# Patient Record
Sex: Female | Born: 1935 | Race: White | Hispanic: No | Marital: Married | Smoking: Never smoker
Health system: Southern US, Community
[De-identification: ages and names within clinical notes are randomized; demographics above are authoritative.]

## PROBLEM LIST (undated history)

## (undated) DIAGNOSIS — I82409 Acute embolism and thrombosis of unspecified deep veins of unspecified lower extremity: Secondary | ICD-10-CM

## (undated) DIAGNOSIS — K449 Diaphragmatic hernia without obstruction or gangrene: Secondary | ICD-10-CM

## (undated) DIAGNOSIS — M1990 Unspecified osteoarthritis, unspecified site: Secondary | ICD-10-CM

## (undated) DIAGNOSIS — E119 Type 2 diabetes mellitus without complications: Secondary | ICD-10-CM

## (undated) DIAGNOSIS — M810 Age-related osteoporosis without current pathological fracture: Secondary | ICD-10-CM

## (undated) DIAGNOSIS — H409 Unspecified glaucoma: Secondary | ICD-10-CM

## (undated) DIAGNOSIS — C50919 Malignant neoplasm of unspecified site of unspecified female breast: Secondary | ICD-10-CM

## (undated) DIAGNOSIS — K259 Gastric ulcer, unspecified as acute or chronic, without hemorrhage or perforation: Secondary | ICD-10-CM

## (undated) DIAGNOSIS — I10 Essential (primary) hypertension: Secondary | ICD-10-CM

## (undated) DIAGNOSIS — C4491 Basal cell carcinoma of skin, unspecified: Secondary | ICD-10-CM

## (undated) HISTORY — PX: PARTIAL COLECTOMY: SHX5273

## (undated) HISTORY — PX: REPLACEMENT TOTAL KNEE: SUR1224

## (undated) HISTORY — DX: Diaphragmatic hernia without obstruction or gangrene: K44.9

## (undated) HISTORY — DX: Age-related osteoporosis without current pathological fracture: M81.0

## (undated) HISTORY — PX: TONSILLECTOMY: SUR1361

## (undated) HISTORY — PX: APPENDECTOMY: SHX54

## (undated) HISTORY — DX: Type 2 diabetes mellitus without complications: E11.9

## (undated) HISTORY — DX: Basal cell carcinoma of skin, unspecified: C44.91

## (undated) HISTORY — DX: Malignant neoplasm of unspecified site of unspecified female breast: C50.919

## (undated) HISTORY — DX: Gastric ulcer, unspecified as acute or chronic, without hemorrhage or perforation: K25.9

## (undated) HISTORY — DX: Unspecified glaucoma: H40.9

## (undated) HISTORY — DX: Unspecified osteoarthritis, unspecified site: M19.90

## (undated) HISTORY — DX: Essential (primary) hypertension: I10

---

## 2004-04-29 ENCOUNTER — Ambulatory Visit: Payer: Self-pay | Admitting: Internal Medicine

## 2004-05-01 ENCOUNTER — Ambulatory Visit: Payer: Self-pay | Admitting: Internal Medicine

## 2004-05-13 ENCOUNTER — Ambulatory Visit: Payer: Self-pay | Admitting: Internal Medicine

## 2004-06-17 ENCOUNTER — Ambulatory Visit: Payer: Self-pay | Admitting: Internal Medicine

## 2004-07-13 ENCOUNTER — Ambulatory Visit: Payer: Self-pay | Admitting: Internal Medicine

## 2005-05-11 ENCOUNTER — Ambulatory Visit: Payer: Self-pay | Admitting: Internal Medicine

## 2006-05-12 ENCOUNTER — Ambulatory Visit: Payer: Self-pay | Admitting: Internal Medicine

## 2006-05-25 ENCOUNTER — Ambulatory Visit: Payer: Self-pay

## 2006-08-05 ENCOUNTER — Ambulatory Visit: Payer: Self-pay | Admitting: Gastroenterology

## 2006-09-08 ENCOUNTER — Inpatient Hospital Stay: Payer: Self-pay | Admitting: Surgery

## 2006-09-10 ENCOUNTER — Other Ambulatory Visit: Payer: Self-pay

## 2006-10-15 ENCOUNTER — Inpatient Hospital Stay: Payer: Self-pay | Admitting: Internal Medicine

## 2006-10-15 ENCOUNTER — Other Ambulatory Visit: Payer: Self-pay

## 2006-10-15 ENCOUNTER — Ambulatory Visit: Payer: Self-pay | Admitting: Internal Medicine

## 2006-12-28 ENCOUNTER — Ambulatory Visit: Payer: Self-pay | Admitting: Emergency Medicine

## 2007-05-17 ENCOUNTER — Ambulatory Visit: Payer: Self-pay | Admitting: Internal Medicine

## 2008-01-09 ENCOUNTER — Ambulatory Visit: Payer: Self-pay | Admitting: Family Medicine

## 2008-04-04 ENCOUNTER — Observation Stay: Payer: Self-pay | Admitting: Internal Medicine

## 2008-09-06 ENCOUNTER — Ambulatory Visit: Payer: Self-pay | Admitting: Internal Medicine

## 2009-09-26 ENCOUNTER — Ambulatory Visit: Payer: Self-pay | Admitting: Internal Medicine

## 2010-11-07 ENCOUNTER — Ambulatory Visit: Payer: Self-pay | Admitting: Unknown Physician Specialty

## 2010-11-19 ENCOUNTER — Ambulatory Visit: Payer: Self-pay | Admitting: Internal Medicine

## 2011-07-14 HISTORY — PX: COLONOSCOPY: SHX174

## 2011-09-25 DIAGNOSIS — M1991 Primary osteoarthritis, unspecified site: Secondary | ICD-10-CM | POA: Insufficient documentation

## 2011-12-25 ENCOUNTER — Ambulatory Visit: Payer: Self-pay | Admitting: Internal Medicine

## 2012-02-10 ENCOUNTER — Ambulatory Visit: Payer: Self-pay | Admitting: Ophthalmology

## 2012-02-10 LAB — PROTIME-INR: Prothrombin Time: 17.3 secs — ABNORMAL HIGH (ref 11.5–14.7)

## 2012-03-07 ENCOUNTER — Ambulatory Visit: Payer: Self-pay | Admitting: Ophthalmology

## 2012-05-18 ENCOUNTER — Ambulatory Visit: Payer: Self-pay | Admitting: Gastroenterology

## 2012-07-13 DIAGNOSIS — C50919 Malignant neoplasm of unspecified site of unspecified female breast: Secondary | ICD-10-CM

## 2012-07-13 HISTORY — DX: Malignant neoplasm of unspecified site of unspecified female breast: C50.919

## 2012-07-13 HISTORY — PX: BREAST BIOPSY: SHX20

## 2012-09-20 ENCOUNTER — Ambulatory Visit: Payer: Self-pay | Admitting: Ophthalmology

## 2012-09-20 LAB — PROTIME-INR
INR: 1.7
Prothrombin Time: 20.1 secs — ABNORMAL HIGH (ref 11.5–14.7)

## 2012-10-03 ENCOUNTER — Ambulatory Visit: Payer: Self-pay | Admitting: Ophthalmology

## 2012-12-08 ENCOUNTER — Ambulatory Visit: Payer: Self-pay | Admitting: Internal Medicine

## 2012-12-12 ENCOUNTER — Ambulatory Visit: Payer: Self-pay | Admitting: Internal Medicine

## 2013-01-06 ENCOUNTER — Ambulatory Visit: Payer: Self-pay | Admitting: Surgery

## 2013-01-06 LAB — PROTIME-INR
INR: 1
Prothrombin Time: 13.7 secs (ref 11.5–14.7)

## 2013-01-10 LAB — PATHOLOGY REPORT

## 2013-01-26 ENCOUNTER — Ambulatory Visit: Payer: Self-pay | Admitting: Surgery

## 2013-01-26 DIAGNOSIS — Z01810 Encounter for preprocedural cardiovascular examination: Secondary | ICD-10-CM

## 2013-01-26 LAB — BASIC METABOLIC PANEL
Anion Gap: 9 (ref 7–16)
Calcium, Total: 10 mg/dL (ref 8.5–10.1)
Co2: 26 mmol/L (ref 21–32)
EGFR (African American): 46 — ABNORMAL LOW
EGFR (Non-African Amer.): 40 — ABNORMAL LOW

## 2013-01-26 LAB — CBC WITH DIFFERENTIAL/PLATELET
Basophil #: 0.1 10*3/uL (ref 0.0–0.1)
Basophil %: 1.1 %
HCT: 37.4 % (ref 35.0–47.0)
HGB: 12.1 g/dL (ref 12.0–16.0)
MCH: 29.1 pg (ref 26.0–34.0)
MCV: 90 fL (ref 80–100)
Monocyte %: 6.8 %
Neutrophil #: 6.2 10*3/uL (ref 1.4–6.5)
Platelet: 245 10*3/uL (ref 150–440)
RDW: 14.6 % — ABNORMAL HIGH (ref 11.5–14.5)
WBC: 8.9 10*3/uL (ref 3.6–11.0)

## 2013-01-26 LAB — PROTIME-INR
INR: 2
Prothrombin Time: 22.7 secs — ABNORMAL HIGH (ref 11.5–14.7)

## 2013-01-31 ENCOUNTER — Ambulatory Visit: Payer: Self-pay | Admitting: Oncology

## 2013-02-02 ENCOUNTER — Ambulatory Visit: Payer: Self-pay | Admitting: Surgery

## 2013-02-02 LAB — PROTIME-INR: INR: 1.1

## 2013-02-03 ENCOUNTER — Ambulatory Visit: Payer: Self-pay | Admitting: Oncology

## 2013-02-10 ENCOUNTER — Ambulatory Visit: Payer: Self-pay | Admitting: Oncology

## 2013-03-13 ENCOUNTER — Ambulatory Visit: Payer: Self-pay | Admitting: Oncology

## 2013-03-31 LAB — CBC CANCER CENTER
Basophil #: 0.1 x10 3/mm (ref 0.0–0.1)
Eosinophil #: 0.2 x10 3/mm (ref 0.0–0.7)
HGB: 13.1 g/dL (ref 12.0–16.0)
MCHC: 34.1 g/dL (ref 32.0–36.0)
MCV: 90 fL (ref 80–100)
Monocyte #: 0.6 x10 3/mm (ref 0.2–0.9)
Monocyte %: 8.2 %
Neutrophil %: 67.8 %
Platelet: 222 x10 3/mm (ref 150–440)
RBC: 4.27 10*6/uL (ref 3.80–5.20)
RDW: 14.6 % — ABNORMAL HIGH (ref 11.5–14.5)

## 2013-04-07 LAB — CBC CANCER CENTER
Basophil #: 0.1 x10 3/mm (ref 0.0–0.1)
Basophil %: 0.9 %
Eosinophil #: 0.5 x10 3/mm (ref 0.0–0.7)
Eosinophil %: 4.9 %
HGB: 13.4 g/dL (ref 12.0–16.0)
Lymphocyte #: 2.1 x10 3/mm (ref 1.0–3.6)
Lymphocyte %: 19.9 %
MCHC: 33.4 g/dL (ref 32.0–36.0)
MCV: 92 fL (ref 80–100)
Monocyte #: 0.9 x10 3/mm (ref 0.2–0.9)
Neutrophil #: 7.2 x10 3/mm — ABNORMAL HIGH (ref 1.4–6.5)
Platelet: 246 x10 3/mm (ref 150–440)
RBC: 4.37 10*6/uL (ref 3.80–5.20)
RDW: 14.5 % (ref 11.5–14.5)
WBC: 10.8 x10 3/mm (ref 3.6–11.0)

## 2013-04-12 ENCOUNTER — Ambulatory Visit: Payer: Self-pay | Admitting: Oncology

## 2013-04-14 LAB — CBC CANCER CENTER
Basophil #: 0.1 x10 3/mm (ref 0.0–0.1)
Basophil %: 1.1 %
Eosinophil %: 4.2 %
HGB: 13.3 g/dL (ref 12.0–16.0)
Lymphocyte #: 1.7 x10 3/mm (ref 1.0–3.6)
Lymphocyte %: 20.2 %
MCH: 30.8 pg (ref 26.0–34.0)
MCHC: 33.7 g/dL (ref 32.0–36.0)
MCV: 91 fL (ref 80–100)
Monocyte #: 0.7 x10 3/mm (ref 0.2–0.9)
Monocyte %: 8.2 %
Neutrophil #: 5.5 x10 3/mm (ref 1.4–6.5)
Platelet: 254 x10 3/mm (ref 150–440)
RBC: 4.31 10*6/uL (ref 3.80–5.20)
RDW: 15.2 % — ABNORMAL HIGH (ref 11.5–14.5)

## 2013-04-21 LAB — CBC CANCER CENTER
Basophil #: 0.1 x10 3/mm (ref 0.0–0.1)
MCH: 31.4 pg (ref 26.0–34.0)
Monocyte %: 8.8 %
Neutrophil #: 6.5 x10 3/mm (ref 1.4–6.5)
Neutrophil %: 70.1 %
Platelet: 233 x10 3/mm (ref 150–440)
RBC: 4.23 10*6/uL (ref 3.80–5.20)

## 2013-05-13 ENCOUNTER — Ambulatory Visit: Payer: Self-pay | Admitting: Oncology

## 2013-06-12 ENCOUNTER — Ambulatory Visit: Payer: Self-pay | Admitting: Oncology

## 2013-08-23 ENCOUNTER — Ambulatory Visit: Payer: Self-pay | Admitting: Oncology

## 2013-09-10 ENCOUNTER — Ambulatory Visit: Payer: Self-pay | Admitting: Oncology

## 2014-01-30 ENCOUNTER — Ambulatory Visit: Payer: Self-pay | Admitting: Radiation Oncology

## 2014-02-16 ENCOUNTER — Ambulatory Visit: Payer: Self-pay | Admitting: Radiation Oncology

## 2014-05-15 ENCOUNTER — Ambulatory Visit: Payer: Self-pay | Admitting: Oncology

## 2014-06-12 ENCOUNTER — Ambulatory Visit: Payer: Self-pay | Admitting: Oncology

## 2014-08-01 ENCOUNTER — Ambulatory Visit: Payer: Self-pay | Admitting: Oncology

## 2014-08-23 ENCOUNTER — Ambulatory Visit: Payer: Self-pay | Admitting: Oncology

## 2014-09-11 ENCOUNTER — Ambulatory Visit
Admit: 2014-09-11 | Disposition: A | Discharge status: Home / Self Care | Payer: Self-pay | Attending: Oncology | Admitting: Oncology

## 2014-10-30 NOTE — Op Note (Signed)
PATIENT NAME:  [name], [name] MR#:  [id number] DATE OF BIRTH:  Feb 22, 1929  DATE OF PROCEDURE:  03/07/2012  PREOPERATIVE DIAGNOSIS: Cataract, left eye.   POSTOPERATIVE DIAGNOSIS: Cataract, left eye.   PROCEDURE PERFORMED: Extracapsular cataract extraction using phacoemulsification with placement Alcon [id number] 18.0-diopter posterior chamber lens with 4.5 diopters of cylinder, serial number [id number].    SURGEON: Loura Back. Jeniffer Culliver, MD    ANESTHESIA: 4% lidocaine and 0.75% Marcaine, 50-50 mixture with 10 units/mL of Hylenex added given as peribulbar.   ANESTHESIOLOGIST: Dr. Benjamine Mola    COMPLICATIONS: None.   ESTIMATED BLOOD LOSS: Less than 1 mL.   DESCRIPTION OF PROCEDURE: The patient was brought to the operating room and had eyes both anesthetized with topical proparacaine. Sitting upright and fixing at a distant target, the 3 and 9 o'clock positions were marked using an ASICO toric marker. The patient was placed supine, given IV sedation and peribulbar block. She was then prepped and draped in the usual fashion. Vertical rectus muscles were imbricated using 5-0 silk sutures, bridle sutures. A limbal peritomy was carried out for one clock hour at 12 o'clock and hemostasis was obtained with cautery. A partial thickness scleral groove was made at the posterior surgical limbus and dissected anteriorly into clear cornea with an Alcon crescent knife. The anterior chamber was entered superotemporally through clear cornea with a paracentesis knife and through the lamellar dissection with a 2.6 mm keratome. Prior to entering the eye, the toric marker was used to mark the 177 degree position. DisCoVisc was used to replace the aqueous and a continuous tear circular capsulorrhexis was carried out. Hydrodelineation was used to loosen the nucleus and phacoemulsification was carried out in a divide-and-conquer technique. Ultrasound time was 1 minute and 20 seconds with an average power of 20.9%, CDE of 28.52.  Irrigation-aspiration was used to remove the residual cortex. The capsular bag was inflated with DisCoVisc and the intraocular lens was inserted in the capsular bag using a [employer]. The haptics were lined up at the 177 degree mark and irrigation-aspiration was used to remove the residual DisCoVisc. The position of the lens was confirmed and the wound was inflated with balanced salt. Miostat was injected in the paracentesis track. There was a small leak at the wound and a single 10-0 nylon suture was placed across the wound. The knot was tied, rotated, and buried superiorly. The wound was checked again with a Weck-Cel and no leaks were found. The conjunctiva was closed with cautery. Bridle sutures were removed. Two drops of Vigamox were placed in the eye and a shield was placed on the eye. The patient was discharged to the recovery room in good condition.   ____________________________ Loura Back Norma Mincey, MD sad:drc D: 03/07/2012 13:44:11 ET T: 03/07/2012 13:53:07 ET JOB#: [id number]  cc: Remo Lipps A. Alainah Phang, MD, <Dictator> [name] MD ELECTRONICALLY SIGNED 03/11/2012 12:40

## 2014-11-02 NOTE — Op Note (Signed)
PATIENT NAME:  [name], [name] MR#:  [id number] DATE OF BIRTH:  1929/03/04  DATE OF PROCEDURE:  02/02/2013  PREOPERATIVE DIAGNOSIS: Right breast carcinoma.   POSTOPERATIVE DIAGNOSIS: Right breast carcinoma.  OPERATION:   1.  Right axillary sentinel lymph node biopsy.  2.  Right partial mastectomy.   SURGEON: Rodena Goldmann, M.D.   ASSISTANTGardiner Sleeper, PA student.   ANESTHESIA: General.   OPERATIVE PROCEDURE: With the patient in the supine position, after the induction of appropriate general anesthesia, the patient's right breast was prepped with ChloraPrep and draped with sterile towels. The axillary area was interrogated, and I could not identify any lymph nodes in that area with the Neoprobe. Then, we made an incision into the axilla and carried it down through the subcutaneous using Bovie electrocautery.   After entering the axilla, we did identify a focus of radioactivity. Careful dissection exposed 1 large lymph node, which was sent for sentinel lymph node biopsy. It returned negative for macro metastasis. The area was packed.   Attention was then turned to the breast where the area identified by the wire was ellipsed right up to the nipple itself. This was carried down around the wire and the wire identified without difficulty. The incision was taken down to the chest wall, passed off without difficulty. The area was irrigated. The incision was closed with interrupted vertical mattress sutures of 4-0 nylon. The axillary incision was closed in running subcuticular suture of 3-0 Vicryl, benzoin, and Steri-Strips. Wounds were dressed sterilely. The patient was returned to the recovery room having tolerated the procedure well. Sponge, instrument, and needle counts were correct x2 in the operating room.    ____________________________ Micheline Maze, MD rle:np D: 02/02/2013 13:42:00 ET T: 02/02/2013 15:17:54 ET JOB#: [id number]  cc: Dr. Oneita Jolly Rodena Goldmann III, MD,  <Dictator>    Rodena Goldmann MD ELECTRONICALLY SIGNED 02/03/2013 20:13

## 2014-11-02 NOTE — Consult Note (Signed)
Reason for Visit: This 81 year old Female patient presents to the clinic for initial evaluation of  breast cancer .   Referred by Dr. Grayland Ormond.  Diagnosis:  Chief Complaint/Diagnosis   81 year old female status post wide local excision of the right breast for 2 separate foci in the same quadrant overall stage I disease (T1 C. N0 M0) with a micrometastatic focus of disease in one of 3 sentinel lymph nodes tumor is ER/PR positive HER-2/neu not overexpressed margins clear but close  Pathology Report pathology report reviewed   Imaging Report mammograms and ultrasound reviewed   Referral Report clinical notes reviewed   Planned Treatment Regimen whole breast radiation   HPI   patient is an 81 year old obese female who presented with a self discovered nodularity in her left right breast. She underwent biopsy which was positive for invasive mammary carcinoma strongly ER/PR positive HER-2/neu not overexpressed. Went on to have a wide local excision with both nodules removed in the same incision. The more medial lesion was 0.9 cm and had a mucinous component. The lateral lesion was 1.8 cm had solid and papillary features. 3 sentinel lymph nodes were sampled one had a micro-met at 1 mm. Margins were clear but close. She has been seen by medical oncology and based on her age and micrometastatic nature of her disease not thought to be a candidate for systemic chemotherapy. She is seen today for consideration of radiation. She is doing fairly well. She is morbidly obese and has difficulty ambulating as well as difficult with arms from bilateral shoulder surgery. She is multiple medical comorbidities. Location of her tumor in the subareolar region does not make her a candidate for MammoSite therapy.  Past Hx:    Chronic Insomnia:    Osteoporosis:    Osteoarthritis:    Metabolic Syndrome:    Colon Mass \{Benign Adenoma\}:    Glaucoma:    Hypertension:    Ulcers, Gastric:    Hiatal Hernia:     Diabetes Mellitus, Type II (NIDD):    Tympanostomy with Myringotomy:    Wisdom Tooth Extraction:    Tonsillectomy and Adenoidectomy:    Colectomy, Partial \{Benign Colon Mass\}:    Appendectomy: childhood  Past, Family and Social History:  Past Medical History positive   Cardiovascular hypertension   Gastrointestinal peptic ulcer disease; hiatal hernia   Endocrine diabetes mellitus   Past Surgical History appendectomy; partial colectomy for polyp benign, tonsillectomy, wisdom tooth extraction, tympanoplasty with myringotomy   Past Medical History Comments insomnia, glaucoma, metabolic syndrome, osteoarthritis, osteoporosis, left knee replacement   Family History positive   Family History Comments mother with adult onset diabetes, hypertension and blood clots. Multiple family members with lung cancer   Social History noncontributory   Additional Past Medical and Surgical History accompanied by her husband today   Allergies:   Amoxicillin: Rash  Fosamax: Unknown  Percocet 10/325: Other, GI Distress  Codeine: GI Distress  Adhesive: Blisters  Home Meds:  Home Medications: Medication Instructions Status  acetaminophen-hydrocodone 325 mg-5 mg oral tablet 1  orally every 6 hours Active  Actonel 150 mg oral tablet 1  orally once a month the 4th or 5th of the month. Active  PriLOSEC 20 mg oral delayed release tablet 1 tab(s) orally once a day, may repeat in evening if needed for GERD. Active  atenolol 25 mg oral tablet 1 tab(s) orally once a day (at bedtime) Active  Actos 30 mg oral tablet 1 tab(s) orally once a day Active  latanoprost ophthalmic  0.005% ophthalmic solution 1 drop(s) to each affected eye once a day (at bedtime)-each eye Active  Colcrys 0.6 mg oral tablet  orally , As Needed Active  Citracal Calcium + D Slow Release 1200 600 mg-500 intl units oral tablet, extended release  orally 2 times a day Active  Aspirin Low Strength 81 mg oral delayed release  tablet 1 tab(s) orally once a day (at bedtime) Active  Dulcolax Stool Softener sodium 100 mg oral capsule 1 cap(s) orally once a day, As Needed Active  folic acid 175 milligram(s) orally once a day (at bedtime) Active  hydrochlorothiazide 12.5 mg oral tablet 1 tab(s) orally 3 times a week, on Monday, Wednesday and Fridays. Active  Januvia 100 mg oral tablet 1 tab(s) orally once a day (at bedtime) Active  lisinopril 30 mg oral tablet 1  orally 2 times a day Active  Osteo Bi-Flex 1 tab(s) 133/330 mg orally 2 times a day Active  multivitamin 1 dose(s) orally once a day, Vitacraves gummy Active  Vitamin D3 1000 intl units oral tablet 1 tab(s) orally once a day Active  warfarin 6 mg oral tablet 1 tab(s) orally once a day (at bedtime) Active   Review of Systems:  General negative   Performance Status (ECOG) 1   Skin negative   Breast see HPI   Ophthalmologic negative   ENMT negative   Respiratory and Thorax negative   Cardiovascular negative   Gastrointestinal negative   Genitourinary negative   Musculoskeletal negative   Neurological negative   Psychiatric negative   Hematology/Lymphatics negative   Endocrine negative   Allergic/Immunologic negative   Nursing Notes:  Nursing Vital Signs and Chemo Nursing Nursing Notes: *CC Vital Signs Flowsheet:   25-Aug-14 13:56  Temp Temperature 98.5  Pulse Pulse 76  Respirations Respirations 19  SBP SBP 173  DBP DBP 89  Current Weight (kg) (kg) 132  Height (cm) centimeters 157  BSA (m2) 2.2   Physical Exam:  General/Skin/HEENT:  Skin normal   Eyes normal   ENMT normal   Head and Neck normal   Additional PE well-developed morbidly obese female in NAD. Lungs are clear to A&P cardiac examination shows regular rate and rhythm. Right breast is status post a wide local excision of the 12:00 position with incision healing well. She also a separate incision in the right axilla. No dominant mass or nodularity is noted in  either breast into position examined. No axillary or supraclavicular adenopathy is appreciated.   Breasts/Resp/CV/GI/GU:  Respiratory and Thorax normal   Cardiovascular normal   Gastrointestinal normal   Genitourinary normal   MS/Neuro/Psych/Lymph:  Musculoskeletal normal   Neurological normal   Lymphatics normal   Other Results:  Radiology Results: [place]:    02-Jun-14 13:42, US Breast Right  US Breast Right   REASON FOR EXAM:    [place] rt brst nodule behind nipple 12 oclock  COMMENTS:       PROCEDURE: [place]  - US BREAST RIGHT  - Dec 12 2012  1:42PM     RESULT:     Findings:  The regions of interest within the retroareolar portion of the   right breast were further evaluated with ultrasound. At the 2:00   position, approximately 2 cm from the nipple, a 2.57 x 2.08 x 1.54 cm,   irregularly bordered, heterogeneous, nodular, mass-like region is   appreciated. There is vascularity appreciated with this finding. This   area appears to reside adjacent to, possibly invade, or originate from a  duct extending from the nipple. This finding has suspicious sonographic   and radiographic characteristics and further evaluation with surgical     consultation is recommended.    A second nodule is identified at the 1:00 position, approximately 2 cm   from the nipple, measuring 1.5 x 0.54 x 0.88 cm. This nodule has a solid   appearance with irregularity of the borders. The sonographic   characteristics of this noduleare indeterminate.    IMPRESSION:      1.  Concerning nodule at the 2:00 position as described above. The   sonographic characteristics appear to correlate with the retroareolar   density described on the additional views. Surgical consultation is   recommended. BI-RADS: Category 4 - Suspicious Abnormality.  2.  Indeterminate nodule at the 1:00 position as described above.   Surgical consultation also recommended.  Thank you for this opportunity to contribute to the care of  your patient.         Verified By: Mikki Santee, M.D., MD  LabUnknown:    29-May-14 14:48, Digital Diagnostic Mammogram Bilateral  PACS Image     02-Jun-14 13:42, US Breast Right  PACS [place]:    29-May-14 14:48, Digital Diagnostic Mammogram Bilateral  Digital Diagnostic Mammogram Bilateral   REASON FOR EXAM:    RT BRST NODULE  BEHIND NIPPLE 12 O CLOCK AND YRLY  COMMENTS:       PROCEDURE: MAM - MAM DGTL DIAGNOSTIC MAMMO W/CAD  - Dec 08 2012  2:48PM     RESULT: Comparison study/studies dated: 12/25/2011, 11/19/2010, 09/26/2009,   09/06/2008  This study was presented for evaluation on 12/12/2012    Findings:    BREAST COMPOSITION: The breast composition is SCATTERED FIBROGLANDULAR   TISSUE (glandular tissue is 25-50%)    The area of asymmetric density within the right breast persists with     magnification compression imaging. Further evaluation with ultrasound is   recommended. This area projects within the inferior medial retroareolar   portion of the breast.    There is no further radiographic evidence to suggest malignancy.    IMPRESSION:  BI-RADS: Category 0 - Need Additional Imaging Evaluation      A NEGATIVE MAMMOGRAM REPORT DOES NOT PRECLUDE BIOPSY OR OTHER EVALUATION   OF A CLINICALLY PALPABLE OR OTHERWISE SUSPICIOUS MASS OR LESION. BREAST   CANCER AY NOT BE DETECTED IN UP TO 10% OF CASES.       Thank you for the oppurtunity to contribute to the care of your patient.    Verified By: Mikki Santee, M.D., MD   Relevent Results:   Relevant Scans and Labs mammograms and ultrasounds are reviewed.   Assessment and Plan: Impression:   stage I invasive mammary carcinoma right breast with 2 separate foci cleared in a single excision in the same quadrant in morbidly obese 81 year old female tumor is ER/PR positive HER-2/neu not overexpressed one sentinel lymph node with a micrometastatic focus of 1 mm Plan:   at this time I have recommended whole  breast radiation. Believe based on her age and overall performance status would be difficult to treat her right peripheral lymphatics as well as indication based on micrometastatic focus in one sentinel node and ER/PR positive patient I do not believe will overalli isntact her overall disease-free survival. Based on her comorbidities and morbid obesity is good be difficult to treatment plan the patient although we will attempt at simulation right after Labor Day weekend. I will plan on  delivering4256 cGy to the right breastin 16 fractions. Patient will also be a candidate for aromatase inhibitor therapy after completion of radiation. Depending on patient's tolerance to treatment may also opt to boost or scar although IM predicting significant problems with patient set up and tolerance of treatment her overall comorbidities and morbid obesity. All this was explained in detail to the patient. Side effects of treatment including skin reaction, fatigue, inclusion of some superficial lungs were all explained in detail to the patient and her husband. They both seem to comprehend my treatment plan well. I have set her up for CT simulation shortly after Labor Day. thanksI would like to take this opportunity to thank you for allowing me to continue to participate in this patient's care.  CC Referral:  cc: Dr. Pat Patrick, Dr. Ramonita Lab   Electronic Signatures: Baruch Gouty, Roda Shutters (MD)  (Signed 25-Aug-14 15:42)  Authored: HPI, Diagnosis, Past Hx, PFSH, Allergies, Home Meds, ROS, Nursing Notes, Physical Exam, Other Results, Relevent Results, Encounter Assessment and Plan, CC Referring Physician   Last Updated: 25-Aug-14 15:42 by Armstead Peaks (MD)

## 2014-11-02 NOTE — Op Note (Signed)
PATIENT NAME:  [name], Norma MR#:  [id number] DATE OF BIRTH:  1929-04-03  DATE OF SURGERY:  10/03/2012  PREOPERATIVE DIAGNOSIS: Cataract, right eye.    POSTOPERATIVE DIAGNOSIS: Cataract, right eye.   PROCEDURE PERFORMED: Extracapsular cataract extraction using phacoemulsification with placement of an Alcon [id number], 19.5-diopter posterior chamber lens, with 4.5 diopters of cylinder, serial #[id number].   SURGEON: Norma Back. Natahsa Marian, M.D.   ANESTHESIA: 4% lidocaine, 0.75% Marcaine, a 50:50 mixture with 10 units/mL of Hylenex added given as a peribulbar.   ANESTHESIOLOGIST: Dr. Benjamine Mola  COMPLICATIONS: None.   ESTIMATED BLOOD LOSS: Less than 1 mL.   DESCRIPTION OF PROCEDURE: The patient was brought to the operating room. She was set upright, and fixing on a distant target, the 3- and 9-o'clock position was marked at the limbus of the right eye. This was done after the eye had been anesthetized with topical proparacaine. The patient was placed supine, given IV sedation and a peribulbar block. She was then prepped and draped in the usual fashion. The 90-degree position was marked and a conjunctival peritomy was carried out for 1 clock hour there. Hemostasis obtained with cautery and a partial-thickness scleral groove was made at the posterior surgical limbus and dissected anteriorly into clear cornea with an Alcon crescent knife. The anterior chamber was entered superonasally through clear cornea with a paracentesis knife and through the lamellar dissection with a 2.6 mm keratome. DisCoVisc was used to replace the aqueous and a continuous-tear circular capsulorrhexis was carried out. Hydrodelineation was used to loosen the nucleus and phacoemulsification was carried out in a divide-and-conquer technique. Ultrasound time was  1 minute, 4 seconds, average power of 24.2%, a CDE of 28.64. Irrigation-aspiration was used to remove the residual cortex. The capsular bag was inflated using DisCoVisc, and the  intraocular lens was inserted using a [employer], academic. Care was taken to align the marks on the base of the haptics with the 1-degree position, which was just slightly nudged beyond the 3- and 9-o'clock marks. Irrigation/aspiration was used to remove the residual DisCoVisc and the position of the lens was checked. The wound was inflated with balanced salt and Miostat was injected into the anterior chamber. The wound was checked for leaks and none were found. The conjunctiva was closed with cautery. The bridle sutures were removed. Three drops of Vigamox were placed in the eye. A shield was placed in the eye. The patient was discharged to the recovery room in good condition.    ____________________________ Norma Back [name], [name] sad:dm D: 10/03/2012 14:12:00 ET T: 10/03/2012 14:35:23 ET JOB#: [id number]  cc: Remo Lipps A. Kristof Nadeem, [name], <Dictator> [name] [name] ELECTRONICALLY SIGNED 10/10/2012 13:39

## 2014-11-28 ENCOUNTER — Encounter: Payer: Self-pay | Admitting: Oncology

## 2014-11-28 ENCOUNTER — Inpatient Hospital Stay: Payer: Medicare Other | Attending: Oncology | Admitting: Oncology

## 2014-11-28 VITALS — BP 124/78 | HR 78 | Temp 97.5°F | Resp 20 | Wt 289.5 lb

## 2014-11-28 DIAGNOSIS — Z170 Estrogen receptor positive status [ER+]: Secondary | ICD-10-CM | POA: Diagnosis not present

## 2014-11-28 DIAGNOSIS — I10 Essential (primary) hypertension: Secondary | ICD-10-CM | POA: Diagnosis not present

## 2014-11-28 DIAGNOSIS — Z7982 Long term (current) use of aspirin: Secondary | ICD-10-CM | POA: Diagnosis not present

## 2014-11-28 DIAGNOSIS — E119 Type 2 diabetes mellitus without complications: Secondary | ICD-10-CM | POA: Insufficient documentation

## 2014-11-28 DIAGNOSIS — Z923 Personal history of irradiation: Secondary | ICD-10-CM | POA: Diagnosis not present

## 2014-11-28 DIAGNOSIS — F419 Anxiety disorder, unspecified: Secondary | ICD-10-CM | POA: Diagnosis not present

## 2014-11-28 DIAGNOSIS — Z79811 Long term (current) use of aromatase inhibitors: Secondary | ICD-10-CM | POA: Diagnosis not present

## 2014-11-28 DIAGNOSIS — M810 Age-related osteoporosis without current pathological fracture: Secondary | ICD-10-CM | POA: Insufficient documentation

## 2014-11-28 DIAGNOSIS — Z79899 Other long term (current) drug therapy: Secondary | ICD-10-CM | POA: Insufficient documentation

## 2014-11-28 DIAGNOSIS — C50911 Malignant neoplasm of unspecified site of right female breast: Secondary | ICD-10-CM | POA: Diagnosis not present

## 2014-11-28 DIAGNOSIS — M1990 Unspecified osteoarthritis, unspecified site: Secondary | ICD-10-CM | POA: Diagnosis not present

## 2014-12-14 DIAGNOSIS — C50919 Malignant neoplasm of unspecified site of unspecified female breast: Secondary | ICD-10-CM | POA: Insufficient documentation

## 2014-12-14 DIAGNOSIS — C50911 Malignant neoplasm of unspecified site of right female breast: Secondary | ICD-10-CM | POA: Insufficient documentation

## 2014-12-14 NOTE — Progress Notes (Signed)
[place]  [phone] [phone]  ID: [name] OB: [birth year]  MR#: [id number]  NHA#:[id number]  Patient Care Team: Adin Hector, MD as PCP - General (Internal Medicine)  CHIEF COMPLAINT:  Chief Complaint  Patient presents with  . Follow-up    6 month follow up breast cancer     INTERVAL HISTORY: Patient returns to clinic today for routine 6 month evaluation. She continues to be highly anxious. She otherwise feels well.  She has no neurologic complaints.  She has a good appetite and denies weight loss.  She has no chest pain or shortness of breath.  She denies any nausea, vomiting, constipation, or diarrhea.  She has no urinary complaints.  Patient offers no further specific complaints today.  REVIEW OF SYSTEMS:   Review of Systems  Constitutional: Negative.   Psychiatric/Behavioral: The patient is nervous/anxious.     As per HPI. Otherwise, a complete review of systems is negatve.  PAST MEDICAL HISTORY: Past Medical History  Diagnosis Date  . Arthritis   . Osteoporosis   . Glaucoma   . Hypertension   . Gastric ulcer   . Hiatal hernia   . Diabetes mellitus without complication   . Breast cancer     PAST SURGICAL HISTORY: Past Surgical History  Procedure Laterality Date  . Appendectomy    . Tonsillectomy    . Replacement total knee    . Mastectomy, partial    . Partial colectomy      FAMILY HISTORY Family History  Problem Relation Age of Onset  . Diabetes Mother   . Hypertension Mother   . Deep vein thrombosis Mother        ADVANCED DIRECTIVES:    HEALTH MAINTENANCE: History  Substance Use Topics  . Smoking status: Not on file  . Smokeless tobacco: Not on file  . Alcohol Use: Not on file     Colonoscopy:  PAP:  Bone density:  Lipid panel:  Not on File  Current Outpatient Prescriptions  Medication Sig Dispense Refill  . allopurinol (ZYLOPRIM) 300 MG tablet Take 300 mg by mouth daily.    Marland Kitchen  aspirin 81 MG tablet Take 81 mg by mouth daily.    Marland Kitchen atenolol (TENORMIN) 25 MG tablet Take by mouth daily.    . Calcium Citrate-Vitamin D (CITRACAL + D PO) Take 1 tablet by mouth 2 (two) times daily.    . cholecalciferol (VITAMIN D) 400 UNITS TABS tablet Take 400 Units by mouth daily.    . colchicine 0.6 MG tablet Take 0.6 mg by mouth daily.    Marland Kitchen docusate sodium (COLACE) 100 MG capsule Take 100 mg by mouth daily as needed for mild constipation.    . folic acid (FOLVITE) 1 MG tablet Take 1 mg by mouth daily.    . hydrochlorothiazide (MICROZIDE) 12.5 MG capsule Take 12.5 mg by mouth daily. 3 times per week on MWF    . HYDROcodone-acetaminophen (NORCO/VICODIN) 5-325 MG per tablet Take 1 tablet by mouth every 6 (six) hours as needed for moderate pain.    Marland Kitchen latanoprost (XALATAN) 0.005 % ophthalmic solution 1 drop at bedtime.    Marland Kitchen letrozole (FEMARA) 2.5 MG tablet Take 2.5 mg by mouth daily.    Marland Kitchen lisinopril (PRINIVIL,ZESTRIL) 30 MG tablet Take 30 mg by mouth 2 (two) times daily.    . Misc Natural Products (OSTEO BI-FLEX ADV DOUBLE ST PO) Take 1 tablet by mouth 2 (two) times daily.    . Multiple  Vitamin (MULTIVITAMIN) capsule Take 1 capsule by mouth daily.    Marland Kitchen omeprazole (PRILOSEC) 20 MG capsule Take 20 mg by mouth daily.    . pioglitazone (ACTOS) 30 MG tablet Take 30 mg by mouth daily.    . risedronate (ACTONEL) 150 MG tablet Take 150 mg by mouth every 30 (thirty) days. with water on empty stomach, nothing by mouth or lie down for next 30 minutes.    . sitaGLIPtin (JANUVIA) 100 MG tablet Take 100 mg by mouth daily.    Marland Kitchen warfarin (COUMADIN) 6 MG tablet Take 6 mg by mouth daily at 6 PM.     No current facility-administered medications for this visit.    OBJECTIVE: Filed Vitals:   11/28/14 1540  BP: 124/78  Pulse: 78  Temp: 97.5 F (36.4 C)  Resp: 20     There is no height on file to calculate BMI.    ECOG FS:0 - Asymptomatic  General: Well-developed, well-nourished, no acute distress. Eyes:  anicteric sclera.  Breasts: Patient refused exam today. Lungs: Clear to auscultation bilaterally. Heart: Regular rate and rhythm. No rubs, murmurs, or gallops. Abdomen: Soft, nontender, nondistended. No organomegaly noted, normoactive bowel sounds. Musculoskeletal: No edema, cyanosis, or clubbing. Neuro: Alert, answering all questions appropriately. Cranial nerves grossly intact. Skin: No rashes or petechiae noted. Psych: Normal affect.   LAB RESULTS:  Lab Results  Component Value Date   NA 137 01/26/2013   K 4.4 01/26/2013   CL 102 01/26/2013   CO2 26 01/26/2013   GLUCOSE 105* 01/26/2013   BUN 27* 01/26/2013   CREATININE 1.27 01/26/2013   CALCIUM 10.0 01/26/2013   GFRNONAA 40* 01/26/2013   GFRAA 46* 01/26/2013    Lab Results  Component Value Date   WBC 9.3 04/21/2013   NEUTROABS 6.5 04/21/2013   HGB 13.3 04/21/2013   HCT 39.0 04/21/2013   MCV 92 04/21/2013   PLT 233 04/21/2013     STUDIES: No results found.  ASSESSMENT: Pathologic stage Ib adenocarcinoma of the right breast.  ER/PR positive, HER-2 negative.  PLAN:    1.  Breast cancer: Given patient's stage of disease she did not require adjuvant chemotherapy.  Oncotype DX has been considered, but given her advanced age chemotherapy may be more detrimental.  Continue letrozole which she will complete in November 2019.  Chief will require repeat mammogram in approximately August 2016. Return to clinic in 6 months for further evaluation.   Patient expressed understanding and was in agreement with this plan.  Patient expressed understanding and was in agreement with this plan. She also understands that She can call clinic at any time with any questions, concerns, or complaints.   Breast cancer   Staging form: Breast, AJCC 7th Edition     Clinical stage from 12/14/2014: Stage IB, (T2, Nmic, M0) - Unsigned   Lloyd Huger, MD   12/14/2014 9:49 AM

## 2014-12-31 DIAGNOSIS — Z6841 Body Mass Index (BMI) 40.0 and over, adult: Secondary | ICD-10-CM | POA: Insufficient documentation

## 2015-01-25 ENCOUNTER — Telehealth: Payer: Self-pay

## 2015-01-25 NOTE — Telephone Encounter (Signed)
Patient is needing a mammogram ordered last one was 02/16/14

## 2015-02-18 ENCOUNTER — Other Ambulatory Visit: Payer: Medicare Other

## 2015-02-18 ENCOUNTER — Ambulatory Visit: Payer: Medicare Other

## 2015-06-03 ENCOUNTER — Inpatient Hospital Stay
Admission: EM | Admit: 2015-06-03 | Discharge: 2015-06-06 | DRG: 355 | Disposition: A | Discharge status: Home Health | Payer: Medicare Other | Attending: Surgery | Admitting: Surgery

## 2015-06-03 DIAGNOSIS — R1033 Periumbilical pain: Secondary | ICD-10-CM

## 2015-06-03 DIAGNOSIS — Z853 Personal history of malignant neoplasm of breast: Secondary | ICD-10-CM

## 2015-06-03 DIAGNOSIS — E119 Type 2 diabetes mellitus without complications: Secondary | ICD-10-CM | POA: Diagnosis present

## 2015-06-03 DIAGNOSIS — R410 Disorientation, unspecified: Secondary | ICD-10-CM | POA: Diagnosis not present

## 2015-06-03 DIAGNOSIS — Y92239 Unspecified place in hospital as the place of occurrence of the external cause: Secondary | ICD-10-CM | POA: Diagnosis not present

## 2015-06-03 DIAGNOSIS — Z23 Encounter for immunization: Secondary | ICD-10-CM

## 2015-06-03 DIAGNOSIS — K436 Other and unspecified ventral hernia with obstruction, without gangrene: Principal | ICD-10-CM | POA: Diagnosis present

## 2015-06-03 DIAGNOSIS — H409 Unspecified glaucoma: Secondary | ICD-10-CM | POA: Diagnosis present

## 2015-06-03 DIAGNOSIS — I10 Essential (primary) hypertension: Secondary | ICD-10-CM | POA: Diagnosis present

## 2015-06-03 DIAGNOSIS — K219 Gastro-esophageal reflux disease without esophagitis: Secondary | ICD-10-CM | POA: Diagnosis present

## 2015-06-03 DIAGNOSIS — M1990 Unspecified osteoarthritis, unspecified site: Secondary | ICD-10-CM | POA: Diagnosis present

## 2015-06-03 DIAGNOSIS — Z96659 Presence of unspecified artificial knee joint: Secondary | ICD-10-CM | POA: Diagnosis present

## 2015-06-03 DIAGNOSIS — K420 Umbilical hernia with obstruction, without gangrene: Secondary | ICD-10-CM

## 2015-06-03 DIAGNOSIS — Z86718 Personal history of other venous thrombosis and embolism: Secondary | ICD-10-CM

## 2015-06-03 DIAGNOSIS — M810 Age-related osteoporosis without current pathological fracture: Secondary | ICD-10-CM | POA: Diagnosis present

## 2015-06-03 DIAGNOSIS — Z7982 Long term (current) use of aspirin: Secondary | ICD-10-CM

## 2015-06-03 DIAGNOSIS — T40605A Adverse effect of unspecified narcotics, initial encounter: Secondary | ICD-10-CM | POA: Diagnosis not present

## 2015-06-03 DIAGNOSIS — Z7901 Long term (current) use of anticoagulants: Secondary | ICD-10-CM

## 2015-06-03 DIAGNOSIS — M1A9XX0 Chronic gout, unspecified, without tophus (tophi): Secondary | ICD-10-CM | POA: Diagnosis present

## 2015-06-03 DIAGNOSIS — Z79899 Other long term (current) drug therapy: Secondary | ICD-10-CM

## 2015-06-03 DIAGNOSIS — C50919 Malignant neoplasm of unspecified site of unspecified female breast: Secondary | ICD-10-CM | POA: Diagnosis present

## 2015-06-03 HISTORY — DX: Acute embolism and thrombosis of unspecified deep veins of unspecified lower extremity: I82.409

## 2015-06-03 LAB — CBC WITH DIFFERENTIAL/PLATELET
BASOS ABS: 0.1 10*3/uL (ref 0–0.1)
BASOS PCT: 1 %
EOS ABS: 0.1 10*3/uL (ref 0–0.7)
EOS PCT: 1 %
HCT: 39.4 % (ref 35.0–47.0)
Hemoglobin: 13 g/dL (ref 12.0–16.0)
Lymphocytes Relative: 10 %
Lymphs Abs: 1.6 10*3/uL (ref 1.0–3.6)
MCH: 30.3 pg (ref 26.0–34.0)
MCHC: 33 g/dL (ref 32.0–36.0)
MCV: 91.9 fL (ref 80.0–100.0)
MONO ABS: 0.6 10*3/uL (ref 0.2–0.9)
Monocytes Relative: 4 %
Neutro Abs: 13 10*3/uL — ABNORMAL HIGH (ref 1.4–6.5)
Neutrophils Relative %: 84 %
PLATELETS: 250 10*3/uL (ref 150–440)
RBC: 4.29 MIL/uL (ref 3.80–5.20)
RDW: 15.3 % — AB (ref 11.5–14.5)
WBC: 15.4 10*3/uL — AB (ref 3.6–11.0)

## 2015-06-03 MED ORDER — HYDROMORPHONE HCL 1 MG/ML IJ SOLN
INTRAMUSCULAR | Status: AC
  Administered 2015-06-03: 1 mg via INTRAVENOUS
  Filled 2015-06-03: qty 1

## 2015-06-03 MED ORDER — SODIUM CHLORIDE 0.9 % IV BOLUS (SEPSIS)
1000.0000 mL | Freq: Once | INTRAVENOUS | Status: AC
  Administered 2015-06-03: 1000 mL via INTRAVENOUS

## 2015-06-03 MED ORDER — SODIUM CHLORIDE 0.9 % IV SOLN
1.0000 g | Freq: Once | INTRAVENOUS | Status: AC
  Administered 2015-06-04: 1 g via INTRAVENOUS
  Filled 2015-06-03: qty 1

## 2015-06-03 MED ORDER — ONDANSETRON HCL 4 MG/2ML IJ SOLN
4.0000 mg | Freq: Once | INTRAMUSCULAR | Status: AC
  Administered 2015-06-03: 4 mg via INTRAVENOUS

## 2015-06-03 MED ORDER — ONDANSETRON HCL 4 MG/2ML IJ SOLN
INTRAMUSCULAR | Status: AC
  Administered 2015-06-03: 4 mg via INTRAVENOUS
  Filled 2015-06-03: qty 2

## 2015-06-03 NOTE — ED Notes (Signed)
Op permit signed by pt.  Report called to kristin rn in OR

## 2015-06-03 NOTE — ED Notes (Signed)
Resumed care from nellie rn.  Pt alert.  Family with pt.  nsr on monitor.  Skin warm and dry.  Iv fluids infusing.

## 2015-06-03 NOTE — ED Notes (Signed)
Pt comes to ED w/ c/o pain in stomach.  Pt sts pain started approx 2 hours ago.  Pt does have umbilical hernia that protrudes out.  Pt sts that pain feels like that it is burning from the legs up.  Pt c/o n/v that began tonight.  Pt had emesis once w/ EMS.

## 2015-06-03 NOTE — H&P (Signed)
CC: Abdominal pain, nausea, unreducible and tender umbilical bulge  HPI: [name] is a pleasant 81 yo F with a history of colectomy for polyps and with a known umbilical hernia who presents with approx 6 hours of abdominal pain centered around umbilicus, hard unreducible umbilical hernia and nausea.  Began acutely.  Similar episode 4 weeks ago but hernia reduced spontaneously with resolution of symptoms.  Was doing well until then.  No fevers/chills, night sweats, shortness of breath, cough, chest pain, diarrhea/constipation, dysuria/hematuria.  Active Ambulatory Problems    Diagnosis Date Noted  . Breast cancer ([place]) 12/14/2014   Resolved Ambulatory Problems    Diagnosis Date Noted  . No Resolved Ambulatory Problems   Past Medical History  Diagnosis Date  . Arthritis   . Osteoporosis   . Glaucoma   . Hypertension   . Gastric ulcer   . Hiatal hernia   . Diabetes mellitus without complication [facility])    Past Surgical History  Procedure Laterality Date  . Appendectomy    . Tonsillectomy    . Replacement total knee    . Mastectomy, partial    . Partial colectomy       Medication List    ASK your doctor about these medications        allopurinol 300 MG tablet  Commonly known as:  ZYLOPRIM  Take 300 mg by mouth daily.     aspirin 81 MG tablet  Take 81 mg by mouth daily.     atenolol 25 MG tablet  Commonly known as:  TENORMIN  Take by mouth daily.     cholecalciferol 400 UNITS Tabs tablet  Commonly known as:  VITAMIN D  Take 400 Units by mouth daily.     CITRACAL + D PO  Take 1 tablet by mouth 2 (two) times daily.     colchicine 0.6 MG tablet  Take 0.6 mg by mouth daily.     docusate sodium 100 MG capsule  Commonly known as:  COLACE  Take 100 mg by mouth daily as needed for mild constipation.     folic acid 1 MG tablet  Commonly known as:  FOLVITE  Take 1 mg by mouth daily.     hydrochlorothiazide 12.5 MG capsule  Commonly known as:  MICROZIDE  Take 12.5 mg  by mouth daily. 3 times per week on MWF     HYDROcodone-acetaminophen 5-325 MG tablet  Commonly known as:  NORCO/VICODIN  Take 1 tablet by mouth every 6 (six) hours as needed for moderate pain.     latanoprost 0.005 % ophthalmic solution  Commonly known as:  XALATAN  1 drop at bedtime.     letrozole 2.5 MG tablet  Commonly known as:  FEMARA  Take 2.5 mg by mouth daily.     lisinopril 30 MG tablet  Commonly known as:  PRINIVIL,ZESTRIL  Take 30 mg by mouth 2 (two) times daily.     multivitamin capsule  Take 1 capsule by mouth daily.     omeprazole 20 MG capsule  Commonly known as:  PRILOSEC  Take 20 mg by mouth daily.     OSTEO BI-FLEX ADV DOUBLE ST PO  Take 1 tablet by mouth 2 (two) times daily.     pioglitazone 30 MG tablet  Commonly known as:  ACTOS  Take 30 mg by mouth daily.     risedronate 150 MG tablet  Commonly known as:  ACTONEL  Take 150 mg by mouth every 30 (thirty) days. with water  on empty stomach, nothing by mouth or lie down for next 30 minutes.     sitaGLIPtin 100 MG tablet  Commonly known as:  JANUVIA  Take 100 mg by mouth daily.     warfarin 6 MG tablet  Commonly known as:  COUMADIN  Take 6 mg by mouth daily at 6 PM.      No Known Allergies   Family History  Problem Relation Age of Onset  . Diabetes Mother   . Hypertension Mother   . Deep vein thrombosis Mother    Social History   Social History  . Marital Status: Married    Spouse Name: N/A  . Number of Children: N/A  . Years of Education: N/A   Occupational History  . Not on file.   Social History Main Topics  . Smoking status: Never Smoker   . Smokeless tobacco: Not on file  . Alcohol Use: Not on file  . Drug Use: Not on file  . Sexual Activity: Not on file   Other Topics Concern  . Not on file   Social History Narrative   ROS: Full ROS obtained, pertinent positives and negatives as above  Blood pressure 187/68, pulse 63, temperature 97.4 F (36.3 C), temperature source  Oral, resp. rate 22, height 5\' 3"  (1.6 m), weight 270 lb (122.471 kg), SpO2 96 %. GEN: Appears uncomfortable, A&Ox3 ABD: soft, tender to palpation around umbilicus, large umbilical hernia, tender, skin changes  Labs: Personally reviewed, significant for  WBC 15.4, other labs pending  Imaging: None  A/P 81 yo with incarcerated umbilcial hernia, concern for strangulation.  As it is unreducible and causing pain, would recommend emergent repair irregardless of what imaging studies may show.  I have discussed procedure with patient and family and its risks and benefits and she agrees with this plan.

## 2015-06-03 NOTE — ED Notes (Signed)
MD at bedside. 

## 2015-06-03 NOTE — ED Notes (Signed)
Pt bib EMS from home w/ c/o abd pain that began today.  Pts stomach distend at umbilicus at site of previous hernia.  Pt had one instance of emesis w/ EMS.  Pt describes pain as burning.

## 2015-06-03 NOTE — ED Provider Notes (Addendum)
[facility] Emergency Department Provider Note  ____________________________________________  Time area: 2250  I have reviewed the triage vital signs and the nursing notes.  History by:  Daughter-in-law, Sheldon Sarpong, who I work with here, and the patient.  HISTORY  Chief Complaint Abdominal Pain and Emesis   HPI [name] is a 81 y.o. female with a known history of an umbilical herniation. She reports that she began to have pain about 3 or 4 hours ago. She was fine earlier in the day, tolerating foods, not having any pain or excessive distention. Now she has pain through her upper abdomen to the periumbilical area. She has nausea and vomiting. She is very uncomfortable with severe pain. On gross exam, I can see a very taut distention in the periumbilical area.  Past Medical History  Diagnosis Date  . Arthritis   . Osteoporosis   . Glaucoma   . Hypertension   . Gastric ulcer   . Hiatal hernia   . Diabetes mellitus without complication ([place])   . Breast cancer [facility])     Patient Active Problem List   Diagnosis Date Noted  . Breast cancer ([place]) 12/14/2014    Past Surgical History  Procedure Laterality Date  . Appendectomy    . Tonsillectomy    . Replacement total knee    . Mastectomy, partial    . Partial colectomy      Current Outpatient Rx  Name  Route  Sig  Dispense  Refill  . allopurinol (ZYLOPRIM) 300 MG tablet   Oral   Take 300 mg by mouth daily.         Marland Kitchen aspirin 81 MG tablet   Oral   Take 81 mg by mouth daily.         Marland Kitchen atenolol (TENORMIN) 25 MG tablet   Oral   Take by mouth daily.         . Calcium Citrate-Vitamin D (CITRACAL + D PO)   Oral   Take 1 tablet by mouth 2 (two) times daily.         . cholecalciferol (VITAMIN D) 400 UNITS TABS tablet   Oral   Take 400 Units by mouth daily.         . colchicine 0.6 MG tablet   Oral   Take 0.6 mg by mouth daily.         Marland Kitchen docusate sodium (COLACE) 100 MG capsule    Oral   Take 100 mg by mouth daily as needed for mild constipation.         . folic acid (FOLVITE) 1 MG tablet   Oral   Take 1 mg by mouth daily.         . hydrochlorothiazide (MICROZIDE) 12.5 MG capsule   Oral   Take 12.5 mg by mouth daily. 3 times per week on MWF         . HYDROcodone-acetaminophen (NORCO/VICODIN) 5-325 MG per tablet   Oral   Take 1 tablet by mouth every 6 (six) hours as needed for moderate pain.         Marland Kitchen latanoprost (XALATAN) 0.005 % ophthalmic solution      1 drop at bedtime.         Marland Kitchen letrozole (FEMARA) 2.5 MG tablet   Oral   Take 2.5 mg by mouth daily.         Marland Kitchen lisinopril (PRINIVIL,ZESTRIL) 30 MG tablet   Oral   Take 30 mg by mouth 2 (  two) times daily.         . Misc Natural Products (OSTEO BI-FLEX ADV DOUBLE ST PO)   Oral   Take 1 tablet by mouth 2 (two) times daily.         . Multiple Vitamin (MULTIVITAMIN) capsule   Oral   Take 1 capsule by mouth daily.         Marland Kitchen omeprazole (PRILOSEC) 20 MG capsule   Oral   Take 20 mg by mouth daily.         . pioglitazone (ACTOS) 30 MG tablet   Oral   Take 30 mg by mouth daily.         . risedronate (ACTONEL) 150 MG tablet   Oral   Take 150 mg by mouth every 30 (thirty) days. with water on empty stomach, nothing by mouth or lie down for next 30 minutes.         . sitaGLIPtin (JANUVIA) 100 MG tablet   Oral   Take 100 mg by mouth daily.         Marland Kitchen warfarin (COUMADIN) 6 MG tablet   Oral   Take 6 mg by mouth daily at 6 PM.           Allergies Review of patient's allergies indicates no known allergies.  Family History  Problem Relation Age of Onset  . Diabetes Mother   . Hypertension Mother   . Deep vein thrombosis Mother     Social History Social History  Substance Use Topics  . Smoking status: Never Smoker   . Smokeless tobacco: None  . Alcohol Use: None    Review of Systems  Constitutional: Negative for fever/chills. ENT: Negative for  congestion. Cardiovascular: Negative for chest pain. Respiratory: Negative for cough. Gastrointestinal: Notable abdominal pain with distention or the periumbilical area. Nausea vomiting. See history of present illness. Genitourinary: Negative for dysuria. Musculoskeletal: No myalgias or injuries. Skin: Negative for rash. Neurological: Negative for headache or focal weakness   10-point ROS otherwise negative.  ____________________________________________   PHYSICAL EXAM:  VITAL SIGNS: ED Triage Vitals  Enc Vitals Group     BP 06/03/15 2244 188/84 mmHg     Pulse Rate 06/03/15 2244 71     Resp --      Temp 06/03/15 2245 97.4 F (36.3 C)     Temp Source 06/03/15 2245 Oral     SpO2 06/03/15 2244 99 %     Weight 06/03/15 2245 270 lb (122.471 kg)     Height 06/03/15 2245 5\' 3"  (1.6 m)     Head Cir --      Peak Flow --      Pain Score 06/03/15 2244 10     Pain Loc --      Pain Edu? --      Excl. in [place]? --     Constitutional: Alert and communicative. Patient is moaning. She is holding an emesis bag. She appears very uncomfortable. ENT   Head: Normocephalic and atraumatic. Cardiovascular: Normal rate, regular rhythm, no murmur noted Respiratory:  Normal respiratory effort, no tachypnea.    Breath sounds are clear and equal bilaterally.  Gastrointestinal: There is a very firm distended area and the periumbilical sound. This feels nodular. I have attempted reduction, and my efforts appear futile. I can feel a little bit of gas gurgling through one firm loop, but in general there are no bowel sounds through this area. She has minimal discomfort in the upper abdomen..  Back: No  muscle spasm, no tenderness, no CVA tenderness. Musculoskeletal: No deformity noted. Nontender with normal range of motion in all extremities.  No noted edema. Neurologic:  Communicative. Normal appearing spontaneous movement in all 4 extremities. No gross focal neurologic deficits are appreciated.  Skin:   Skin is warm, dry. No rash noted. Psychiatric: Mood and affect are normal. Speech and behavior are normal.  ____________________________________________    LABS (pertinent positives/negatives) Labs Reviewed  CBC WITH DIFFERENTIAL/PLATELET - Abnormal; Notable for the following:    WBC 15.4 (*)    RDW 15.3 (*)    Neutro Abs 13.0 (*)    All other components within normal limits  COMPREHENSIVE METABOLIC PANEL  URINALYSIS COMPLETEWITH MICROSCOPIC ([facility] ONLY)  LIPASE, BLOOD     ____________________________________________  PROCEDURES  CRITICAL CARE Performed by: Ahmed Prima   Total critical care time: 30 minutes due to the acute issues of the abdomen, consultation with the family, discussion with the general surgeon, initiation of antibiotics, and medicines for pain control and nausea.  Critical care time was exclusive of separately billable procedures and treating other patients.  Critical care was necessary to treat or prevent imminent or life-threatening deterioration.  Critical care was time spent personally by me on the following activities: development of treatment plan with patient and/or surrogate as well as nursing, discussions with consultants, evaluation of patient's response to treatment, examination of patient, obtaining history from patient or surrogate, ordering and performing treatments and interventions, ordering and review of laboratory studies, ordering and review of radiographic studies, pulse oximetry and re-evaluation of patient's condition.   ____________________________________________   INITIAL IMPRESSION / ASSESSMENT AND PLAN / ED COURSE  Pertinent labs & imaging results that were available during my care of the patient were reviewed by me and considered in my medical decision making (see chart for details).  81 year old lady who appears very uncomfortable with abdominal pain, nausea and vomiting. She has a firm distended . Umbilical area and a  history of a umbilical hernia.  I am not able to make any leeway in reducing what I suspect is an incarcerated umbilical hernia. I have discussed this with the patient and her daughter-in-law, Zareen Adelsberger. I have called and spoken with Dr. Concha Pyo who will come and see the patient. He agrees to hold off on any imaging at this time.  ----------------------------------------- 11:29 PM on 06/03/2015 -----------------------------------------  Patient has been seen by Dr. Ruthe Mannan who plans on taking the patient the operating room for this acute abdominal issue, which is likely an incarcerated umbilical hernia.  He is asked me to initiate antibiotics with Invanz. 1 g has been ordered.  Patient has received small doses of Dilaudid for pain control. She is a little bit more comfortable currently.  ____________________________________________   FINAL CLINICAL IMPRESSION(S) / ED DIAGNOSES  Final diagnoses:  Periumbilical abdominal pain  Umbilical hernia, incarcerated      Ahmed Prima, MD 06/03/15 [id number]  Ahmed Prima, MD 06/03/15 2355

## 2015-06-04 ENCOUNTER — Emergency Department: Payer: Medicare Other | Admitting: Anesthesiology

## 2015-06-04 ENCOUNTER — Encounter
Admission: EM | Disposition: A | Discharge status: Home Health | Payer: Self-pay | Source: Home / Self Care | Attending: Surgery

## 2015-06-04 ENCOUNTER — Encounter: Payer: Self-pay | Admitting: Anesthesiology

## 2015-06-04 DIAGNOSIS — T40605A Adverse effect of unspecified narcotics, initial encounter: Secondary | ICD-10-CM | POA: Diagnosis not present

## 2015-06-04 DIAGNOSIS — Y92239 Unspecified place in hospital as the place of occurrence of the external cause: Secondary | ICD-10-CM | POA: Diagnosis not present

## 2015-06-04 DIAGNOSIS — E119 Type 2 diabetes mellitus without complications: Secondary | ICD-10-CM | POA: Diagnosis not present

## 2015-06-04 DIAGNOSIS — K436 Other and unspecified ventral hernia with obstruction, without gangrene: Secondary | ICD-10-CM | POA: Diagnosis present

## 2015-06-04 DIAGNOSIS — Z86718 Personal history of other venous thrombosis and embolism: Secondary | ICD-10-CM | POA: Diagnosis not present

## 2015-06-04 DIAGNOSIS — H409 Unspecified glaucoma: Secondary | ICD-10-CM | POA: Diagnosis not present

## 2015-06-04 DIAGNOSIS — Z7982 Long term (current) use of aspirin: Secondary | ICD-10-CM | POA: Diagnosis not present

## 2015-06-04 DIAGNOSIS — M1990 Unspecified osteoarthritis, unspecified site: Secondary | ICD-10-CM | POA: Diagnosis not present

## 2015-06-04 DIAGNOSIS — I10 Essential (primary) hypertension: Secondary | ICD-10-CM | POA: Diagnosis not present

## 2015-06-04 DIAGNOSIS — C50919 Malignant neoplasm of unspecified site of unspecified female breast: Secondary | ICD-10-CM | POA: Diagnosis not present

## 2015-06-04 DIAGNOSIS — K420 Umbilical hernia with obstruction, without gangrene: Secondary | ICD-10-CM

## 2015-06-04 DIAGNOSIS — K219 Gastro-esophageal reflux disease without esophagitis: Secondary | ICD-10-CM | POA: Diagnosis not present

## 2015-06-04 DIAGNOSIS — Z96659 Presence of unspecified artificial knee joint: Secondary | ICD-10-CM | POA: Diagnosis not present

## 2015-06-04 DIAGNOSIS — Z23 Encounter for immunization: Secondary | ICD-10-CM | POA: Diagnosis not present

## 2015-06-04 DIAGNOSIS — M810 Age-related osteoporosis without current pathological fracture: Secondary | ICD-10-CM | POA: Diagnosis not present

## 2015-06-04 DIAGNOSIS — Z79899 Other long term (current) drug therapy: Secondary | ICD-10-CM | POA: Diagnosis not present

## 2015-06-04 DIAGNOSIS — M1A9XX0 Chronic gout, unspecified, without tophus (tophi): Secondary | ICD-10-CM | POA: Diagnosis not present

## 2015-06-04 DIAGNOSIS — Z7901 Long term (current) use of anticoagulants: Secondary | ICD-10-CM | POA: Diagnosis not present

## 2015-06-04 DIAGNOSIS — Z853 Personal history of malignant neoplasm of breast: Secondary | ICD-10-CM | POA: Diagnosis not present

## 2015-06-04 DIAGNOSIS — R410 Disorientation, unspecified: Secondary | ICD-10-CM | POA: Diagnosis not present

## 2015-06-04 HISTORY — PX: VENTRAL HERNIA REPAIR: SHX424

## 2015-06-04 LAB — COMPREHENSIVE METABOLIC PANEL
ALBUMIN: 3.9 g/dL (ref 3.5–5.0)
ALBUMIN: 3.3 g/dL — AB (ref 3.5–5.0)
ALT: 26 U/L (ref 14–54)
ALT: 23 U/L (ref 14–54)
AST: 29 U/L (ref 15–41)
AST: 29 U/L (ref 15–41)
Alkaline Phosphatase: 67 U/L (ref 38–126)
Alkaline Phosphatase: 57 U/L (ref 38–126)
Anion gap: 10 (ref 5–15)
Anion gap: 8 (ref 5–15)
BUN: 26 mg/dL — AB (ref 6–20)
BUN: 22 mg/dL — AB (ref 6–20)
CHLORIDE: 96 mmol/L — AB (ref 101–111)
CHLORIDE: 98 mmol/L — AB (ref 101–111)
CO2: 25 mmol/L (ref 22–32)
CO2: 26 mmol/L (ref 22–32)
CREATININE: 1.27 mg/dL — AB (ref 0.44–1.00)
Calcium: 9.6 mg/dL (ref 8.9–10.3)
Calcium: 8.7 mg/dL — ABNORMAL LOW (ref 8.9–10.3)
Creatinine, Ser: 1.11 mg/dL — ABNORMAL HIGH (ref 0.44–1.00)
GFR calc Af Amer: 44 mL/min — ABNORMAL LOW (ref >60)
GFR calc Af Amer: 51 mL/min — ABNORMAL LOW (ref >60)
GFR, EST NON AFRICAN AMERICAN: 38 mL/min — AB (ref >60)
GFR, EST NON AFRICAN AMERICAN: 44 mL/min — AB (ref >60)
Glucose, Bld: 261 mg/dL — ABNORMAL HIGH (ref 65–99)
Glucose, Bld: 204 mg/dL — ABNORMAL HIGH (ref 65–99)
POTASSIUM: 4.4 mmol/L (ref 3.5–5.1)
POTASSIUM: 4.1 mmol/L (ref 3.5–5.1)
SODIUM: 131 mmol/L — AB (ref 135–145)
SODIUM: 132 mmol/L — AB (ref 135–145)
Total Bilirubin: 0.2 mg/dL — ABNORMAL LOW (ref 0.3–1.2)
Total Bilirubin: 0.4 mg/dL (ref 0.3–1.2)
Total Protein: 7.5 g/dL (ref 6.5–8.1)
Total Protein: 6.9 g/dL (ref 6.5–8.1)

## 2015-06-04 LAB — URINALYSIS COMPLETE WITH MICROSCOPIC (ARMC ONLY)
BACTERIA UA: NONE SEEN
Bilirubin Urine: NEGATIVE
Glucose, UA: NEGATIVE mg/dL
KETONES UR: NEGATIVE mg/dL
NITRITE: NEGATIVE
PH: 6 (ref 5.0–8.0)
PROTEIN: 30 mg/dL — AB
SPECIFIC GRAVITY, URINE: 1.013 (ref 1.005–1.030)

## 2015-06-04 LAB — PROTIME-INR
INR: 2.06
INR: 2.18
Prothrombin Time: 23.1 seconds — ABNORMAL HIGH (ref 11.4–15.0)
Prothrombin Time: 24.1 seconds — ABNORMAL HIGH (ref 11.4–15.0)

## 2015-06-04 LAB — CBC
HEMATOCRIT: 37.4 % (ref 35.0–47.0)
Hemoglobin: 12.1 g/dL (ref 12.0–16.0)
MCH: 29.9 pg (ref 26.0–34.0)
MCHC: 32.5 g/dL (ref 32.0–36.0)
MCV: 92.1 fL (ref 80.0–100.0)
Platelets: 249 10*3/uL (ref 150–440)
RBC: 4.06 MIL/uL (ref 3.80–5.20)
RDW: 14.8 % — AB (ref 11.5–14.5)
WBC: 14.3 10*3/uL — AB (ref 3.6–11.0)

## 2015-06-04 LAB — GLUCOSE, CAPILLARY
GLUCOSE-CAPILLARY: 173 mg/dL — AB (ref 65–99)
GLUCOSE-CAPILLARY: 143 mg/dL — AB (ref 65–99)
GLUCOSE-CAPILLARY: 144 mg/dL — AB (ref 65–99)
Glucose-Capillary: 198 mg/dL — ABNORMAL HIGH (ref 65–99)
Glucose-Capillary: 222 mg/dL — ABNORMAL HIGH (ref 65–99)
Glucose-Capillary: 173 mg/dL — ABNORMAL HIGH (ref 65–99)

## 2015-06-04 LAB — LIPASE, BLOOD: LIPASE: 36 U/L (ref 11–51)

## 2015-06-04 SURGERY — REPAIR, HERNIA, VENTRAL
Anesthesia: General | Wound class: Clean

## 2015-06-04 MED ORDER — PHENYLEPHRINE HCL 10 MG/ML IJ SOLN: INTRAMUSCULAR | Status: DC | PRN

## 2015-06-04 MED ORDER — PROPOFOL 10 MG/ML IV BOLUS
INTRAVENOUS | Status: DC | PRN
  Administered 2015-06-04: 150 mg via INTRAVENOUS

## 2015-06-04 MED ORDER — ACETAMINOPHEN 650 MG RE SUPP
650.0000 mg | Freq: Four times a day (QID) | RECTAL | Status: DC | PRN
Start: 2015-06-04 — End: 2015-06-05
  Filled 2015-06-04: qty 1

## 2015-06-04 MED ORDER — MIDAZOLAM HCL 2 MG/2ML IJ SOLN
INTRAMUSCULAR | Status: DC | PRN
  Administered 2015-06-04: 2 mg via INTRAVENOUS

## 2015-06-04 MED ORDER — LISINOPRIL 20 MG PO TABS
30.0000 mg | ORAL_TABLET | Freq: Two times a day (BID) | ORAL | Status: DC
  Administered 2015-06-04 – 2015-06-06 (×5): 30 mg via ORAL
  Filled 2015-06-04 (×6): qty 1

## 2015-06-04 MED ORDER — PHENYLEPHRINE HCL 10 MG/ML IJ SOLN
INTRAMUSCULAR | Status: DC | PRN
  Administered 2015-06-04: 50 ug via INTRAVENOUS

## 2015-06-04 MED ORDER — GLYCOPYRROLATE 0.2 MG/ML IJ SOLN
INTRAMUSCULAR | Status: DC | PRN
  Administered 2015-06-04: 0.4 mg via INTRAVENOUS

## 2015-06-04 MED ORDER — ALLOPURINOL 150 MG HALF TABLET
150.0000 mg | ORAL_TABLET | Freq: Every day | ORAL | Status: DC
  Administered 2015-06-04 – 2015-06-06 (×3): 150 mg via ORAL
  Filled 2015-06-04 (×3): qty 1

## 2015-06-04 MED ORDER — LACTATED RINGERS IV SOLN
INTRAVENOUS | Status: DC | PRN
  Administered 2015-06-04: 01:00:00 via INTRAVENOUS

## 2015-06-04 MED ORDER — FENTANYL CITRATE (PF) 100 MCG/2ML IJ SOLN
25.0000 ug | INTRAMUSCULAR | Status: AC | PRN
  Administered 2015-06-04 (×6): 25 ug via INTRAVENOUS

## 2015-06-04 MED ORDER — SODIUM CHLORIDE 0.9 % IV SOLN
INTRAVENOUS | Status: DC
  Administered 2015-06-04 (×2): via INTRAVENOUS

## 2015-06-04 MED ORDER — ACETAMINOPHEN 325 MG PO TABS
650.0000 mg | ORAL_TABLET | Freq: Four times a day (QID) | ORAL | Status: DC | PRN
Start: 2015-06-04 — End: 2015-06-05

## 2015-06-04 MED ORDER — FENTANYL CITRATE (PF) 100 MCG/2ML IJ SOLN
INTRAMUSCULAR | Status: AC
  Filled 2015-06-04: qty 2

## 2015-06-04 MED ORDER — FENTANYL CITRATE (PF) 100 MCG/2ML IJ SOLN
INTRAMUSCULAR | Status: DC | PRN
  Administered 2015-06-04 (×2): 50 ug via INTRAVENOUS

## 2015-06-04 MED ORDER — ACETAMINOPHEN 10 MG/ML IV SOLN
INTRAVENOUS | Status: DC | PRN
  Administered 2015-06-04: 1000 mg via INTRAVENOUS

## 2015-06-04 MED ORDER — LIDOCAINE HCL (CARDIAC) 20 MG/ML IV SOLN
INTRAVENOUS | Status: DC | PRN
  Administered 2015-06-04: 40 mg via INTRAVENOUS

## 2015-06-04 MED ORDER — ACETAMINOPHEN 10 MG/ML IV SOLN
INTRAVENOUS | Status: AC
  Filled 2015-06-04: qty 100

## 2015-06-04 MED ORDER — COLCHICINE 0.6 MG PO TABS
0.6000 mg | ORAL_TABLET | Freq: Every evening | ORAL | Status: DC
  Filled 2015-06-04 (×2): qty 1

## 2015-06-04 MED ORDER — ONDANSETRON HCL 4 MG/2ML IJ SOLN
4.0000 mg | Freq: Four times a day (QID) | INTRAMUSCULAR | Status: DC | PRN
  Administered 2015-06-04: 4 mg via INTRAVENOUS
  Filled 2015-06-04: qty 2

## 2015-06-04 MED ORDER — SUCCINYLCHOLINE CHLORIDE 20 MG/ML IJ SOLN
INTRAMUSCULAR | Status: DC | PRN
  Administered 2015-06-04: 120 mg via INTRAVENOUS

## 2015-06-04 MED ORDER — ATENOLOL 50 MG PO TABS
25.0000 mg | ORAL_TABLET | Freq: Every day | ORAL | Status: DC
  Administered 2015-06-04 – 2015-06-06 (×3): 25 mg via ORAL
  Filled 2015-06-04 (×3): qty 1

## 2015-06-04 MED ORDER — ONDANSETRON HCL 4 MG/2ML IJ SOLN: 4.0000 mg | Freq: Once | INTRAMUSCULAR | Status: DC | PRN

## 2015-06-04 MED ORDER — LATANOPROST 0.005 % OP SOLN
1.0000 [drp] | Freq: Every day | OPHTHALMIC | Status: DC
  Administered 2015-06-04 – 2015-06-05 (×2): 1 [drp] via OPHTHALMIC
  Filled 2015-06-04: qty 2.5

## 2015-06-04 MED ORDER — HYDRALAZINE HCL 20 MG/ML IJ SOLN: 10.0000 mg | INTRAMUSCULAR | Status: DC | PRN

## 2015-06-04 MED ORDER — PANTOPRAZOLE SODIUM 40 MG IV SOLR
40.0000 mg | Freq: Every day | INTRAVENOUS | Status: DC
  Administered 2015-06-04: 40 mg via INTRAVENOUS
  Filled 2015-06-04: qty 40

## 2015-06-04 MED ORDER — INSULIN ASPART 100 UNIT/ML ~~LOC~~ SOLN
0.0000 [IU] | SUBCUTANEOUS | Status: DC
  Administered 2015-06-04: 3 [IU] via SUBCUTANEOUS
  Administered 2015-06-04 (×2): 1 [IU] via SUBCUTANEOUS
  Administered 2015-06-04 – 2015-06-05 (×7): 2 [IU] via SUBCUTANEOUS
  Administered 2015-06-06: 1 [IU] via SUBCUTANEOUS
  Administered 2015-06-06 (×3): 2 [IU] via SUBCUTANEOUS
  Filled 2015-06-04: qty 2
  Filled 2015-06-04 (×2): qty 1
  Filled 2015-06-04: qty 2
  Filled 2015-06-04: qty 1
  Filled 2015-06-04 (×8): qty 2
  Filled 2015-06-04: qty 3
  Filled 2015-06-04 (×2): qty 2

## 2015-06-04 MED ORDER — ONDANSETRON 4 MG PO TBDP: 4.0000 mg | ORAL_TABLET | Freq: Four times a day (QID) | ORAL | Status: DC | PRN

## 2015-06-04 MED ORDER — NEOSTIGMINE METHYLSULFATE 10 MG/10ML IV SOLN
INTRAVENOUS | Status: DC | PRN
  Administered 2015-06-04: 2 mg via INTRAVENOUS

## 2015-06-04 MED ORDER — ROCURONIUM BROMIDE 100 MG/10ML IV SOLN
INTRAVENOUS | Status: DC | PRN
  Administered 2015-06-04: 20 mg via INTRAVENOUS
  Administered 2015-06-04: 5 mg via INTRAVENOUS

## 2015-06-04 MED ORDER — HYDROMORPHONE HCL 1 MG/ML IJ SOLN
0.5000 mg | INTRAMUSCULAR | Status: DC | PRN
  Administered 2015-06-03 – 2015-06-04 (×2): 1 mg via INTRAVENOUS
  Administered 2015-06-04: 0.5 mg via INTRAVENOUS
  Administered 2015-06-04 – 2015-06-05 (×3): 1 mg via INTRAVENOUS
  Filled 2015-06-04 (×6): qty 1

## 2015-06-04 MED ORDER — LETROZOLE 2.5 MG PO TABS
2.5000 mg | ORAL_TABLET | Freq: Every day | ORAL | Status: DC
  Administered 2015-06-04 – 2015-06-06 (×3): 2.5 mg via ORAL
  Filled 2015-06-04 (×3): qty 1

## 2015-06-04 MED ORDER — ONDANSETRON HCL 4 MG/2ML IJ SOLN
INTRAMUSCULAR | Status: DC | PRN
  Administered 2015-06-04: 4 mg via INTRAVENOUS

## 2015-06-04 MED ORDER — HYDROCHLOROTHIAZIDE 12.5 MG PO CAPS: 12.5000 mg | ORAL_CAPSULE | ORAL | Status: DC

## 2015-06-04 SURGICAL SUPPLY — 27 items
CANISTER SUCT 1200ML W/VALVE (MISCELLANEOUS) IMPLANT
CANISTER SUCT 3000ML PPV (MISCELLANEOUS) ×3 IMPLANT
CHLORAPREP W/TINT 26ML (MISCELLANEOUS) ×3 IMPLANT
DRAPE LAPAROTOMY 100X77 ABD (DRAPES) ×3 IMPLANT
DRSG OPSITE POSTOP 4X10 (GAUZE/BANDAGES/DRESSINGS) ×3 IMPLANT
GAUZE SPONGE 4X4 12PLY STRL (GAUZE/BANDAGES/DRESSINGS) IMPLANT
GLOVE BIO SURGEON STRL SZ7.5 (GLOVE) ×12 IMPLANT
GOWN STRL REUS W/ TWL LRG LVL3 (GOWN DISPOSABLE) ×2 IMPLANT
GOWN STRL REUS W/TWL LRG LVL3 (GOWN DISPOSABLE) ×4
HOLDER FOLEY CATH W/STRAP (MISCELLANEOUS) ×3 IMPLANT
JACKSON PRATT 10 (INSTRUMENTS) IMPLANT
LABEL OR SOLS (LABEL) IMPLANT
NS IRRIG 500ML POUR BTL (IV SOLUTION) ×3 IMPLANT
PACK BASIN MAJOR ARMC (MISCELLANEOUS) ×3 IMPLANT
PAD GROUND ADULT SPLIT (MISCELLANEOUS) ×3 IMPLANT
RETAINER VISCERA MED (MISCELLANEOUS) IMPLANT
SLEEVE PROTECTION STRL DISP (MISCELLANEOUS) ×3 IMPLANT
STAPLER SKIN PROX 35W (STAPLE) ×3 IMPLANT
SUT ETHIBOND 0 MO6 C/R (SUTURE) IMPLANT
SUT ETHILON 3-0 FS-10 30 BLK (SUTURE)
SUT PDS AB 1 TP1 96 (SUTURE) ×6 IMPLANT
SUT PROLENE 0 CT 1 30 (SUTURE) IMPLANT
SUT VIC AB 3-0 SH 27 (SUTURE)
SUT VIC AB 3-0 SH 27X BRD (SUTURE) IMPLANT
SUT VICRYL+ 3-0 144IN (SUTURE) IMPLANT
SUTURE EHLN 3-0 FS-10 30 BLK (SUTURE) IMPLANT
TRAY FOLEY CATH SILVER 16FR LF (SET/KITS/TRAYS/PACK) ×3 IMPLANT

## 2015-06-04 NOTE — Transfer of Care (Signed)
Immediate Anesthesia Transfer of Care Note  Patient: [name]  Procedure(s) Performed: Procedure(s):  REPAIR of INCARCERATED VENTRAL HERNIA (N/A)  Patient Location: PACU  Anesthesia Type:General  Level of Consciousness: awake, alert , oriented and sedated  Airway & Oxygen Therapy: Patient Spontanous Breathing and Patient connected to face mask oxygen  Post-op Assessment: Report given to RN and Post -op Vital signs reviewed and stable  Post vital signs: Reviewed and stable  Last Vitals:  Filed Vitals:   06/03/15 2245 06/03/15 2300  BP: 188/84 187/68  Pulse: 69 63  Temp: 36.3 C   Resp:  22    Complications: No apparent anesthesia complications

## 2015-06-04 NOTE — Op Note (Signed)
Preop diagnosis: incarcerated ventral hernia Postop diagnosis: Same Procedure performed: Repair of incarcerated ventral hernia Anesthesia: GETA EBL: 25 ml Specimen: None Drains: None  Indication for Surgery: Ms. Razza is a pleasant 81 yo F who presents with acute onset nausea/vomiting and physical exam consistent with incarcerated ventral hernia.  She was brought to the OR for surgical repair of incarcerated ventral hernia.  Details of Procedure.  Informed consent was obtained.  Ms Buetow was brought to the OR suite and laid supine on the OR table.  She was induced, ETT was placed and general anesthesia was administered.  Her abdomen was prepped and draped.  A midline incision was made cephalad to the large irreducible mass.  It was deepened down to the fascia.  The hernia was encountered and attempted to be encircled, however, it was very large and was opened with the release of significant clear fluid.  The midline incision was then lengthened cephalad until I was able to evaluate all herniated bowel.  It was distended and fluid filled but still appeared viable and with minimal bruising and color change.  The hernia was chronic and therefore I had to free bowel and omentum adherent to the hernia sac to reduce it.  In addition, the fascia had to be opened cephalad to reduce it.  There was a large, thick chronic sac which I did not attempt to remove because I thought that doing so would result in bleeding in a patient on blood thinners.  The fascial defect was approx 6 cm long but was able to come together easily and without tension.  I reexamined the bowel and the hernia sac and omentum to ensure that there was hemostasis.  The fascia was then closed with a single running looped #1 PDS tied at one end.  The skin was then closed with staples and a sterile dressing was placed over the wound.  The patient was then awoken, extubated and transferred to the PACU.  There were no immediate complications.  Needle,  sponge and instrument count was correct at the end of the procedure.

## 2015-06-04 NOTE — Progress Notes (Signed)
Surgery Progress Note  S:  Still with pain but improved O:Blood pressure 136/40, pulse 92, temperature 98.7 F (37.1 C), temperature source Oral, resp. rate 18, height 5\' 2"  (1.575 m), weight 279 lb 12.8 oz (126.916 kg), SpO2 97 %. GEN: NAD/A&Ox3 ABD: soft, approp tender, nondistended, incision c/d/i   WBC 14.3 Hgb 12.1 INR 2.06  A/P 81 yo s/p repair of incarcerated ventral hernia, doing well - liquids - appreciate IM input - PT - IV pain meds

## 2015-06-04 NOTE — Anesthesia Preprocedure Evaluation (Addendum)
Anesthesia Evaluation  Patient identified by MRN, date of birth, ID band Patient awake    Reviewed: Allergy & Precautions, NPO status , Patient's Chart, lab work & pertinent test results, reviewed documented beta blocker date and time   Airway Mallampati: III  TM Distance: >3 FB     Dental  (+) Chipped   Pulmonary           Cardiovascular hypertension, Pt. on medications and Pt. on home beta blockers      Neuro/Psych  Neuromuscular disease    GI/Hepatic hiatal hernia, PUD,   Endo/Other  diabetes, Type 2Morbid obesity  Renal/GU      Musculoskeletal  (+) Arthritis ,   Abdominal   Peds  Hematology   Anesthesia Other Findings Gout.  Reproductive/Obstetrics                            Anesthesia Physical Anesthesia Plan  ASA: III  Anesthesia Plan: General   Post-op Pain Management:    Induction: Intravenous, Rapid sequence and Cricoid pressure planned  Airway Management Planned: Oral ETT  Additional Equipment:   Intra-op Plan:   Post-operative Plan:   Informed Consent: I have reviewed the patients History and Physical, chart, labs and discussed the procedure including the risks, benefits and alternatives for the proposed anesthesia with the patient or authorized representative who has indicated his/her understanding and acceptance.     Plan Discussed with: CRNA  Anesthesia Plan Comments:        Anesthesia Quick Evaluation

## 2015-06-04 NOTE — Consult Note (Signed)
[facility] HOSPITALIST  Medical Consultation  KATI SHAFER [id number] DOB: [birth year] DOA: 06/03/2015 PCP: Adin Hector, MD   Requesting physician:  Marlyce Huge M.D. Date of consultation: 06/04/2015 Reason for consultation: Medical opinion regarding patient's diabetes, hypertension, history of DVT  CHIEF COMPLAINT:   Chief Complaint  Patient presents with  . Abdominal Pain  . Emesis    HISTORY OF PRESENT ILLNESS: [name]  is a 81 y.o. female with a known history of recurrent DVT , hypertension, and diabetes type 2. Who was admitted with abdominal pain and emesis by surgery. She was noted to have incarcerated ventral hernia she underwent repair of this. She does complain of abdominal discomfort but no nausea or vomiting or diarrhea. She denies any fevers or chills no chest pain or shortness of breath.   PAST MEDICAL HISTORY:   Past Medical History  Diagnosis Date  . Arthritis   . Osteoporosis   . Glaucoma   . Hypertension   . Gastric ulcer   . Hiatal hernia   . Diabetes mellitus without complication ([place])   . Breast cancer ([place])     PAST SURGICAL HISTORY:  Past Surgical History  Procedure Laterality Date  . Appendectomy    . Tonsillectomy    . Replacement total knee    . Mastectomy, partial    . Partial colectomy      SOCIAL HISTORY:  Social History  Substance Use Topics  . Smoking status: Never Smoker   . Smokeless tobacco: Not on file  . Alcohol Use: Not on file    FAMILY HISTORY:  Family History  Problem Relation Age of Onset  . Diabetes Mother   . Hypertension Mother   . Deep vein thrombosis Mother     DRUG ALLERGIES: No Known Allergies  REVIEW OF SYSTEMS:   CONSTITUTIONAL: No fever,  positive  fatigue and weakness.  EYES: No blurred or double vision.  EARS, NOSE, AND THROAT: No tinnitus or ear pain.  RESPIRATORY: No cough, shortness of breath, wheezing or hemoptysis.  CARDIOVASCULAR: No chest pain, orthopnea, edema.   GASTROINTESTINAL: No nausea, vomiting, diarrhea ,  positive abdominal pain  GENITOURINARY: No dysuria, hematuria.  ENDOCRINE: No polyuria, nocturia,  HEMATOLOGY: No anemia, easy bruising or bleeding SKIN: No rash or lesion. MUSCULOSKELETAL: No joint pain or arthritis.   NEUROLOGIC: No tingling, numbness, weakness.  PSYCHIATRY: No anxiety or depression.   MEDICATIONS AT HOME:  Prior to Admission medications   Medication Sig Start Date End Date Taking? Authorizing Provider  allopurinol (ZYLOPRIM) 300 MG tablet Take 150 mg by mouth daily.    Yes Historical Provider, MD  ascorbic acid (VITAMIN C) 1000 MG tablet Take 1,000 mg by mouth daily.   Yes Historical Provider, MD  aspirin 81 MG tablet Take 81 mg by mouth at bedtime.    Yes Historical Provider, MD  atenolol (TENORMIN) 25 MG tablet Take 25 mg by mouth daily.    Yes Historical Provider, MD  Calcium Citrate-Vitamin D (CITRACAL + D PO) Take 1 tablet by mouth 2 (two) times daily.   Yes Historical Provider, MD  cholecalciferol (VITAMIN D) 1000 UNITS tablet Take 1,000 Units by mouth daily.   Yes Historical Provider, MD  clindamycin (CLEOCIN) 300 MG capsule Take 600 mg by mouth once as needed (dental appointments). Take 2 hours before dental appointments   Yes Historical Provider, MD  colchicine 0.6 MG tablet Take 0.6 mg by mouth See admin instructions. Take every evening. During gout flares, may take one  tablet every hour for three hours a day.   Yes Historical Provider, MD  folic acid (FOLVITE) 1 MG tablet Take 1 mg by mouth at bedtime.    Yes Historical Provider, MD  hydrochlorothiazide (MICROZIDE) 12.5 MG capsule Take 12.5 mg by mouth 3 (three) times a week. Take on Monday, Wednesday and Friday   Yes Historical Provider, MD  HYDROcodone-acetaminophen (NORCO/VICODIN) 5-325 MG per tablet Take 1-2 tablets by mouth every 6 (six) hours as needed for severe pain.    Yes Historical Provider, MD  latanoprost (XALATAN) 0.005 % ophthalmic solution Place  1 drop into both eyes at bedtime.    Yes Historical Provider, MD  letrozole (FEMARA) 2.5 MG tablet Take 2.5 mg by mouth daily.   Yes Historical Provider, MD  lisinopril (PRINIVIL,ZESTRIL) 30 MG tablet Take 30 mg by mouth 2 (two) times daily.   Yes Historical Provider, MD  Misc Natural Products (OSTEO BI-FLEX ADV DOUBLE ST PO) Take 1 tablet by mouth 2 (two) times daily.   Yes Historical Provider, MD  Multiple Vitamin (MULTIVITAMIN) capsule Take 1 capsule by mouth daily.   Yes Historical Provider, MD  omeprazole (PRILOSEC) 20 MG capsule Take 20 mg by mouth daily. May repeat in the evening if needed   Yes Historical Provider, MD  pioglitazone (ACTOS) 30 MG tablet Take 30 mg by mouth daily.   Yes Historical Provider, MD  risedronate (ACTONEL) 150 MG tablet Take 150 mg by mouth every 30 (thirty) days. with water on empty stomach, nothing by mouth or lie down for next 30 minutes.   Yes Historical Provider, MD  sitaGLIPtin (JANUVIA) 100 MG tablet Take 100 mg by mouth at bedtime.    Yes Historical Provider, MD  warfarin (COUMADIN) 1 MG tablet Take 1 mg by mouth daily at 6 PM. Patient takes in addition to 5mg  tab to make 6mg  dose   Yes Historical Provider, MD  warfarin (COUMADIN) 6 MG tablet Take 6 mg by mouth daily at 6 PM. Patient takes in addition to 1mg  tab to make 6mg  dose   Yes Historical Provider, MD  docusate sodium (COLACE) 100 MG capsule Take 100 mg by mouth at bedtime as needed for mild constipation.     Historical Provider, MD      PHYSICAL EXAMINATION:   VITAL SIGNS: Blood pressure 162/55, pulse 103, temperature 97.4 F (36.3 C), temperature source Oral, resp. rate 20, height 5\' 2"  (1.575 m), weight 126.916 kg (279 lb 12.8 oz), SpO2 100 %.  GENERAL:  81 y.o.-year-old patient lying in the bed with no acute distress.  EYES: Pupils equal, round, reactive to light and accommodation. No scleral icterus. Extraocular muscles intact.  HEENT: Head atraumatic, normocephalic. Oropharynx and  nasopharynx clear.  NECK:  Supple, no jugular venous distention. No thyroid enlargement, no tenderness.  LUNGS: Normal breath sounds bilaterally, no wheezing, rales,rhonchi or crepitation. No use of accessory muscles of respiration.  CARDIOVASCULAR: S1, S2 normal. No murmurs, rubs, or gallops.  ABDOMEN: Soft, nontender, nondistended. Bowel sounds present. No organomegaly or mass.  EXTREMITIES: No pedal edema, cyanosis, or clubbing.  NEUROLOGIC: Cranial nerves II through XII are intact. Muscle strength 5/5 in all extremities. Sensation intact. Gait not checked.  PSYCHIATRIC: The patient is alert and oriented x 3.  SKIN: No obvious rash, lesion, or ulcer.   LABORATORY PANEL:   CBC  Recent Labs Lab 06/03/15 2252 06/04/15 0710  WBC 15.4* 14.3*  HGB 13.0 12.1  HCT 39.4 37.4  PLT 250 249  MCV 91.9 92.1  MCH 30.3 29.9  MCHC 33.0 32.5  RDW 15.3* 14.8*  LYMPHSABS 1.6  --   MONOABS 0.6  --   EOSABS 0.1  --   BASOSABS 0.1  --    ------------------------------------------------------------------------------------------------------------------  Chemistries   Recent Labs Lab 06/03/15 2252 06/04/15 0710  NA 131* 132*  K 4.4 4.1  CL 96* 98*  CO2 25 26  GLUCOSE 261* 204*  BUN 26* 22*  CREATININE 1.27* 1.11*  CALCIUM 9.6 8.7*  AST 29 29  ALT 26 23  ALKPHOS 67 57  BILITOT 0.2* 0.4   ------------------------------------------------------------------------------------------------------------------ estimated creatinine clearance is 48.1 mL/min (by C-G formula based on Cr of 1.11). ------------------------------------------------------------------------------------------------------------------ No results for input(s): TSH, T4TOTAL, T3FREE, THYROIDAB in the last 72 hours.  Invalid input(s): FREET3   Coagulation profile  Recent Labs Lab 06/04/15 0003 06/04/15 0710  INR 2.18 2.06    ------------------------------------------------------------------------------------------------------------------- No results for input(s): DDIMER in the last 72 hours. -------------------------------------------------------------------------------------------------------------------  Cardiac Enzymes No results for input(s): CKMB, TROPONINI, MYOGLOBIN in the last 168 hours.  Invalid input(s): CK ------------------------------------------------------------------------------------------------------------------ Invalid input(s): POCBNP  ---------------------------------------------------------------------------------------------------------------  Urinalysis No results found for: COLORURINE, APPEARANCEUR, LABSPEC, PHURINE, GLUCOSEU, HGBUR, BILIRUBINUR, KETONESUR, PROTEINUR, UROBILINOGEN, NITRITE, LEUKOCYTESUR   RADIOLOGY: No results found.  EKG: Orders placed or performed in visit on 01/26/13  . EKG 12-Lead    IMPRESSION AND PLAN:  patient is a 81 year old white female status post repair of incarcerated hernia   1. Diabetes type 2: Agree with sliding scale insulin for now. Patient was taking adequate by mouth can be resumed on her Januvia, Actos and metformin.  2. Hypertension continue lisinopril and atenolol as taking at home. We can hold hydrochlorothiazide for now. Continue when necessary hydralazine for elevated blood pressure  3. History of recurrent DVT: Resume Coumadin if okay with surgery and also alternatively can use Lovenox full dose.  4. History of breast cancer continue femora  5. Gout: Continue allopurinol and culture seen  6. Glaucoma continue eyedrops  7. History of GERD continue IV Protonix for now changed to omeprazole when able once able to take orally  CODE STATUS:    Code Status Orders        Start     Ordered   06/04/15 0214  Full code   Continuous     06/04/15 0215       TOTAL TIME TAKING CARE OF THIS PAT50 MINUTE   Dustin Flock  M.D on 06/04/2015 at 11:38 AM  Between 7am to 6pm - Pager - [phone]  After 6pm go to [email or web address] - password [email or web address] [facility]  [place] [facility]  Office  [phone]  CC: Primary care physician; Adin Hector, MD

## 2015-06-04 NOTE — Brief Op Note (Signed)
06/03/2015 - 06/04/2015  2:10 AM  PATIENT:  [name]  81 y.o. female  PRE-OPERATIVE DIAGNOSIS:  Incarcerated ventral hernia   POST-OPERATIVE DIAGNOSIS:  incarcerated ventral hernia   PROCEDURE:  Procedure(s):  REPAIR of INCARCERATED VENTRAL HERNIA (N/A)  SURGEON:  Surgeon(s) and Role:    * Marlyce Huge, MD - Primary  PHYSICIAN ASSISTANT:   ASSISTANTS: none   ANESTHESIA:   general  EBL:  Total I/O In: 1500 [I.V.:1500] Out: 55 [Urine:450; Blood:10]  BLOOD ADMINISTERED:none  DRAINS: none   LOCAL MEDICATIONS USED:  NONE  SPECIMEN:  No Specimen  DISPOSITION OF SPECIMEN:  N/A  COUNTS:  YES  TOURNIQUET:  * No tourniquets in log *  DICTATION: .Note written in EPIC  PLAN OF CARE: Admit to inpatient   PATIENT DISPOSITION:  PACU - hemodynamically stable.   Delay start of Pharmacological VTE agent (>24hrs) due to surgical blood loss or risk of bleeding: yes

## 2015-06-04 NOTE — ED Notes (Signed)
Pt to OR via stretcher.

## 2015-06-04 NOTE — Anesthesia Procedure Notes (Signed)
Procedure Name: Intubation Performed by: Vaughan Sine Pre-anesthesia Checklist: Emergency Drugs available, Patient identified, Suction available, Patient being monitored and Timeout performed Patient Re-evaluated:Patient Re-evaluated prior to inductionOxygen Delivery Method: Circle system utilized Preoxygenation: Pre-oxygenation with 100% oxygen Intubation Type: IV induction, Cricoid Pressure applied and Rapid sequence Ventilation: Mask ventilation with difficulty and Oral airway inserted - appropriate to patient size Laryngoscope Size: Miller and 3 Grade View: Grade IV Tube type: Oral Tube size: 7.5 mm Number of attempts: 1 Airway Equipment and Method: Rigid stylet and Oral airway Placement Confirmation: positive ETCO2,  CO2 detector,  ETT inserted through vocal cords under direct vision and breath sounds checked- equal and bilateral Secured at: 21 cm Tube secured with: Tape Difficulty Due To: Difficulty was anticipated, Difficult Airway- due to reduced neck mobility, Difficult Airway- due to large tongue, Difficult Airway- due to anterior larynx and Difficult Airway- due to limited oral opening

## 2015-06-04 NOTE — Progress Notes (Signed)
Patient is alert but confused.  VSS.  Patient complaint of some abdominal pain which was relieved with  Dilaudid once.  Patient just not feeling hungry not taking in many liquids.  Foley draining adequately.  Patient did get up and sit in the chair for about an hour today.  No significant changes.

## 2015-06-04 NOTE — Anesthesia Postprocedure Evaluation (Signed)
Anesthesia Post Note  Patient: [name]  Procedure(s) Performed: Procedure(s) (LRB):  REPAIR of INCARCERATED VENTRAL HERNIA (N/A)  Patient location during evaluation: PACU Anesthesia Type: General Level of consciousness: awake and alert Pain management: pain level controlled Vital Signs Assessment: post-procedure vital signs reviewed and stable Respiratory status: spontaneous breathing Cardiovascular status: stable Anesthetic complications: no    Last Vitals:  Filed Vitals:   06/04/15 0611 06/04/15 1212  BP: 162/55 136/40  Pulse: 103 92  Temp: 36.3 C 37.1 C  Resp: 20 18    Last Pain:  Filed Vitals:   06/04/15 1546  PainSc: Asleep                 Sabriyah Wilcher S

## 2015-06-05 LAB — GLUCOSE, CAPILLARY
GLUCOSE-CAPILLARY: 153 mg/dL — AB (ref 65–99)
GLUCOSE-CAPILLARY: 167 mg/dL — AB (ref 65–99)
GLUCOSE-CAPILLARY: 173 mg/dL — AB (ref 65–99)
GLUCOSE-CAPILLARY: 179 mg/dL — AB (ref 65–99)
GLUCOSE-CAPILLARY: 184 mg/dL — AB (ref 65–99)
Glucose-Capillary: 180 mg/dL — ABNORMAL HIGH (ref 65–99)

## 2015-06-05 LAB — CBC
HCT: 36.6 % (ref 35.0–47.0)
Hemoglobin: 12 g/dL (ref 12.0–16.0)
MCH: 30.6 pg (ref 26.0–34.0)
MCHC: 32.9 g/dL (ref 32.0–36.0)
MCV: 93.1 fL (ref 80.0–100.0)
PLATELETS: 244 10*3/uL (ref 150–440)
RBC: 3.93 MIL/uL (ref 3.80–5.20)
RDW: 15.3 % — ABNORMAL HIGH (ref 11.5–14.5)
WBC: 12.7 10*3/uL — AB (ref 3.6–11.0)

## 2015-06-05 LAB — BASIC METABOLIC PANEL
ANION GAP: 8 (ref 5–15)
BUN: 16 mg/dL (ref 6–20)
CO2: 26 mmol/L (ref 22–32)
Calcium: 8.6 mg/dL — ABNORMAL LOW (ref 8.9–10.3)
Chloride: 99 mmol/L — ABNORMAL LOW (ref 101–111)
Creatinine, Ser: 1.07 mg/dL — ABNORMAL HIGH (ref 0.44–1.00)
GFR, EST AFRICAN AMERICAN: 54 mL/min — AB (ref >60)
GFR, EST NON AFRICAN AMERICAN: 46 mL/min — AB (ref >60)
Glucose, Bld: 181 mg/dL — ABNORMAL HIGH (ref 65–99)
POTASSIUM: 4.4 mmol/L (ref 3.5–5.1)
SODIUM: 133 mmol/L — AB (ref 135–145)

## 2015-06-05 MED ORDER — ONDANSETRON HCL 4 MG/2ML IJ SOLN: 4.0000 mg | INTRAMUSCULAR | Status: DC | PRN

## 2015-06-05 MED ORDER — HYDROMORPHONE HCL 1 MG/ML IJ SOLN: 0.5000 mg | INTRAMUSCULAR | Status: DC | PRN

## 2015-06-05 MED ORDER — ACETAMINOPHEN 325 MG PO TABS
650.0000 mg | ORAL_TABLET | Freq: Four times a day (QID) | ORAL | Status: DC
  Administered 2015-06-05 – 2015-06-06 (×5): 650 mg via ORAL
  Filled 2015-06-05 (×5): qty 2

## 2015-06-05 MED ORDER — ALUM & MAG HYDROXIDE-SIMETH 200-200-20 MG/5ML PO SUSP
30.0000 mL | ORAL | Status: DC | PRN
Start: 2015-06-05 — End: 2015-06-06
  Administered 2015-06-05: 30 mL via ORAL
  Filled 2015-06-05: qty 30

## 2015-06-05 MED ORDER — KETOROLAC TROMETHAMINE 15 MG/ML IJ SOLN
15.0000 mg | Freq: Four times a day (QID) | INTRAMUSCULAR | Status: DC
  Filled 2015-06-05: qty 1

## 2015-06-05 MED ORDER — INFLUENZA VAC SPLIT QUAD 0.5 ML IM SUSY
0.5000 mL | PREFILLED_SYRINGE | INTRAMUSCULAR | Status: AC
  Administered 2015-06-06: 0.5 mL via INTRAMUSCULAR
  Filled 2015-06-05: qty 0.5

## 2015-06-05 MED ORDER — PANTOPRAZOLE SODIUM 40 MG PO TBEC
40.0000 mg | DELAYED_RELEASE_TABLET | Freq: Every day | ORAL | Status: DC
  Administered 2015-06-05 – 2015-06-06 (×2): 40 mg via ORAL
  Filled 2015-06-05 (×2): qty 1

## 2015-06-05 MED ORDER — ONDANSETRON 4 MG PO TBDP: 4.0000 mg | ORAL_TABLET | ORAL | Status: DC | PRN

## 2015-06-05 MED ORDER — ALUM & MAG HYDROXIDE-SIMETH 200-200-20 MG/5ML PO SUSP: 15.0000 mL | ORAL | Status: DC | PRN

## 2015-06-05 MED ORDER — OXYCODONE HCL 5 MG PO TABS: 10.0000 mg | ORAL_TABLET | ORAL | Status: DC | PRN

## 2015-06-05 MED ORDER — IBUPROFEN 400 MG PO TABS: 600.0000 mg | ORAL_TABLET | ORAL | Status: DC | PRN

## 2015-06-05 MED ORDER — CEFTRIAXONE SODIUM 1 G IJ SOLR
1.0000 g | INTRAMUSCULAR | Status: DC
  Filled 2015-06-05: qty 10

## 2015-06-05 MED ORDER — CIPROFLOXACIN HCL 500 MG PO TABS
500.0000 mg | ORAL_TABLET | Freq: Two times a day (BID) | ORAL | Status: DC
  Administered 2015-06-05 – 2015-06-06 (×2): 500 mg via ORAL
  Filled 2015-06-05 (×2): qty 1

## 2015-06-05 MED ORDER — OXYCODONE-ACETAMINOPHEN 5-325 MG PO TABS: 1.0000 | ORAL_TABLET | Freq: Four times a day (QID) | ORAL | Status: DC | PRN

## 2015-06-05 NOTE — Progress Notes (Signed)
[facility] [place] NAME: [name]    MR#:  [id number]  DATE OF BIRTH:  Nov 30, 1928  SUBJECTIVE: Medical consult follow-up for management of hypertension, diabetes. Noted that she was very confused this afternoon. Discontinue all narcotics. Now she is sitting in the chair very alert and oriented, family at bedside. Admitted to surgery for incarcerated hernia, status post repair.   CHIEF COMPLAINT:   Chief Complaint  Patient presents with  . Abdominal Pain  . Emesis    REVIEW OF SYSTEMS:   ROS CONSTITUTIONAL: No fever, fatigue or weakness.  EYES: No blurred or double vision.  EARS, NOSE, AND THROAT: No tinnitus or ear pain.  RESPIRATORY: No cough, shortness of breath, wheezing or hemoptysis.  CARDIOVASCULAR: No chest pain, orthopnea, edema.  GASTROINTESTINAL: Nausea present minimal abdominal pain. GENITOURINARY: No dysuria, hematuria.  ENDOCRINE: No polyuria, nocturia,  HEMATOLOGY: No anemia, easy bruising or bleeding SKIN: No rash or lesion. MUSCULOSKELETAL: No joint pain or arthritis.   NEUROLOGIC: No tingling, numbness, weakness.  PSYCHIATRY: No anxiety or depression.   DRUG ALLERGIES:  No Known Allergies  VITALS:  Blood pressure 149/75, pulse 86, temperature 99.1 F (37.3 C), temperature source Oral, resp. rate 18, height 5\' 2"  (1.575 m), weight 126.916 kg (279 lb 12.8 oz), SpO2 97 %.  PHYSICAL EXAMINATION:  GENERAL:  81 y.o.-year-old patient lying in the bed with no acute distress.  EYES: Pupils equal, round, reactive to light and accommodation. No scleral icterus. Extraocular muscles intact.  HEENT: Head atraumatic, normocephalic. Oropharynx and nasopharynx clear.  NECK:  Supple, no jugular venous distention. No thyroid enlargement, no tenderness.  LUNGS: Normal breath sounds bilaterally, no wheezing, rales,rhonchi or crepitation. No use of accessory muscles of respiration.  CARDIOVASCULAR: S1, S2 normal. No  murmurs, rubs, or gallops.  ABDOMEN: Soft, nontender, nondistended. Bowel sounds present abdominal incision site looks clean . Marland Kitchen No organomegaly or mass.  EXTREMITIES: No pedal edema, cyanosis, or clubbing.  NEUROLOGIC: Cranial nerves II through XII are intact. Muscle strength 5/5 in all extremities. Sensation intact. Gait not checked.  PSYCHIATRIC: The patient is alert and oriented x 3.  SKIN: No obvious rash, lesion, or ulcer.    LABORATORY PANEL:   CBC  Recent Labs Lab 06/05/15 0627  WBC 12.7*  HGB 12.0  HCT 36.6  PLT 244   ------------------------------------------------------------------------------------------------------------------  Chemistries   Recent Labs Lab 06/04/15 0710 06/05/15 0627  NA 132* 133*  K 4.1 4.4  CL 98* 99*  CO2 26 26  GLUCOSE 204* 181*  BUN 22* 16  CREATININE 1.11* 1.07*  CALCIUM 8.7* 8.6*  AST 29  --   ALT 23  --   ALKPHOS 57  --   BILITOT 0.4  --    ------------------------------------------------------------------------------------------------------------------  Cardiac Enzymes No results for input(s): TROPONINI in the last 168 hours. ------------------------------------------------------------------------------------------------------------------  RADIOLOGY:  No results found.  EKG:   Orders placed or performed in visit on 01/26/13  . EKG 12-Lead    ASSESSMENT AND PLAN:  1. Hypertension; controlled: Continue lisinopril, and atenolol. 2 diabetes mellitus type 2''intake is poor so continue sliding scale with coverage only. Hold Actos, Januvia. Restart them at discharge. 3.confusion secondary to narcotics:   confusion resolved after stopping narcotics.. Monitor closely. #4 history of DVT patient the Coumadin can be restarted if okay with surgery. #History of breast cancer patient is on Femara continue that. Chronic gout continue colchicine, allopurinol.  All the records are reviewed and case discussed with  Care  Management/Social Workerr. Management plans discussed with the patient, family and they are in agreement.  CODE STATUS: full  TOTAL TIME TAKING CARE OF THIS PATIENT: 35 minutes.   POSSIBLE D/C IN 1-2 DAYS, DEPENDING ON CLINICAL CONDITION.   Epifanio Lesches M.D on 06/05/2015 at 2:17 PM  Between 7am to 6pm - Pager - [phone]  After 6pm go to [email or web address] - password [email or web address] [place] [facility]  Office  [phone]  CC: Primary care physician; Adin Hector, MD   Note: This dictation was prepared with Dragon dictation along with smaller phrase technology. Any transcriptional errors that result from this process are unintentional.

## 2015-06-05 NOTE — Care Management (Signed)
Spoke with patient and adult son Al Klang at the bedside. Patient gave permission to speak freely with the son concerning care. Son stated that she lives with her husband who is independent and helps with her care. The husband does drive but is 81 years old and if patient were to fall the son stated that he would not be able to lift the patient. Patient uses a walker and cane at home and never ambulates farther than 20 feet. Has shower chair and elevated commode seat. Patietn would like to have a company called [facility] in [place] [phone] fro PT at discharge. However I explained that if the physician wanted a [facility] nurse then it may be better to use a company that can provide more services.

## 2015-06-05 NOTE — Progress Notes (Signed)
PT Cancellation Note  Patient Details Name: [name] MRN: [id number] DOB: 06/14/1929   Cancelled Treatment:    Reason Eval/Treat Not Completed: Patient declined, no reason specified.  Nursing reporting pt walking with them on RW with supervision.   Ramond Dial 06/05/2015, 12:11 PM   Mee Hives, PT MS Acute Rehab Dept. Number: [facility] [id number] and [place] [phone]

## 2015-06-05 NOTE — Progress Notes (Signed)
Patient Alert and confused. Refusing to walk with PT, eat, or allow [place] to attempt to start an IV. Patient insisting that we allow her to go home. Husband and son at bedside. Patient finally walked with PT and is sitting in chair with son beside her. Will continue to monitor.  Almedia Balls, RN

## 2015-06-05 NOTE — Evaluation (Signed)
Physical Therapy Evaluation Patient Details Name: [name] MRN: [id number] DOB: 1928/10/03 Today's Date: 06/05/2015   History of Present Illness  81 yo female with onset of incarcerated umbilical hernia, surgical correction on 06/04/15.    Clinical Impression  Pt was seen for evaluation ofher mobility and will need HHPT but is reasonable to go home from hospital.  Has confused presentation but is certainly mobile, will need 24/7 supervision which is available with her husband.    Follow Up Recommendations Home health PT;Supervision/Assistance - 24 hour    Equipment Recommendations  Rolling walker with 5" wheels    Recommendations for Other Services       Precautions / Restrictions Precautions Precautions: Fall Restrictions Weight Bearing Restrictions: No      Mobility  Bed Mobility               General bed mobility comments: up when PT entered  Transfers Overall transfer level: Needs assistance Equipment used: Rolling walker (2 wheeled) Transfers: Sit to/from [facility] Sit to Stand: Supervision Stand pivot transfers: Supervision       General transfer comment: pt needed no physical help to stand  Ambulation/Gait Ambulation/Gait assistance: Supervision (close guard with chair) Ambulation Distance (Feet): 100 Feet Assistive device: Rolling walker (2 wheeled);1 person hand held assist (to maintain safety) Gait Pattern/deviations: Step-through pattern;Wide base of support;Trunk flexed Gait velocity: normal Gait velocity interpretation: at or above normal speed for age/gender General Gait Details: has a limited ability to walk due to having jumped up before PT could get belt on her and a second gown on her  Stairs Stairs:  (did not agree to them)          Wheelchair Mobility    Modified Rankin (Stroke Patients Only)       Balance Overall balance assessment: Modified Independent                                            Pertinent Vitals/Pain Pain Assessment: Faces Pain Score: 3  Faces Pain Scale: Hurts a little bit Pain Location: L knee Pain Descriptors / Indicators: Aching Pain Intervention(s): Monitored during session;Repositioned    Home Living Family/patient expects to be discharged to:: Private residence Living Arrangements: Spouse/significant other Available Help at Discharge: Family;Available 24 hours/day Type of Home: House Home Access: Stairs to enter Entrance Stairs-Rails: Psychiatric nurse of Steps: 3 Home Layout: One level Home Equipment: Cane - single point      Prior Function Level of Independence: Independent with assistive device(s)         Comments: has been having chronic L knee pain even after her TKA     Hand Dominance   Dominant Hand: Right    Extremity/Trunk Assessment   Upper Extremity Assessment: Overall WFL for tasks assessed           Lower Extremity Assessment: Overall WFL for tasks assessed      Cervical / Trunk Assessment: Normal  Communication   Communication: No difficulties (confused)  Cognition Arousal/Alertness: Awake/alert Behavior During Therapy: Agitated Overall Cognitive Status: Difficult to assess       Memory: Decreased recall of precautions;Decreased short-term memory              General Comments General comments (skin integrity, edema, etc.): Pt was able to be seen for therapy with much disagreement by pt initially but finally family  and nursing convinced her.  She is demonstrating good control of walking on RW and will have HHPT follow up    Exercises        Assessment/Plan    PT Assessment Patient needs continued PT services  PT Diagnosis Altered mental status   PT Problem List Decreased strength;Decreased range of motion;Decreased activity tolerance;Decreased balance;Decreased mobility;Decreased coordination;Decreased cognition;Decreased safety awareness;Decreased knowledge of use  of DME;Decreased knowledge of precautions;Cardiopulmonary status limiting activity;Obesity;Pain;Decreased skin integrity  PT Treatment Interventions DME instruction;Gait training;Stair training;Functional mobility training;Therapeutic activities;Therapeutic exercise;Balance training;Neuromuscular re-education;Patient/family education;Cognitive remediation   PT Goals (Current goals can be found in the Care Plan section) Acute Rehab PT Goals Patient Stated Goal: to go home today PT Goal Formulation: With patient/family Time For Goal Achievement: 06/19/15 Potential to Achieve Goals: Good    Frequency Min 2X/week   Barriers to discharge Inaccessible home environment (stairs to enter house) will need supervision to enter house by family who will do this    Co-evaluation               End of Session Equipment Utilized During Treatment: Gait belt Activity Tolerance: Patient tolerated treatment well Patient left: in chair;with call bell/phone within reach;with family/visitor present;with nursing/sitter in room Nurse Communication: Mobility status         Time: [id number] PT Time Calculation (min) (ACUTE ONLY): 26 min   Charges:   PT Evaluation $Initial PT Evaluation Tier I: 1 Procedure $PT Re-evaluation: 1 Procedure PT Treatments $Gait Training: 8-22 mins   PT G Codes:        Ramond Dial 06/07/15, 1:10 PM   Mee Hives, PT MS Acute Rehab Dept. Number: [facility] [id number] and [place] [phone]

## 2015-06-05 NOTE — Progress Notes (Signed)
Called Dr. Alvino Chapel regarding patient removing iv access.  Requested po pain medication.  Leave without access for now and give percocet q6 prn per doctor order.  [name]  06/05/2015  4:31 AM

## 2015-06-05 NOTE — Progress Notes (Signed)
81 yr old POD#2 from incarcerated ventral hernia repair.  Patient confused at times, wanting to go home but complaining of pain and pulled IV out last night.  She is also complaining of heart burn but is passing flatus.   Filed Vitals:   06/05/15 0013 06/05/15 0410  BP: 150/66 166/53  Pulse: 88 100  Temp: 98.3 F (36.8 C) 98.2 F (36.8 C)  Resp: 20 18   PE:  Gen: NAD Abd: soft, appropriately tender, incision site c/d/i Ext: 2+ pulses no edema  A/P: Will get PT to evaluate, advance diet to carb modified, d/c all narcotics and IV medications, tylenol and ibuprofen for pain

## 2015-06-05 NOTE — Progress Notes (Signed)
Surgery Progress Note  S: Confusion overnight, no IV access.  C/o some nausea/pain O:Blood pressure 166/53, pulse 100, temperature 98.2 F (36.8 C), temperature source Oral, resp. rate 18, height 5\' 2"  (1.575 m), weight 279 lb 12.8 oz (126.916 kg), SpO2 97 %. GEN: NAD/A&Ox3 ABD: soft, approp tender, nondistended, incision c/d/i  A/P 81 yo s/p repair of incarcerated VH - PT - foley out - liquids for now - PICC

## 2015-06-06 DIAGNOSIS — K436 Other and unspecified ventral hernia with obstruction, without gangrene: Secondary | ICD-10-CM | POA: Diagnosis not present

## 2015-06-06 LAB — GLUCOSE, CAPILLARY
GLUCOSE-CAPILLARY: 164 mg/dL — AB (ref 65–99)
Glucose-Capillary: 141 mg/dL — ABNORMAL HIGH (ref 65–99)
Glucose-Capillary: 160 mg/dL — ABNORMAL HIGH (ref 65–99)
Glucose-Capillary: 162 mg/dL — ABNORMAL HIGH (ref 65–99)

## 2015-06-06 MED ORDER — CIPROFLOXACIN HCL 500 MG PO TABS
500.0000 mg | ORAL_TABLET | Freq: Two times a day (BID) | ORAL | Status: DC
Start: 2015-06-06 — End: 2015-12-13

## 2015-06-06 MED ORDER — WARFARIN - PHYSICIAN DOSING INPATIENT: Freq: Every day | Status: DC

## 2015-06-06 MED ORDER — WARFARIN SODIUM 1 MG PO TABS: 2.0000 mg | ORAL_TABLET | Freq: Once | ORAL | Status: DC

## 2015-06-06 NOTE — Care Management Note (Addendum)
Case Management Note  Patient Details  Name: [name] MRN: [id number] Date of Birth: 06-15-1929  Subjective/Objective:        Call to Dr Azalee Course to please order home health PT per recommendation of [facility]. Will also need a face-to-face order by Dr Azalee Course per Ms Lusch is a Medicare patient. Discussed home health recommendation by [facility]-Physical Therapy for Ms Tinney to have home health PT with her son who requested [facility] to provide Ms Hunts home health PT. A fax will be sent to [facility] tomorrow after Dr Azalee Course gives orders for home health PT and completes a face-to-face form in EPIC.             Action/Plan:   Expected Discharge Date:                  Expected Discharge Plan:     In-House Referral:     Discharge planning Services     Post Acute Care Choice:    Choice offered to:     DME Arranged:    DME Agency:     HH Arranged:    [place] Agency:     Status of Service:     Medicare Important Message Given:    Date Medicare IM Given:    Medicare IM give by:    Date Additional Medicare IM Given:    Additional Medicare Important Message give by:     If discussed at [place] of Stay Meetings, dates discussed:    Additional Comments:  Zaylan Kissoon A, RN 06/06/2015, 2:09 PM

## 2015-06-06 NOTE — Discharge Instructions (Signed)
Notify MD for any worsening redness, swelling, bleeding or drainage from the incision site, fever of 100.4 or higher, or pain that is not relieved with medications.  Take all medications as prescribed.  Be sure to take the full regimen of antibiotics.

## 2015-06-06 NOTE — Progress Notes (Signed)
06/06/2015 1:28 PM  BP 138/65 mmHg  Pulse 81  Temp(Src) 98.2 F (36.8 C) (Oral)  Resp 18  Ht 5\' 2"  (1.575 m)  Wt 126.916 kg (279 lb 12.8 oz)  BMI 51.16 kg/m2  SpO2 100%. Patient discharged per MD orders. Midline incision honeycomb dressing intact with old drainage present. Discharge instructions reviewed with husband and son. Both verbalized understanding.  Prescriptions discussed and sent to [facility]. Discharged via wheelchair escorted by nursing staff.  Almedia Balls, RN

## 2015-06-06 NOTE — Progress Notes (Signed)
[facility] [place] NAME: [name]    MR#:  [id number]  DATE OF BIRTH:  Jan 14, 1929  Alert, oriented. Tolerated breakfast well. Minimal abdominal pain due to surgery. No nausea, no vomiting.       CHIEF COMPLAINT:   Chief Complaint  Patient presents with  . Abdominal Pain  . Emesis    REVIEW OF SYSTEMS:   Review of Systems  Gastrointestinal: Negative for nausea.   CONSTITUTIONAL: No fever, fatigue or weakness.  EYES: No blurred or double vision.  EARS, NOSE, AND THROAT: No tinnitus or ear pain.  RESPIRATORY: No cough, shortness of breath, wheezing or hemoptysis.  CARDIOVASCULAR: No chest pain, orthopnea, edema.  GASTROINTESTINALt minimal abdominal pain.  GENITOURINARY: No dysuria, hematuria.  ENDOCRINE: No polyuria, nocturia,  HEMATOLOGY: No anemia, easy bruising or bleeding SKIN: No rash or lesion. MUSCULOSKELETAL: No joint pain or arthritis.   NEUROLOGIC: No tingling, numbness, weakness.  PSYCHIATRY: No anxiety or depression.   DRUG ALLERGIES:  No Known Allergies  VITALS:  Blood pressure 138/65, pulse 81, temperature 98.2 F (36.8 C), temperature source Oral, resp. rate 18, height 5\' 2"  (1.575 m), weight 126.916 kg (279 lb 12.8 oz), SpO2 100 %.  PHYSICAL EXAMINATION:  GENERAL:  81 y.o.-year-old patient lying in the bed with no acute distress.  EYES: Pupils equal, round, reactive to light and accommodation. No scleral icterus. Extraocular muscles intact.  HEENT: Head atraumatic, normocephalic. Oropharynx and nasopharynx clear.  NECK:  Supple, no jugular venous distention. No thyroid enlargement, no tenderness.  LUNGS: Normal breath sounds bilaterally, no wheezing, rales,rhonchi or crepitation. No use of accessory muscles of respiration.  CARDIOVASCULAR: S1, S2 normal. No murmurs, rubs, or gallops.  ABDOMEN: Soft, nontender, nondistended. Bowel sounds present abdominal incision site looks clean . Marland Kitchen No  organomegaly or mass.  EXTREMITIES: No pedal edema, cyanosis, or clubbing.  NEUROLOGIC: Cranial nerves II through XII are intact. Muscle strength 5/5 in all extremities. Sensation intact. Gait not checked.  PSYCHIATRIC: The patient is alert and oriented x 3.  SKIN: No obvious rash, lesion, or ulcer.    LABORATORY PANEL:   CBC  Recent Labs Lab 06/05/15 0627  WBC 12.7*  HGB 12.0  HCT 36.6  PLT 244   ------------------------------------------------------------------------------------------------------------------  Chemistries   Recent Labs Lab 06/04/15 0710 06/05/15 0627  NA 132* 133*  K 4.1 4.4  CL 98* 99*  CO2 26 26  GLUCOSE 204* 181*  BUN 22* 16  CREATININE 1.11* 1.07*  CALCIUM 8.7* 8.6*  AST 29  --   ALT 23  --   ALKPHOS 57  --   BILITOT 0.4  --    ------------------------------------------------------------------------------------------------------------------  Cardiac Enzymes No results for input(s): TROPONINI in the last 168 hours. ------------------------------------------------------------------------------------------------------------------  RADIOLOGY:  No results found.  EKG:   Orders placed or performed in visit on 01/26/13  . EKG 12-Lead    ASSESSMENT AND PLAN:  1. Hypertension; controlled: Continue lisinopril, and atenolol. 2 diabetes mellitus type 2'';continue sliding scale with coverage only. Hold Actos, Januvia. Restart them at discharge. 3.confusion secondary to narcotics: Resolved  #4 history of DVT patient the Coumadin can be restarted if okay with surgery. #History of breast cancer patient is on Femara continue that. Chronic gout continue colchicine, allopurinol. #6 UTI ; no IV access so started on Cipro continue Cipro by mouth for 5 days at discharge.  All the records are reviewed and case discussed with Care Management/Social Workerr. Management plans discussed with the  patient, family and they are in agreement.  CODE STATUS:  full  TOTAL TIME TAKING CARE OF THIS PATIENT: 35 minutes.   POSSIBLE D/C IN 1-2 DAYS, DEPENDING ON CLINICAL CONDITION.   Epifanio Lesches M.D on 06/06/2015 at 11:14 AM  Between 7am to 6pm - Pager - [phone]  After 6pm go to [email or web address] - password [email or web address] [email or web address]  Office  [phone]  CC: Primary care physician; Adin Hector, MD   Note: This dictation was prepared with Dragon dictation along with smaller phrase technology. Any transcriptional errors that result from this process are unintentional.

## 2015-06-07 LAB — URINE CULTURE: CULTURE: NO GROWTH

## 2015-06-07 NOTE — Discharge Summary (Signed)
Physician Discharge Summary  Patient ID: [name] MRN: [id number] DOB/AGE: [birth year] 81 y.o.  Admit date: 06/03/2015 Discharge date: 06/07/2015  Admission Diagnoses: Incarcerated ventral hernia  Discharge Diagnoses:  Active Problems:   Umbilical hernia, incarcerated   Incarcerated ventral hernia   Discharged Condition: good  Hospital Course: 81 yr old with incarcerated ventral hernia, she went to the OR for open repair.  She did well postoperative and slowly was able to tolerate a regular diet.  She did have some confusion with narcotics but now pain well controlled with tylenol and ibuprofen.  Patient evaluated by PT and moving with walker.  PT recommendation for home health PT.   Consults: Medicine  Significant Diagnostic Studies: labs: WBC  Treatments: surgery: Open ventral hernia repair  Discharge Exam: Blood pressure 138/65, pulse 81, temperature 98.2 F (36.8 C), temperature source Oral, resp. rate 18, height 5\' 2"  (1.575 m), weight 279 lb 12.8 oz (126.916 kg), SpO2 100 %. General appearance: alert, cooperative and no distress GI: soft, appropriately tender, non-distended, incision site c/d/i Extremities: extremities normal, atraumatic, no cyanosis or edema  Disposition: 01-Home or Self Care  Discharge Instructions    Call MD for:  persistant nausea and vomiting    Complete by:  As directed      Call MD for:  redness, tenderness, or signs of infection (pain, swelling, redness, odor or green/yellow discharge around incision site)    Complete by:  As directed      Call MD for:  severe uncontrolled pain    Complete by:  As directed      Call MD for:  temperature >100.4    Complete by:  As directed      Diet - low sodium heart healthy    Complete by:  As directed      Discharge instructions    Complete by:  As directed   Use tylenol and ibuprofen for pain     Driving Restrictions    Complete by:  As directed   No driving for 4 weeks     Increase activity  slowly    Complete by:  As directed      May shower / Bathe    Complete by:  As directed      May walk up steps    Complete by:  As directed      Remove dressing in 48 hours    Complete by:  As directed             Medication List    TAKE these medications        allopurinol 300 MG tablet  Commonly known as:  ZYLOPRIM  Take 150 mg by mouth daily.     ascorbic acid 1000 MG tablet  Commonly known as:  VITAMIN C  Take 1,000 mg by mouth daily.     aspirin 81 MG tablet  Take 81 mg by mouth at bedtime.     atenolol 25 MG tablet  Commonly known as:  TENORMIN  Take 25 mg by mouth daily.     cholecalciferol 1000 UNITS tablet  Commonly known as:  VITAMIN D  Take 1,000 Units by mouth daily.     ciprofloxacin 500 MG tablet  Commonly known as:  CIPRO  Take 1 tablet (500 mg total) by mouth 2 (two) times daily.     CITRACAL + D PO  Take 1 tablet by mouth 2 (two) times daily.     clindamycin 300 MG capsule  Commonly  known as:  CLEOCIN  Take 600 mg by mouth once as needed (dental appointments). Take 2 hours before dental appointments     colchicine 0.6 MG tablet  Take 0.6 mg by mouth See admin instructions. Take every evening. During gout flares, may take one tablet every hour for three hours a day.     docusate sodium 100 MG capsule  Commonly known as:  COLACE  Take 100 mg by mouth at bedtime as needed for mild constipation.     folic acid 1 MG tablet  Commonly known as:  FOLVITE  Take 1 mg by mouth at bedtime.     hydrochlorothiazide 12.5 MG capsule  Commonly known as:  MICROZIDE  Take 12.5 mg by mouth 3 (three) times a week. Take on Monday, Wednesday and Friday     HYDROcodone-acetaminophen 5-325 MG tablet  Commonly known as:  NORCO/VICODIN  Take 1-2 tablets by mouth every 6 (six) hours as needed for severe pain.     latanoprost 0.005 % ophthalmic solution  Commonly known as:  XALATAN  Place 1 drop into both eyes at bedtime.     letrozole 2.5 MG tablet   Commonly known as:  FEMARA  Take 2.5 mg by mouth daily.     lisinopril 30 MG tablet  Commonly known as:  PRINIVIL,ZESTRIL  Take 30 mg by mouth 2 (two) times daily.     multivitamin capsule  Take 1 capsule by mouth daily.     omeprazole 20 MG capsule  Commonly known as:  PRILOSEC  Take 20 mg by mouth daily. May repeat in the evening if needed     OSTEO BI-FLEX ADV DOUBLE ST PO  Take 1 tablet by mouth 2 (two) times daily.     pioglitazone 30 MG tablet  Commonly known as:  ACTOS  Take 30 mg by mouth daily.     risedronate 150 MG tablet  Commonly known as:  ACTONEL  Take 150 mg by mouth every 30 (thirty) days. with water on empty stomach, nothing by mouth or lie down for next 30 minutes.     sitaGLIPtin 100 MG tablet  Commonly known as:  JANUVIA  Take 100 mg by mouth at bedtime.     warfarin 6 MG tablet  Commonly known as:  COUMADIN  Take 6 mg by mouth daily at 6 PM. Patient takes in addition to 1mg  tab to make 6mg  dose     warfarin 1 MG tablet  Commonly known as:  COUMADIN  Take 1 mg by mouth daily at 6 PM. Patient takes in addition to 5mg  tab to make 6mg  dose           Follow-up Information    Follow up with [facility]. Schedule an appointment as soon as possible for a visit in 2 weeks.   Contact information:   [address] [place] [id number]      Signed: Hubbard Robinson 06/07/2015, 3:43 PM

## 2015-06-10 ENCOUNTER — Inpatient Hospital Stay: Payer: Medicare Other | Admitting: Oncology

## 2015-06-10 ENCOUNTER — Telehealth: Payer: Self-pay

## 2015-06-10 NOTE — Telephone Encounter (Signed)
Post discharge call to patient made at this time. Spoke with patient's husband. He states that pain is controlled with minimal narcotic pain medication. Denies any drainage from incision site. Denies fever. No questions or concerns.   Post-op appointment scheduled for patient. Encouraged patient to call with any questions that arise prior to appointment.

## 2015-06-18 ENCOUNTER — Encounter: Payer: Self-pay | Admitting: Internal Medicine

## 2015-06-18 DIAGNOSIS — M1990 Unspecified osteoarthritis, unspecified site: Secondary | ICD-10-CM | POA: Insufficient documentation

## 2015-06-18 DIAGNOSIS — K219 Gastro-esophageal reflux disease without esophagitis: Secondary | ICD-10-CM | POA: Insufficient documentation

## 2015-06-18 DIAGNOSIS — M810 Age-related osteoporosis without current pathological fracture: Secondary | ICD-10-CM | POA: Insufficient documentation

## 2015-06-18 DIAGNOSIS — E119 Type 2 diabetes mellitus without complications: Secondary | ICD-10-CM | POA: Insufficient documentation

## 2015-06-18 DIAGNOSIS — I82409 Acute embolism and thrombosis of unspecified deep veins of unspecified lower extremity: Secondary | ICD-10-CM | POA: Insufficient documentation

## 2015-06-18 DIAGNOSIS — M109 Gout, unspecified: Secondary | ICD-10-CM | POA: Insufficient documentation

## 2015-06-19 ENCOUNTER — Telehealth: Payer: Self-pay | Admitting: General Surgery

## 2015-06-19 ENCOUNTER — Telehealth: Payer: Self-pay | Admitting: Surgery

## 2015-06-19 NOTE — Telephone Encounter (Signed)
Returned phone call to patient at this time. Informed her, after speaking with Dr. Adonis Huguenin, that we can see her in office as scheduled tomorrow and address urine problem at that time or if the pain is too severe that she may go to Urgent Care or Emergency Room over night.  Explained that patient make pick up AZO over the counter to help with symptoms until tomorrow but that an antibiotic cannot be called in unless we know the source of the problem first. Patient is not happy with this and states, "So, otherwise you are not going to help me at all. I am just miserable."

## 2015-06-19 NOTE — Telephone Encounter (Signed)
Please call patients husband, [name]. Patient had hernia surgery on 11/22 with [facility]. She has been doing okay, today she is in distress, a lot of pain - she is going to the bathroom every half hour - sensation of a full bladder. Please call patients husband and advise.

## 2015-06-19 NOTE — Telephone Encounter (Signed)
After surgery patient went home with medication for bladder trouble. Patients husband called this morning and she is still having trouble. They have an appointment tomorrow with Dr. Adonis Huguenin. Patient would like someone to call today.

## 2015-06-19 NOTE — Telephone Encounter (Signed)
Patient's husband explains that patient finished Cipro for Urinary Tract Infection post-surgery on 11/24-11/28. Patient began having dysuria, urgency, and frequency once again yesterday. Patient is to see Dr. Adonis Huguenin tomorrow.  Will speak with Dr. Adonis Huguenin in regards to ordering a UA prior to tomorrow's appointment.

## 2015-06-19 NOTE — Telephone Encounter (Signed)
See other encounter for additional information.

## 2015-06-20 ENCOUNTER — Ambulatory Visit (INDEPENDENT_AMBULATORY_CARE_PROVIDER_SITE_OTHER): Payer: Medicare Other | Admitting: General Surgery

## 2015-06-20 ENCOUNTER — Encounter: Payer: Self-pay | Admitting: General Surgery

## 2015-06-20 ENCOUNTER — Encounter: Payer: Self-pay | Admitting: Internal Medicine

## 2015-06-20 VITALS — BP 157/85 | HR 87 | Temp 97.5°F | Wt 278.0 lb

## 2015-06-20 DIAGNOSIS — Z4889 Encounter for other specified surgical aftercare: Secondary | ICD-10-CM

## 2015-06-20 DIAGNOSIS — R300 Dysuria: Secondary | ICD-10-CM

## 2015-06-20 NOTE — Patient Instructions (Signed)
You will not need to follow-up in the office after today unless you have a problem or concern.  If you have any questions or concerns, please call our office and speak with a nurse.  We are sending you to the medical mall at [facility] at this time to have a Urinalysis done, we will call you for the results and send in antibiotics at that time if they are needed.

## 2015-06-20 NOTE — Progress Notes (Signed)
Outpatient Surgical Follow Up  06/20/2015  [name] is an 81 y.o. female.   Chief Complaint  Patient presents with  . Routine Post Op    Ventral Hernia Repair Dr. Rexene Edison 06/04/2015    HPI: A 81 year old female returns to clinic 2 weeks status post open incisional hernia repair. Patient reports no pain. She is eating well and having normal bowel function. She does report early satiety and occasional nausea. Patient also states that she is having an incomplete sensation of urinary emptying and she thinks that she may need another course of antibiotics for a UTI. She denies any fevers, chills, nausea, vomiting, diarrhea or constipation and is otherwise doing well and happy with her surgical care.  Past Medical History  Diagnosis Date  . Arthritis   . Osteoporosis   . Glaucoma   . Hypertension   . Gastric ulcer   . Hiatal hernia   . Diabetes mellitus without complication ([place])   . Breast cancer ([place])   . Recurrent deep vein thrombosis (DVT) [facility])     Past Surgical History  Procedure Laterality Date  . Appendectomy    . Tonsillectomy    . Replacement total knee    . Mastectomy, partial    . Partial colectomy    . Ventral hernia repair N/A 06/04/2015    Procedure:  REPAIR of INCARCERATED VENTRAL HERNIA;  Surgeon: Marlyce Huge, MD;  Location: [facility] ORS;  Service: General;  Laterality: N/A;    Family History  Problem Relation Age of Onset  . Diabetes Mother   . Hypertension Mother   . Deep vein thrombosis Mother     Social History:  reports that she has never smoked. She does not have any smokeless tobacco history on file. She reports that she does not drink alcohol or use illicit drugs.  Allergies:  Allergies  Allergen Reactions  . Metformin Diarrhea  . Oxycodone-Acetaminophen Nausea Only  . Amoxicillin Rash    Medications reviewed.    ROS  A multipoint ROS was completed and was negative except for what is documented in the HPI.  BP 157/85 mmHg   Pulse 87  Temp(Src) 97.5 F (36.4 C) (Oral)  Wt 126.1 kg (278 lb)  Physical Exam  Gen: NAD Resp: CTA CV: RRR Abd: Soft, NT, ND. Well approximated midline incision from. Staples in place without any signs of infection or drainage. Induration remains at site of prior hernia    No results found for this or any previous visit (from the past 42 hour(s)). No results found.  Assessment/Plan:  1. Dysuria Will obtain UA and treat if indicated. If no evidence of UTI will then refer to urology - Urinalysis with microscopic - Urine culture  2. Aftercare following surgery Staples removed and replaced with steristrips. Discussed healing timeframe and expected recovery. She is doing well and will follow up as needed.     Clayburn Pert, MD FACS General Surgeon  06/20/2015,4:11 PM

## 2015-06-21 ENCOUNTER — Telehealth: Payer: Self-pay

## 2015-06-21 ENCOUNTER — Other Ambulatory Visit
Admission: RE | Admit: 2015-06-21 | Discharge: 2015-06-21 | Disposition: A | Discharge status: Home / Self Care | Payer: Medicare Other | Source: Ambulatory Visit | Attending: General Surgery | Admitting: General Surgery

## 2015-06-21 DIAGNOSIS — R300 Dysuria: Secondary | ICD-10-CM | POA: Diagnosis present

## 2015-06-21 LAB — URINALYSIS COMPLETE WITH MICROSCOPIC (ARMC ONLY)
BILIRUBIN URINE: NEGATIVE
Bacteria, UA: NONE SEEN
Glucose, UA: NEGATIVE mg/dL
Hgb urine dipstick: NEGATIVE
KETONES UR: NEGATIVE mg/dL
LEUKOCYTES UA: NEGATIVE
NITRITE: NEGATIVE
PH: 5 (ref 5.0–8.0)
PROTEIN: NEGATIVE mg/dL
RBC / HPF: NONE SEEN RBC/hpf (ref 0–5)
SPECIFIC GRAVITY, URINE: 1.012 (ref 1.005–1.030)

## 2015-06-21 NOTE — Telephone Encounter (Signed)
Results of negative urinalysis given to patient's husband ALbert at this time. Explained that it would be best to contact patient's PCP on Monday for advice as to why she is having dysuria, urgency, and frequency.  He verbalized understanding of this conversation and will call Monday morning.

## 2015-06-23 LAB — URINE CULTURE

## 2015-06-24 ENCOUNTER — Inpatient Hospital Stay: Payer: Medicare Other | Admitting: Oncology

## 2015-07-10 ENCOUNTER — Inpatient Hospital Stay: Payer: Medicare Other | Attending: Oncology | Admitting: Oncology

## 2015-07-10 VITALS — BP 164/93 | HR 79 | Temp 98.7°F | Resp 18 | Wt 269.4 lb

## 2015-07-10 DIAGNOSIS — I10 Essential (primary) hypertension: Secondary | ICD-10-CM | POA: Diagnosis not present

## 2015-07-10 DIAGNOSIS — E119 Type 2 diabetes mellitus without complications: Secondary | ICD-10-CM | POA: Diagnosis not present

## 2015-07-10 DIAGNOSIS — Z7901 Long term (current) use of anticoagulants: Secondary | ICD-10-CM | POA: Diagnosis not present

## 2015-07-10 DIAGNOSIS — Z7982 Long term (current) use of aspirin: Secondary | ICD-10-CM | POA: Diagnosis not present

## 2015-07-10 DIAGNOSIS — Z923 Personal history of irradiation: Secondary | ICD-10-CM | POA: Insufficient documentation

## 2015-07-10 DIAGNOSIS — Z79811 Long term (current) use of aromatase inhibitors: Secondary | ICD-10-CM | POA: Diagnosis not present

## 2015-07-10 DIAGNOSIS — M1990 Unspecified osteoarthritis, unspecified site: Secondary | ICD-10-CM

## 2015-07-10 DIAGNOSIS — M2550 Pain in unspecified joint: Secondary | ICD-10-CM | POA: Diagnosis not present

## 2015-07-10 DIAGNOSIS — Z79899 Other long term (current) drug therapy: Secondary | ICD-10-CM | POA: Diagnosis not present

## 2015-07-10 DIAGNOSIS — Z86718 Personal history of other venous thrombosis and embolism: Secondary | ICD-10-CM | POA: Insufficient documentation

## 2015-07-10 DIAGNOSIS — Z780 Asymptomatic menopausal state: Secondary | ICD-10-CM

## 2015-07-10 DIAGNOSIS — M810 Age-related osteoporosis without current pathological fracture: Secondary | ICD-10-CM | POA: Diagnosis not present

## 2015-07-10 DIAGNOSIS — Z170 Estrogen receptor positive status [ER+]: Secondary | ICD-10-CM

## 2015-07-10 DIAGNOSIS — C50911 Malignant neoplasm of unspecified site of right female breast: Secondary | ICD-10-CM | POA: Diagnosis not present

## 2015-07-10 NOTE — Progress Notes (Signed)
Patient had emergency surgery for hernia repair in 05/2015.   She also is having a lot of nasal drainage, body pains especially in right shoulder that they think is related to the Letrozole.

## 2015-07-10 NOTE — Progress Notes (Signed)
[place]  [phone]  [phone]     [name] DOB: February 07, 1929  MR#: [id number]  AOZ#:[id number]  Patient Care Team: Adin Hector, MD as PCP - General (Internal Medicine)  CHIEF COMPLAINT:  Chief Complaint  Patient presents with  . Breast Cancer    INTERVAL HISTORY: Patient returns to clinic today for routine 6 month evaluation.  She feels well other than some joint pain which she states is tolerable. She has no neurologic complaints. She has a good appetite and denies weight loss. She has no chest pain or shortness of breath. She denies any nausea, vomiting, constipation, or diarrhea. She has no urinary complaints. Patient offers no further specific complaints today.  REVIEW OF SYSTEMS:   Review of Systems  Constitutional: Negative.   HENT: Negative.   Eyes: Negative.   Respiratory: Negative.   Cardiovascular: Negative.   Gastrointestinal: Negative.   Genitourinary: Negative.   Musculoskeletal: Positive for joint pain.  Skin: Negative.   Neurological: Negative.   Endo/Heme/Allergies: Negative.   Psychiatric/Behavioral: Negative.     As per HPI. Otherwise, a complete review of systems is negatve.  ONCOLOGY HISTORY: Oncology History   Adenocarcinoma of right breast [facility])   Staging form: Breast, AJCC 7th Edition     Pathologic stage from 02/09/2013: Stage IB (T1c, N64m, cM0) - Signed by TLemar Livingson 07/10/2015     Clinical stage from 12/14/2014: Stage IIA (T2, N0, M0) - Signed by TLloyd Huger MD on 12/14/2014       Adenocarcinoma of right breast ([facility]   02/09/2013 Pathology Results 1B   06/09/2013 -  Anti-estrogen oral therapy Letrozole    12/14/2014 Initial Diagnosis Adenocarcinoma of right breast ([facility]    PAST MEDICAL HISTORY: Past Medical History  Diagnosis Date  . Arthritis   . Osteoporosis   . Glaucoma   . Hypertension   . Gastric ulcer   . Hiatal hernia   . [place] complication  [place]   . [place]   . Recurrent deep vein thrombosis (DVT) ([facility]     PAST SURGICAL HISTORY: Past Surgical History  Procedure Laterality Date  . Appendectomy    . Tonsillectomy    . Replacement total knee    . Mastectomy, partial    . Partial colectomy    . Ventral hernia repair N/A 06/04/2015    Procedure:  REPAIR of INCARCERATED VENTRAL HERNIA;  Surgeon: CMarlyce Huge MD;  Location: [facility] ORS;  Service: General;  Laterality: N/A;    FAMILY HISTORY Family History  Problem Relation Age of Onset  . Diabetes Mother   . Hypertension Mother   . Deep vein thrombosis Mother     GYNECOLOGIC HISTORY:  No LMP recorded. Patient is postmenopausal.     ADVANCED DIRECTIVES:    HEALTH MAINTENANCE: Social History  Substance Use Topics  . Smoking status: Never Smoker   . Smokeless tobacco: Not on file  . Alcohol Use: No    Allergies  Allergen Reactions  . Metformin Diarrhea  . Oxycodone-Acetaminophen Nausea Only  . Amoxicillin Rash    Current Outpatient Prescriptions  Medication Sig Dispense Refill  . allopurinol (ZYLOPRIM) 300 MG tablet Take 150 mg by mouth daily.     .Marland Kitchenascorbic acid (VITAMIN C) 1000 MG tablet Take 1,000 mg by mouth daily.    .Marland Kitchenaspirin 81 MG tablet Take 81 mg by mouth at bedtime.     .Marland Kitchenatenolol (TENORMIN) 25 MG tablet Take  25 mg by mouth daily.     . Calcium Citrate-Vitamin D (CITRACAL + D PO) Take 1 tablet by mouth 2 (two) times daily.    . cholecalciferol (VITAMIN D) 1000 UNITS tablet Take 1,000 Units by mouth daily.    . clindamycin (CLEOCIN) 300 MG capsule Take 600 mg by mouth once as needed (dental appointments). Take 2 hours before dental appointments    . colchicine 0.6 MG tablet Take 0.6 mg by mouth See admin instructions. Take every evening. During gout flares, may take one tablet every hour for three hours a day.    . docusate sodium (COLACE) 100 MG capsule Take 100 mg by mouth at bedtime as needed for mild constipation.     .  folic acid (FOLVITE) 1 MG tablet Take 1 mg by mouth at bedtime.     Marland Kitchen glipiZIDE (GLUCOTROL XL) 2.5 MG 24 hr tablet Take 1 tablet by mouth 1 day or 1 dose.    . hydrochlorothiazide (MICROZIDE) 12.5 MG capsule Take 12.5 mg by mouth 3 (three) times a week. Take on Monday, Wednesday and Friday    . latanoprost (XALATAN) 0.005 % ophthalmic solution Place 1 drop into both eyes at bedtime.     Marland Kitchen letrozole (FEMARA) 2.5 MG tablet Take 2.5 mg by mouth daily.    Marland Kitchen lisinopril (PRINIVIL,ZESTRIL) 30 MG tablet Take 30 mg by mouth 2 (two) times daily.    . Misc Natural Products (OSTEO BI-FLEX ADV DOUBLE ST PO) Take 1 tablet by mouth 2 (two) times daily.    . Multiple Vitamin (MULTIVITAMIN) capsule Take 1 capsule by mouth daily.    Marland Kitchen omeprazole (PRILOSEC) 20 MG capsule Take 20 mg by mouth daily. May repeat in the evening if needed    . pioglitazone (ACTOS) 30 MG tablet Take 30 mg by mouth daily.    . risedronate (ACTONEL) 150 MG tablet Take 150 mg by mouth every 30 (thirty) days. with water on empty stomach, nothing by mouth or lie down for next 30 minutes.    . sitaGLIPtin (JANUVIA) 100 MG tablet Take 100 mg by mouth at bedtime.     Marland Kitchen warfarin (COUMADIN) 6 MG tablet Take 6 mg by mouth daily at 6 PM. Patient takes in addition to 74m tab to make 637mdose    . ciprofloxacin (CIPRO) 500 MG tablet Take 1 tablet (500 mg total) by mouth 2 (two) times daily. (Patient not taking: Reported on 07/10/2015) 10 tablet 0   No current facility-administered medications for this visit.    OBJECTIVE: BP 164/93 mmHg  Pulse 79  Temp(Src) 98.7 F (37.1 C) (Tympanic)  Resp 18  Wt 269 lb 6.4 oz (122.2 kg)   Body mass index is 49.26 kg/(m^2).    ECOG FS:0 - Asymptomatic  General: Well-developed, well-nourished, no acute distress. Eyes: Pink conjunctiva, anicteric sclera. Breasts: Patient refused breast exam today. Lungs: Clear to auscultation bilaterally. Heart: Regular rate and rhythm. No rubs, murmurs, or gallops. Abdomen:  Soft, nontender, nondistended. No organomegaly noted, normoactive bowel sounds. Musculoskeletal: No edema, cyanosis, or clubbing. Neuro: Alert, answering all questions appropriately. Cranial nerves grossly intact. Skin: No rashes or petechiae noted. Psych: Normal affect.   LAB RESULTS:  No visits with results within 3 Day(s) from this visit. Latest known visit with results is:  Hospital Outpatient Visit on 06/21/2015  Component Date Value Ref Range Status  . Color, Urine 06/21/2015 YELLOW* YELLOW Final  . APPearance 06/21/2015 CLEAR* CLEAR Final  . Glucose, UA 06/21/2015 NEGATIVE  NEGATIVE mg/dL Final  . Bilirubin Urine 06/21/2015 NEGATIVE  NEGATIVE Final  . Ketones, ur 06/21/2015 NEGATIVE  NEGATIVE mg/dL Final  . Specific Gravity, Urine 06/21/2015 1.012  1.005 - 1.030 Final  . Hgb urine dipstick 06/21/2015 NEGATIVE  NEGATIVE Final  . pH 06/21/2015 5.0  5.0 - 8.0 Final  . Protein, ur 06/21/2015 NEGATIVE  NEGATIVE mg/dL Final  . Nitrite 06/21/2015 NEGATIVE  NEGATIVE Final  . Leukocytes, UA 06/21/2015 NEGATIVE  NEGATIVE Final  . RBC / HPF 06/21/2015 NONE SEEN  0 - 5 RBC/hpf Final  . WBC, UA 06/21/2015 0-5  0 - 5 WBC/hpf Final  . Bacteria, UA 06/21/2015 NONE SEEN  NONE SEEN Final  . Squamous Epithelial / LPF 06/21/2015 0-5* NONE SEEN Final  . Specimen Description 06/21/2015 URINE, CLEAN CATCH   Final  . Special Requests 06/21/2015 NONE   Final  . Culture 06/21/2015 MULTIPLE SPECIES PRESENT, SUGGEST RECOLLECTION   Final  . Report Status 06/21/2015 06/23/2015 FINAL   Final    STUDIES: No results found.  ASSESSMENT: Pathologic stage Ib adenocarcinoma of the right breast. ER/PR positive, HER-2 negative.  PLAN:   1. Breast cancer: Given patient's stage of disease she did not require adjuvant chemotherapy. Oncotype DX has been considered, but given her advanced age chemotherapy may be more detrimental. Continue letrozole which she will complete in November 2019. Mammogram and  Bone Density ordered today. Return to clinic in 6 months for further evaluation.   Patient expressed understanding and was in agreement with this plan. She also understands that She can call clinic at any time with any questions, concerns, or complaints.   Dr. Grayland Ormond was available for consultation and review of plan of care for this patient.   Graylon Gunning Audris Speaker   07/10/2015 3:51 PM   Patient seen and evaluated independently and I agree with the assessment and plan as dictated above.

## 2015-07-29 ENCOUNTER — Other Ambulatory Visit: Payer: Medicare Other

## 2015-07-29 ENCOUNTER — Ambulatory Visit: Payer: Medicare Other

## 2015-08-12 ENCOUNTER — Other Ambulatory Visit: Payer: Self-pay | Admitting: Oncology

## 2015-08-12 ENCOUNTER — Ambulatory Visit
Admission: RE | Admit: 2015-08-12 | Discharge: 2015-08-12 | Disposition: A | Discharge status: Home / Self Care | Payer: Medicare Other | Source: Ambulatory Visit | Attending: Oncology | Admitting: Oncology

## 2015-08-12 DIAGNOSIS — M8580 Other specified disorders of bone density and structure, unspecified site: Secondary | ICD-10-CM | POA: Diagnosis not present

## 2015-08-12 DIAGNOSIS — C50911 Malignant neoplasm of unspecified site of right female breast: Secondary | ICD-10-CM

## 2015-08-12 DIAGNOSIS — Z780 Asymptomatic menopausal state: Secondary | ICD-10-CM

## 2015-08-12 DIAGNOSIS — Z853 Personal history of malignant neoplasm of breast: Secondary | ICD-10-CM | POA: Diagnosis not present

## 2015-08-12 DIAGNOSIS — Z13820 Encounter for screening for osteoporosis: Secondary | ICD-10-CM | POA: Diagnosis not present

## 2015-09-17 ENCOUNTER — Telehealth: Payer: Self-pay

## 2015-09-17 ENCOUNTER — Other Ambulatory Visit: Payer: Self-pay | Admitting: *Deleted

## 2015-09-17 DIAGNOSIS — C50911 Malignant neoplasm of unspecified site of right female breast: Secondary | ICD-10-CM

## 2015-09-17 MED ORDER — ANASTROZOLE 1 MG PO TABS: 1.0000 mg | ORAL_TABLET | Freq: Every day | ORAL | Status: DC

## 2015-09-17 NOTE — Telephone Encounter (Signed)
Ok. She can have arimadex 1mg  daily.  Thank you.

## 2015-09-17 NOTE — Telephone Encounter (Signed)
Requesting to change the Femara due to severe knee joint pain.  Also concerned with short term memory worsening.

## 2015-09-17 NOTE — Telephone Encounter (Signed)
RX sent to pharmacy  

## 2015-09-18 ENCOUNTER — Inpatient Hospital Stay
Admission: RE | Admit: 2015-09-18 | Payer: TRICARE For Life (TFL) | Source: Ambulatory Visit | Admitting: Radiation Oncology

## 2015-09-18 ENCOUNTER — Ambulatory Visit: Payer: TRICARE For Life (TFL) | Admitting: Radiation Oncology

## 2015-09-18 ENCOUNTER — Other Ambulatory Visit: Payer: Self-pay

## 2015-09-18 DIAGNOSIS — C50911 Malignant neoplasm of unspecified site of right female breast: Secondary | ICD-10-CM

## 2015-09-18 MED ORDER — ANASTROZOLE 1 MG PO TABS: 1.0000 mg | ORAL_TABLET | Freq: Every day | ORAL | Status: DC

## 2015-09-18 NOTE — Telephone Encounter (Signed)
Husband notified and they request the rx be sent to [facility] in [place], rx sent.

## 2015-09-19 ENCOUNTER — Other Ambulatory Visit: Payer: Self-pay

## 2015-09-19 DIAGNOSIS — C50911 Malignant neoplasm of unspecified site of right female breast: Secondary | ICD-10-CM

## 2015-09-19 MED ORDER — ANASTROZOLE 1 MG PO TABS: 1.0000 mg | ORAL_TABLET | Freq: Every day | ORAL | Status: DC

## 2015-11-07 ENCOUNTER — Other Ambulatory Visit: Payer: Self-pay | Admitting: *Deleted

## 2015-11-07 DIAGNOSIS — C50911 Malignant neoplasm of unspecified site of right female breast: Secondary | ICD-10-CM

## 2015-11-07 MED ORDER — ANASTROZOLE 1 MG PO TABS: 1.0000 mg | ORAL_TABLET | Freq: Every day | ORAL | Status: DC

## 2015-11-27 DIAGNOSIS — Z6841 Body Mass Index (BMI) 40.0 and over, adult: Secondary | ICD-10-CM | POA: Insufficient documentation

## 2015-11-27 DIAGNOSIS — R1084 Generalized abdominal pain: Secondary | ICD-10-CM | POA: Insufficient documentation

## 2015-12-04 DIAGNOSIS — E1165 Type 2 diabetes mellitus with hyperglycemia: Secondary | ICD-10-CM | POA: Insufficient documentation

## 2015-12-04 DIAGNOSIS — I10 Essential (primary) hypertension: Secondary | ICD-10-CM | POA: Insufficient documentation

## 2015-12-05 ENCOUNTER — Other Ambulatory Visit: Payer: Self-pay | Admitting: Internal Medicine

## 2015-12-05 DIAGNOSIS — R1084 Generalized abdominal pain: Secondary | ICD-10-CM

## 2015-12-13 ENCOUNTER — Ambulatory Visit
Admission: RE | Admit: 2015-12-13 | Discharge: 2015-12-13 | Disposition: A | Discharge status: Home / Self Care | Payer: Medicare Other | Source: Ambulatory Visit | Attending: Internal Medicine | Admitting: Internal Medicine

## 2015-12-13 ENCOUNTER — Encounter: Payer: Self-pay | Admitting: Emergency Medicine

## 2015-12-13 ENCOUNTER — Inpatient Hospital Stay
Admission: EM | Admit: 2015-12-13 | Discharge: 2015-12-20 | DRG: 354 | Disposition: A | Discharge status: Home Health | Payer: Medicare Other | Attending: Surgery | Admitting: Surgery

## 2015-12-13 DIAGNOSIS — Z7984 Long term (current) use of oral hypoglycemic drugs: Secondary | ICD-10-CM

## 2015-12-13 DIAGNOSIS — Z6841 Body Mass Index (BMI) 40.0 and over, adult: Secondary | ICD-10-CM | POA: Diagnosis not present

## 2015-12-13 DIAGNOSIS — K450 Other specified abdominal hernia with obstruction, without gangrene: Secondary | ICD-10-CM

## 2015-12-13 DIAGNOSIS — E119 Type 2 diabetes mellitus without complications: Secondary | ICD-10-CM | POA: Diagnosis present

## 2015-12-13 DIAGNOSIS — E669 Obesity, unspecified: Secondary | ICD-10-CM | POA: Diagnosis not present

## 2015-12-13 DIAGNOSIS — Z86718 Personal history of other venous thrombosis and embolism: Secondary | ICD-10-CM

## 2015-12-13 DIAGNOSIS — Z8249 Family history of ischemic heart disease and other diseases of the circulatory system: Secondary | ICD-10-CM | POA: Diagnosis not present

## 2015-12-13 DIAGNOSIS — Z7901 Long term (current) use of anticoagulants: Secondary | ICD-10-CM | POA: Diagnosis not present

## 2015-12-13 DIAGNOSIS — Z79899 Other long term (current) drug therapy: Secondary | ICD-10-CM

## 2015-12-13 DIAGNOSIS — Z7982 Long term (current) use of aspirin: Secondary | ICD-10-CM

## 2015-12-13 DIAGNOSIS — R791 Abnormal coagulation profile: Secondary | ICD-10-CM | POA: Diagnosis present

## 2015-12-13 DIAGNOSIS — M1990 Unspecified osteoarthritis, unspecified site: Secondary | ICD-10-CM | POA: Diagnosis present

## 2015-12-13 DIAGNOSIS — I10 Essential (primary) hypertension: Secondary | ICD-10-CM | POA: Diagnosis present

## 2015-12-13 DIAGNOSIS — M810 Age-related osteoporosis without current pathological fracture: Secondary | ICD-10-CM | POA: Diagnosis present

## 2015-12-13 DIAGNOSIS — Z8711 Personal history of peptic ulcer disease: Secondary | ICD-10-CM | POA: Diagnosis not present

## 2015-12-13 DIAGNOSIS — K5660 Unspecified intestinal obstruction: Secondary | ICD-10-CM

## 2015-12-13 DIAGNOSIS — Z853 Personal history of malignant neoplasm of breast: Secondary | ICD-10-CM

## 2015-12-13 DIAGNOSIS — K469 Unspecified abdominal hernia without obstruction or gangrene: Secondary | ICD-10-CM | POA: Insufficient documentation

## 2015-12-13 DIAGNOSIS — K436 Other and unspecified ventral hernia with obstruction, without gangrene: Secondary | ICD-10-CM | POA: Diagnosis present

## 2015-12-13 DIAGNOSIS — Z833 Family history of diabetes mellitus: Secondary | ICD-10-CM

## 2015-12-13 DIAGNOSIS — K429 Umbilical hernia without obstruction or gangrene: Secondary | ICD-10-CM | POA: Insufficient documentation

## 2015-12-13 DIAGNOSIS — R1033 Periumbilical pain: Secondary | ICD-10-CM | POA: Diagnosis present

## 2015-12-13 DIAGNOSIS — I739 Peripheral vascular disease, unspecified: Secondary | ICD-10-CM | POA: Diagnosis present

## 2015-12-13 DIAGNOSIS — Z880 Allergy status to penicillin: Secondary | ICD-10-CM | POA: Diagnosis not present

## 2015-12-13 DIAGNOSIS — K430 Incisional hernia with obstruction, without gangrene: Secondary | ICD-10-CM | POA: Diagnosis present

## 2015-12-13 DIAGNOSIS — H409 Unspecified glaucoma: Secondary | ICD-10-CM | POA: Diagnosis present

## 2015-12-13 DIAGNOSIS — R1084 Generalized abdominal pain: Secondary | ICD-10-CM

## 2015-12-13 LAB — COMPREHENSIVE METABOLIC PANEL
ALK PHOS: 68 U/L (ref 38–126)
ALT: 26 U/L (ref 14–54)
AST: 33 U/L (ref 15–41)
Albumin: 3.9 g/dL (ref 3.5–5.0)
Anion gap: 12 (ref 5–15)
BUN: 22 mg/dL — AB (ref 6–20)
CALCIUM: 9.4 mg/dL (ref 8.9–10.3)
CHLORIDE: 94 mmol/L — AB (ref 101–111)
CO2: 23 mmol/L (ref 22–32)
CREATININE: 1.26 mg/dL — AB (ref 0.44–1.00)
GFR calc Af Amer: 44 mL/min — ABNORMAL LOW (ref >60)
GFR, EST NON AFRICAN AMERICAN: 38 mL/min — AB (ref >60)
Glucose, Bld: 110 mg/dL — ABNORMAL HIGH (ref 65–99)
Potassium: 4.2 mmol/L (ref 3.5–5.1)
Sodium: 129 mmol/L — ABNORMAL LOW (ref 135–145)
Total Bilirubin: 0.3 mg/dL (ref 0.3–1.2)
Total Protein: 7.5 g/dL (ref 6.5–8.1)

## 2015-12-13 LAB — ABO/RH: ABO/RH(D): O POS

## 2015-12-13 LAB — CBC WITH DIFFERENTIAL/PLATELET
Basophils Absolute: 0.2 10*3/uL — ABNORMAL HIGH (ref 0–0.1)
Basophils Relative: 1 %
Eosinophils Absolute: 0.1 10*3/uL (ref 0–0.7)
HCT: 36.1 % (ref 35.0–47.0)
Hemoglobin: 11.9 g/dL — ABNORMAL LOW (ref 12.0–16.0)
LYMPHS ABS: 2.6 10*3/uL (ref 1.0–3.6)
MCH: 30.5 pg (ref 26.0–34.0)
MCHC: 32.8 g/dL (ref 32.0–36.0)
MCV: 92.9 fL (ref 80.0–100.0)
MONO ABS: 0.7 10*3/uL (ref 0.2–0.9)
Neutro Abs: 8.9 10*3/uL — ABNORMAL HIGH (ref 1.4–6.5)
Neutrophils Relative %: 71 %
PLATELETS: 262 10*3/uL (ref 150–440)
RBC: 3.89 MIL/uL (ref 3.80–5.20)
RDW: 14.5 % (ref 11.5–14.5)
WBC: 12.6 10*3/uL — AB (ref 3.6–11.0)

## 2015-12-13 LAB — LIPASE, BLOOD: Lipase: 30 U/L (ref 11–51)

## 2015-12-13 LAB — PROTIME-INR
INR: 3.79
Prothrombin Time: 36.5 seconds — ABNORMAL HIGH (ref 11.4–15.0)

## 2015-12-13 LAB — TYPE AND SCREEN
ABO/RH(D): O POS
Antibody Screen: NEGATIVE

## 2015-12-13 MED ORDER — ATENOLOL 25 MG PO TABS
25.0000 mg | ORAL_TABLET | Freq: Every evening | ORAL | Status: DC
  Administered 2015-12-13 – 2015-12-16 (×4): 25 mg via ORAL
  Filled 2015-12-13 (×4): qty 1

## 2015-12-13 MED ORDER — LINAGLIPTIN 5 MG PO TABS
5.0000 mg | ORAL_TABLET | Freq: Every day | ORAL | Status: DC
  Administered 2015-12-13: 5 mg via ORAL
  Filled 2015-12-13: qty 1

## 2015-12-13 MED ORDER — LACTATED RINGERS IV SOLN
INTRAVENOUS | Status: DC
  Administered 2015-12-13 – 2015-12-14 (×2): via INTRAVENOUS

## 2015-12-13 MED ORDER — ALLOPURINOL 300 MG PO TABS
150.0000 mg | ORAL_TABLET | Freq: Every day | ORAL | Status: DC
  Administered 2015-12-13 – 2015-12-20 (×8): 150 mg via ORAL
  Filled 2015-12-13 (×8): qty 1

## 2015-12-13 MED ORDER — ANASTROZOLE 1 MG PO TABS
1.0000 mg | ORAL_TABLET | Freq: Every day | ORAL | Status: DC
  Administered 2015-12-13 – 2015-12-20 (×8): 1 mg via ORAL
  Filled 2015-12-13 (×9): qty 1

## 2015-12-13 MED ORDER — PANTOPRAZOLE SODIUM 40 MG PO TBEC
40.0000 mg | DELAYED_RELEASE_TABLET | Freq: Every day | ORAL | Status: DC
  Administered 2015-12-13 – 2015-12-20 (×8): 40 mg via ORAL
  Filled 2015-12-13 (×8): qty 1

## 2015-12-13 MED ORDER — INSULIN ASPART 100 UNIT/ML ~~LOC~~ SOLN
0.0000 [IU] | Freq: Three times a day (TID) | SUBCUTANEOUS | Status: DC
  Administered 2015-12-15 – 2015-12-16 (×3): 1 [IU] via SUBCUTANEOUS
  Administered 2015-12-16 (×2): 2 [IU] via SUBCUTANEOUS
  Administered 2015-12-17: 1 [IU] via SUBCUTANEOUS
  Administered 2015-12-17 (×2): 2 [IU] via SUBCUTANEOUS
  Administered 2015-12-18 (×2): 1 [IU] via SUBCUTANEOUS
  Administered 2015-12-19 (×2): 2 [IU] via SUBCUTANEOUS
  Administered 2015-12-19 – 2015-12-20 (×2): 1 [IU] via SUBCUTANEOUS
  Filled 2015-12-13: qty 1
  Filled 2015-12-13: qty 2
  Filled 2015-12-13: qty 1
  Filled 2015-12-13: qty 2
  Filled 2015-12-13 (×2): qty 1
  Filled 2015-12-13: qty 2
  Filled 2015-12-13 (×2): qty 1
  Filled 2015-12-13: qty 2
  Filled 2015-12-13: qty 1
  Filled 2015-12-13 (×2): qty 2
  Filled 2015-12-13: qty 1

## 2015-12-13 MED ORDER — LATANOPROST 0.005 % OP SOLN
1.0000 [drp] | Freq: Every day | OPHTHALMIC | Status: DC
  Administered 2015-12-14 – 2015-12-19 (×6): 1 [drp] via OPHTHALMIC
  Filled 2015-12-13 (×4): qty 2.5

## 2015-12-13 MED ORDER — ONDANSETRON HCL 4 MG PO TABS: 4.0000 mg | ORAL_TABLET | Freq: Four times a day (QID) | ORAL | Status: DC | PRN

## 2015-12-13 MED ORDER — HEPARIN SODIUM (PORCINE) 5000 UNIT/ML IJ SOLN: 5000.0000 [IU] | Freq: Three times a day (TID) | INTRAMUSCULAR | Status: DC

## 2015-12-13 MED ORDER — HYDROCHLOROTHIAZIDE 12.5 MG PO CAPS: 12.5000 mg | ORAL_CAPSULE | ORAL | Status: DC

## 2015-12-13 MED ORDER — RISEDRONATE SODIUM 150 MG PO TABS: 150.0000 mg | ORAL_TABLET | ORAL | Status: DC

## 2015-12-13 MED ORDER — LISINOPRIL 20 MG PO TABS
30.0000 mg | ORAL_TABLET | Freq: Two times a day (BID) | ORAL | Status: DC
  Administered 2015-12-13 – 2015-12-19 (×9): 30 mg via ORAL
  Filled 2015-12-13 (×11): qty 1

## 2015-12-13 MED ORDER — MORPHINE SULFATE (PF) 4 MG/ML IV SOLN
4.0000 mg | Freq: Once | INTRAVENOUS | Status: AC
  Administered 2015-12-13: 4 mg via INTRAVENOUS
  Filled 2015-12-13: qty 1

## 2015-12-13 MED ORDER — MORPHINE SULFATE (PF) 2 MG/ML IV SOLN
2.0000 mg | INTRAVENOUS | Status: DC | PRN
  Administered 2015-12-13 – 2015-12-14 (×3): 2 mg via INTRAVENOUS
  Filled 2015-12-13 (×3): qty 1

## 2015-12-13 MED ORDER — ONDANSETRON HCL 4 MG/2ML IJ SOLN
4.0000 mg | Freq: Once | INTRAMUSCULAR | Status: AC
  Administered 2015-12-13: 4 mg via INTRAVENOUS
  Filled 2015-12-13: qty 2

## 2015-12-13 MED ORDER — PIOGLITAZONE HCL 30 MG PO TABS
30.0000 mg | ORAL_TABLET | Freq: Every day | ORAL | Status: DC
  Filled 2015-12-13 (×3): qty 1

## 2015-12-13 MED ORDER — IOPAMIDOL (ISOVUE-300) INJECTION 61%
100.0000 mL | Freq: Once | INTRAVENOUS | Status: AC | PRN
  Administered 2015-12-13: 100 mL via INTRAVENOUS

## 2015-12-13 MED ORDER — SODIUM CHLORIDE 0.9 % IV BOLUS (SEPSIS)
500.0000 mL | Freq: Once | INTRAVENOUS | Status: AC
  Administered 2015-12-13: 500 mL via INTRAVENOUS

## 2015-12-13 MED ORDER — ONDANSETRON HCL 4 MG/2ML IJ SOLN
4.0000 mg | Freq: Four times a day (QID) | INTRAMUSCULAR | Status: DC | PRN
  Administered 2015-12-13: 4 mg via INTRAVENOUS
  Filled 2015-12-13: qty 2

## 2015-12-13 NOTE — H&P (Signed)
[name] is an 82 y.o. female.    Chief Complaint: Incarcerated recurrent ventral hernia  HPI: This a patient who is a very poor historian but her husband is present to help with some of the details. She presents with several weeks of abdominal pain she had a CT scan today is now patient that suggested an incarcerated hernia and I was asked see the patient for those findings. Nausea but no emesis she's had a poor appetite over several months as not lost any weight she's had constipation but no melena or hematochezia. She has had a right hemicolectomy for a precancerous lesion many years ago by Dr. Rochel Brome she also had a ventral hernia with incarceration repaired without mesh by Dr. Rexene Edison last year. She presents now with pain nausea but no emesis no fevers or chills. A CT scan done as an outpatient and requiring her admission to the emergency room suggested incarcerated ventral hernia  She does have multiple medical problems including precancerous colon lesions breast cancer with surgery and radiation and prior hernia repairs Patient is anticoagulated on Coumadin Past Medical History  Diagnosis Date  . Arthritis   . Osteoporosis   . Glaucoma   . Hypertension   . Gastric ulcer   . Hiatal hernia   . Diabetes mellitus without complication ([place])   . Recurrent deep vein thrombosis (DVT) ([place])   . Breast cancer [facility]) 2014    right- radiation    Past Surgical History  Procedure Laterality Date  . Appendectomy    . Tonsillectomy    . Replacement total knee    . Partial colectomy    . Ventral hernia repair N/A 06/04/2015    Procedure:  REPAIR of INCARCERATED VENTRAL HERNIA;  Surgeon: Marlyce Huge, MD;  Location: [facility] ORS;  Service: General;  Laterality: N/A;  . Breast biopsy Right 2014    Family History  Problem Relation Age of Onset  . Diabetes Mother   . Hypertension Mother   . Deep vein thrombosis Mother   . Breast cancer Neg Hx    Social History:  reports  that she has never smoked. She does not have any smokeless tobacco history on file. She reports that she does not drink alcohol or use illicit drugs.  Allergies:  Allergies  Allergen Reactions  . Metformin Diarrhea  . Oxycodone-Acetaminophen Nausea And Vomiting  . Amoxicillin Rash and Other (See Comments)    Has patient had a PCN reaction causing immediate rash, facial/tongue/throat swelling, SOB or lightheadedness with hypotension: No Has patient had a PCN reaction causing severe rash involving mucus membranes or skin necrosis: No Has patient had a PCN reaction that required hospitalization No Has patient had a PCN reaction occurring within the last 10 years: No If all of the above answers are "NO", then may proceed with Cephalosporin use.     (Not in a hospital admission)   Review of Systems  Constitutional: Negative for fever and chills.  HENT: Negative.   Eyes: Negative.   Respiratory: Negative for cough, hemoptysis, sputum production and shortness of breath.   Cardiovascular: Negative for chest pain, palpitations and orthopnea.  Gastrointestinal: Positive for nausea, abdominal pain and constipation. Negative for heartburn, vomiting, diarrhea, blood in stool and melena.  Genitourinary: Negative.   Musculoskeletal: Negative.   Skin: Negative.   Neurological: Negative.   Endo/Heme/Allergies: Negative.   Psychiatric/Behavioral: Negative.      Physical Exam:  BP 164/70 mmHg  Pulse 75  Resp 24  SpO2 98%  Physical Exam  Constitutional: She is oriented to person, place, and time. No distress.  Anxious appearing but in no acute distress Morbidly obese patient  HENT:  Head: Normocephalic and atraumatic.  Alopecic  Eyes: Pupils are equal, round, and reactive to light. Right eye exhibits no discharge. Left eye exhibits no discharge. No scleral icterus.  Neck: Normal range of motion.  Cardiovascular: Normal rate, regular rhythm and normal heart sounds.   Pulmonary/Chest:  Effort normal and breath sounds normal. No respiratory distress. She has no wheezes. She has no rales.  Abdominal: Soft. She exhibits no distension. There is no tenderness. There is no rebound and no guarding.  2 separate ventral hernias in the supraumbilical area on a midline incision which are easily reducible soft and nontender without overlying erythema  Musculoskeletal: Normal range of motion. She exhibits edema. She exhibits no tenderness.  Lymphadenopathy:    She has no cervical adenopathy.  Neurological: She is alert and oriented to person, place, and time.  Skin: Skin is warm and dry. No rash noted. She is not diaphoretic. No erythema.  Psychiatric:  Frightened and apprehensive Disheveled  Vitals reviewed.       Results for orders placed or performed during the hospital encounter of 12/13/15 (from the past 48 hour(s))  Comprehensive metabolic panel     Status: Abnormal   Collection Time: 12/13/15  4:03 PM  Result Value Ref Range   Sodium 129 (L) 135 - 145 mmol/L   Potassium 4.2 3.5 - 5.1 mmol/L   Chloride 94 (L) 101 - 111 mmol/L   CO2 23 22 - 32 mmol/L   Glucose, Bld 110 (H) 65 - 99 mg/dL   BUN 22 (H) 6 - 20 mg/dL   Creatinine, Ser 1.26 (H) 0.44 - 1.00 mg/dL   Calcium 9.4 8.9 - 10.3 mg/dL   Total Protein 7.5 6.5 - 8.1 g/dL   Albumin 3.9 3.5 - 5.0 g/dL   AST 33 15 - 41 U/L   ALT 26 14 - 54 U/L   Alkaline Phosphatase 68 38 - 126 U/L   Total Bilirubin 0.3 0.3 - 1.2 mg/dL   GFR calc non Af Amer 38 (L) >60 mL/min   GFR calc Af Amer 44 (L) >60 mL/min    Comment: (NOTE) The eGFR has been calculated using the CKD EPI equation. This calculation has not been validated in all clinical situations. eGFR's persistently <60 mL/min signify possible Chronic Kidney Disease.    Anion gap 12 5 - 15  CBC with Differential     Status: Abnormal   Collection Time: 12/13/15  4:03 PM  Result Value Ref Range   WBC 12.6 (H) 3.6 - 11.0 K/uL   RBC 3.89 3.80 - 5.20 MIL/uL   Hemoglobin  11.9 (L) 12.0 - 16.0 g/dL   HCT 36.1 35.0 - 47.0 %   MCV 92.9 80.0 - 100.0 fL   MCH 30.5 26.0 - 34.0 pg   MCHC 32.8 32.0 - 36.0 g/dL   RDW 14.5 11.5 - 14.5 %   Platelets 262 150 - 440 K/uL   Neutrophils Relative % 71% %   Neutro Abs 8.9 (H) 1.4 - 6.5 K/uL   Lymphocytes Relative 21% %   Lymphs Abs 2.6 1.0 - 3.6 K/uL   Monocytes Relative 6% %   Monocytes Absolute 0.7 0.2 - 0.9 K/uL   Eosinophils Relative 1% %   Eosinophils Absolute 0.1 0 - 0.7 K/uL   Basophils Relative 1% %   Basophils Absolute 0.2 (H)  0 - 0.1 K/uL  Lipase, blood     Status: None   Collection Time: 12/13/15  4:03 PM  Result Value Ref Range   Lipase 30 11 - 51 U/L  Protime-INR     Status: Abnormal   Collection Time: 12/13/15  4:03 PM  Result Value Ref Range   Prothrombin Time 36.5 (H) 11.4 - 15.0 seconds   INR 3.79   Type and screen [place]     Status: None (Preliminary result)   Collection Time: 12/13/15  4:03 PM  Result Value Ref Range   ABO/RH(D) PENDING    Antibody Screen PENDING    Sample Expiration 12/16/2015    Ct Abdomen Pelvis W Contrast  12/13/2015  CLINICAL DATA:  Progressive lower abdominal pain. Chronic periumbilical hernias. EXAM: CT ABDOMEN AND PELVIS WITH CONTRAST TECHNIQUE: Multidetector CT imaging of the abdomen and pelvis was performed using the standard protocol following bolus administration of intravenous contrast. CONTRAST:  163m ISOVUE-300 IOPAMIDOL (ISOVUE-300) INJECTION 61% COMPARISON:  None. FINDINGS: Lower chest: No acute abnormalities. Slight coronary artery calcification. Slight chronic changes at the lung bases. Hepatobiliary: Normal. Pancreas: Normal. Spleen: Normal. Adrenals/Urinary Tract: Normal. Stomach/Bowel: The patient has an incarcerated periumbilical hernia just below the umbilicus. There is a loop of small bowel which extends into the hernia sac and is dilated proximal to the sac and decompressed distal to the sac consistent with partial obstruction.  There is edema in the soft tissues around the hernia and there is also a small amount of fluid in the hernia sac. There is also a focal hernia just above the umbilicus without incarceration. The patient has had a hemicolectomy. There are multiple diverticula in the remaining portion of the colon. Vascular/Lymphatic: Minimal calcification in the abdominal aorta. No adenopathy. Reproductive: Atrophic uterus and ovaries are normal. Other: No free air or free fluid. Musculoskeletal: No acute bone abnormality. Moderately severe right hip arthritis. Moderate left hip arthritis. Degenerative disc and facet joint disease in the lumbar spine. IMPRESSION: Incarcerated periumbilical hernia containing a loop of small bowel creating partial small bowel obstruction. Inflammation in and around the hernia sac. Critical Value/emergent results were called by telephone at the time of interpretation on 12/13/2015 at ~2:53pm to Dr. BRamonita Lab, who verbally acknowledged these results. Electronically Signed   By: JLorriane ShireM.D.   On: 12/13/2015 15:59     Assessment/Plan  This patient came to the emergency room CT findings of an incarcerated ventral hernia. On history taking and physical exam is clear that she has an incarcerated recurrent ventral hernia but this is easily reducible and is showing no signs of acute incarceration or bowel obstruction. He is also anticoagulated which she had not told me but I noticed that in her medications and her PT/INR once those laboratory values returned. With that in mind my initial consideration was to bring her into the hospital and perform surgery tomorrow but with her INR being 3.7 on Coumadin I will have to let her decrease her level and proceed to surgery later in the weekend or early in the week. I will ask internal medicine to assist with her care and proceed to surgery utilizing mesh in the near future currently there is no sign of incarceration  RFlorene Glen MD,  FACS

## 2015-12-13 NOTE — ED Notes (Signed)
Dr. Stafford at bedside.  

## 2015-12-13 NOTE — Consult Note (Signed)
[name] at [place] NAME: [name]    MR#:  [id number]  DATE OF BIRTH:  Jul 02, 1929  DATE OF ADMISSION:  12/13/2015  PRIMARY CARE PHYSICIAN: Adin Hector, MD   REQUESTING/REFERRING PHYSICIAN: Burt Knack  CHIEF COMPLAINT:   Chief Complaint  Patient presents with  . Hernia    HISTORY OF PRESENT ILLNESS: [name]  is a 82 y.o. female with a known history of Arthritis, glaucoma, hypertension, hiatal hernia, diabetes, recurrent deep vein thrombosis, breast cancer- she had repeated surgeries for incarcerated hernia. For last 1-2 weeks again she started having pain in her the abdomen and at the hernia site and so she came to emergency room today and on CT scan found to have incarcerated hernia. Surgical team is going to admit the patient and most likely planning for surgery tomorrow, medical's consult is called in to manage her medical issues along with while she is in the hospital.   PAST MEDICAL HISTORY:   Past Medical History  Diagnosis Date  . Arthritis   . Osteoporosis   . Glaucoma   . Hypertension   . Gastric ulcer   . Hiatal hernia   . Diabetes mellitus without complication ([place])   . Recurrent deep vein thrombosis (DVT) ([facility])   . Breast cancer ([place]) 2014    right- radiation    PAST SURGICAL HISTORY: Past Surgical History  Procedure Laterality Date  . Appendectomy    . Tonsillectomy    . Replacement total knee    . Partial colectomy    . Ventral hernia repair N/A 06/04/2015    Procedure:  REPAIR of INCARCERATED VENTRAL HERNIA;  Surgeon: Marlyce Huge, MD;  Location: [facility] ORS;  Service: General;  Laterality: N/A;  . Breast biopsy Right 2014    SOCIAL HISTORY:  Social History  Substance Use Topics  . Smoking status: Never Smoker   . Smokeless tobacco: Not on file  . Alcohol Use: No    FAMILY HISTORY:  Family History  Problem Relation Age of Onset  . Diabetes Mother   . Hypertension Mother   . Deep vein  thrombosis Mother   . Breast cancer Neg Hx     DRUG ALLERGIES:  Allergies  Allergen Reactions  . Metformin Diarrhea  . Oxycodone-Acetaminophen Nausea And Vomiting  . Amoxicillin Rash and Other (See Comments)    Has patient had a PCN reaction causing immediate rash, facial/tongue/throat swelling, SOB or lightheadedness with hypotension: No Has patient had a PCN reaction causing severe rash involving mucus membranes or skin necrosis: No Has patient had a PCN reaction that required hospitalization No Has patient had a PCN reaction occurring within the last 10 years: No If all of the above answers are "NO", then may proceed with Cephalosporin use.    REVIEW OF SYSTEMS:   CONSTITUTIONAL: No fever, fatigue or weakness.  EYES: No blurred or double vision.  EARS, NOSE, AND THROAT: No tinnitus or ear pain.  RESPIRATORY: No cough, shortness of breath, wheezing or hemoptysis.  CARDIOVASCULAR: No chest pain, orthopnea, edema.  GASTROINTESTINAL:  Some nausea,no vomiting, diarrhea , have abdominal pain.  GENITOURINARY: No dysuria, hematuria.  ENDOCRINE: No polyuria, nocturia,  HEMATOLOGY: No anemia, easy bruising or bleeding SKIN: No rash or lesion. MUSCULOSKELETAL: No joint pain or arthritis.   NEUROLOGIC: No tingling, numbness, weakness.  PSYCHIATRY: No anxiety or depression.   MEDICATIONS AT HOME:  Prior to Admission medications   Medication Sig Start Date End Date Taking? Authorizing  Provider  allopurinol (ZYLOPRIM) 300 MG tablet Take 150 mg by mouth daily.    Yes Historical Provider, MD  anastrozole (ARIMIDEX) 1 MG tablet Take 1 tablet (1 mg total) by mouth daily. 11/07/15  Yes Lloyd Huger, MD  ascorbic acid (VITAMIN C) 1000 MG tablet Take 1,000 mg by mouth daily.   Yes Historical Provider, MD  aspirin EC 81 MG tablet Take 81 mg by mouth every evening.   Yes Historical Provider, MD  atenolol (TENORMIN) 25 MG tablet Take 25 mg by mouth every evening.    Yes Historical Provider, MD   Calcium-Magnesium-Vitamin D (CALCIUM 1200+D3) 600-40-500 MG-MG-UNIT TB24 Take 1 tablet by mouth 2 (two) times daily.   Yes Historical Provider, MD  cholecalciferol (VITAMIN D) 1000 UNITS tablet Take 1,000 Units by mouth daily.   Yes Historical Provider, MD  docusate sodium (COLACE) 100 MG capsule Take 100 mg by mouth at bedtime as needed for mild constipation.   Yes Historical Provider, MD  FOLIC ACID PO Take 1 tablet by mouth every evening.   Yes Historical Provider, MD  hydrochlorothiazide (MICROZIDE) 12.5 MG capsule Take 12.5 mg by mouth every Monday, Wednesday, and Friday.    Yes Historical Provider, MD  latanoprost (XALATAN) 0.005 % ophthalmic solution Place 1 drop into both eyes at bedtime.    Yes Historical Provider, MD  lisinopril (PRINIVIL,ZESTRIL) 30 MG tablet Take 30 mg by mouth 2 (two) times daily.   Yes Historical Provider, MD  Misc Natural Products (OSTEO BI-FLEX ADV DOUBLE ST PO) Take 1 tablet by mouth 2 (two) times daily.   Yes Historical Provider, MD  Multiple Vitamin (MULTIVITAMIN WITH MINERALS) TABS tablet Take 1 tablet by mouth daily.   Yes Historical Provider, MD  omeprazole (PRILOSEC) 20 MG capsule Take 20 mg by mouth daily. Pt only uses one capsule in the evening if needed.   Yes Historical Provider, MD  pioglitazone (ACTOS) 30 MG tablet Take 30 mg by mouth daily.   Yes Historical Provider, MD  risedronate (ACTONEL) 150 MG tablet Take 150 mg by mouth every 30 (thirty) days. with water on empty stomach, nothing by mouth or lie down for next 30 minutes.   Yes Historical Provider, MD  sitaGLIPtin (JANUVIA) 100 MG tablet Take 100 mg by mouth every evening.    Yes Historical Provider, MD  warfarin (COUMADIN) 6 MG tablet Take 6 mg by mouth every evening.    Yes Historical Provider, MD      PHYSICAL EXAMINATION:   VITAL SIGNS: Blood pressure 182/72, pulse 70, resp. rate 20, SpO2 99 %.  GENERAL:  82 y.o.-year-old patient lying in the bed with no acute distress.  EYES: Pupils  equal, round, reactive to light and accommodation. No scleral icterus. Extraocular muscles intact.  HEENT: Head atraumatic, normocephalic. Oropharynx and nasopharynx clear.  NECK:  Supple, no jugular venous distention. No thyroid enlargement, no tenderness.  LUNGS: Normal breath sounds bilaterally, no wheezing, rales,rhonchi or crepitation. No use of accessory muscles of respiration.  CARDIOVASCULAR: S1, S2 normal. No murmurs, rubs, or gallops.  ABDOMEN: Soft,  Mild tender, nondistended. Bowel sounds present. No organomegaly or mass. Central abdominal incisional hernia present and it is tender.  EXTREMITIES: No pedal edema, cyanosis, or clubbing.  NEUROLOGIC: Cranial nerves II through XII are intact. Muscle strength 5/5 in all extremities. Sensation intact. Gait not checked.  PSYCHIATRIC: The patient is alert and oriented x 3.  SKIN: No obvious rash, lesion, or ulcer.   LABORATORY PANEL:   CBC  Recent  Labs Lab 12/13/15 1603  WBC 12.6*  HGB 11.9*  HCT 36.1  PLT 262  MCV 92.9  MCH 30.5  MCHC 32.8  RDW 14.5  LYMPHSABS 2.6  MONOABS 0.7  EOSABS 0.1  BASOSABS 0.2*   ------------------------------------------------------------------------------------------------------------------  Chemistries   Recent Labs Lab 12/13/15 1603  NA 129*  K 4.2  CL 94*  CO2 23  GLUCOSE 110*  BUN 22*  CREATININE 1.26*  CALCIUM 9.4  AST 33  ALT 26  ALKPHOS 68  BILITOT 0.3   ------------------------------------------------------------------------------------------------------------------ CrCl cannot be calculated (Unknown ideal weight.). ------------------------------------------------------------------------------------------------------------------ No results for input(s): TSH, T4TOTAL, T3FREE, THYROIDAB in the last 72 hours.  Invalid input(s): FREET3   Coagulation profile  Recent Labs Lab 12/13/15 1603  INR 3.79    ------------------------------------------------------------------------------------------------------------------- No results for input(s): DDIMER in the last 72 hours. -------------------------------------------------------------------------------------------------------------------  Cardiac Enzymes No results for input(s): CKMB, TROPONINI, MYOGLOBIN in the last 168 hours.  Invalid input(s): CK ------------------------------------------------------------------------------------------------------------------ Invalid input(s): POCBNP  ---------------------------------------------------------------------------------------------------------------  Urinalysis    Component Value Date/Time   COLORURINE YELLOW* 06/21/2015 1100   APPEARANCEUR CLEAR* 06/21/2015 1100   LABSPEC 1.012 06/21/2015 1100   PHURINE 5.0 06/21/2015 1100   GLUCOSEU NEGATIVE 06/21/2015 1100   HGBUR NEGATIVE 06/21/2015 1100   BILIRUBINUR NEGATIVE 06/21/2015 1100   KETONESUR NEGATIVE 06/21/2015 1100   PROTEINUR NEGATIVE 06/21/2015 1100   NITRITE NEGATIVE 06/21/2015 1100   LEUKOCYTESUR NEGATIVE 06/21/2015 1100     RADIOLOGY: Ct Abdomen Pelvis W Contrast  12/13/2015  CLINICAL DATA:  Progressive lower abdominal pain. Chronic periumbilical hernias. EXAM: CT ABDOMEN AND PELVIS WITH CONTRAST TECHNIQUE: Multidetector CT imaging of the abdomen and pelvis was performed using the standard protocol following bolus administration of intravenous contrast. CONTRAST:  153mL ISOVUE-300 IOPAMIDOL (ISOVUE-300) INJECTION 61% COMPARISON:  None. FINDINGS: Lower chest: No acute abnormalities. Slight coronary artery calcification. Slight chronic changes at the lung bases. Hepatobiliary: Normal. Pancreas: Normal. Spleen: Normal. Adrenals/Urinary Tract: Normal. Stomach/Bowel: The patient has an incarcerated periumbilical hernia just below the umbilicus. There is a loop of small bowel which extends into the hernia sac and is dilated proximal to  the sac and decompressed distal to the sac consistent with partial obstruction. There is edema in the soft tissues around the hernia and there is also a small amount of fluid in the hernia sac. There is also a focal hernia just above the umbilicus without incarceration. The patient has had a hemicolectomy. There are multiple diverticula in the remaining portion of the colon. Vascular/Lymphatic: Minimal calcification in the abdominal aorta. No adenopathy. Reproductive: Atrophic uterus and ovaries are normal. Other: No free air or free fluid. Musculoskeletal: No acute bone abnormality. Moderately severe right hip arthritis. Moderate left hip arthritis. Degenerative disc and facet joint disease in the lumbar spine. IMPRESSION: Incarcerated periumbilical hernia containing a loop of small bowel creating partial small bowel obstruction. Inflammation in and around the hernia sac. Critical Value/emergent results were called by telephone at the time of interpretation on 12/13/2015 at ~2:53pm to Dr. Ramonita Lab , who verbally acknowledged these results. Electronically Signed   By: Lorriane Shire M.D.   On: 12/13/2015 15:59    EKG: Orders placed or performed in visit on 01/26/13  . EKG 12-Lead    IMPRESSION AND PLAN:  * Incarcerated hernia   Manage per primary team, plan for surgery tomorrow as per the patient.    * Hypertension   Currently blood pressure is high, part of it could be due to her pain also.   We'll  continue lisinopril and atenolol, but hold hydrochlorothiazide as she is going to undergo surgery and she will be on IV fluid for now.  * Diabetes   She is on clear liquid diet for now and going for surgery tomorrow so she will be not eating anything tonight.   I will hold her oral medications in the hospital for now and keep her on sliding scale coverage.  * History of DVT   INR is more than 3 currently, she is on Coumadin at home.   Hold Coumadin for surgery, we may resume after surgery.  *  Glaucoma   Continue eye drops.  All the records are reviewed and case discussed with ED provider. Management plans discussed with the patient, family and they are in agreement.  CODE STATUS:Full code    Code Status Orders        Start     Ordered   12/13/15 1636  Full code   Continuous     12/13/15 1638    Code Status History    Date Active Date Inactive Code Status Order ID Comments User Context   06/04/2015  2:15 AM 06/06/2015  4:32 PM Full Code [id number]  Marlyce Huge, MD Inpatient     Patient's son and husband present in the room.  TOTAL TIME TAKING CARE OF THIS PATIENT: 50 minutes.    Vaughan Basta M.D on 12/13/2015   Between 7am to 6pm - Pager - [phone]  After 6pm go to [email or web address] - password [email or web address] [place]  Office  [phone]  CC: Primary care physician; Adin Hector, MD   Note: This dictation was prepared with Dragon dictation along with smaller phrase technology. Any transcriptional errors that result from this process are unintentional.

## 2015-12-13 NOTE — ED Notes (Signed)
Attempted to call report x 1  

## 2015-12-13 NOTE — ED Notes (Signed)
Pharmacy called and notified of need of medications. States they will need to be verified then they will send them up. Patient and family made aware.

## 2015-12-13 NOTE — ED Notes (Signed)
Admitting MD at bedside.

## 2015-12-13 NOTE — ED Notes (Signed)
Dr. Cooper at bedside 

## 2015-12-13 NOTE — Progress Notes (Signed)

## 2015-12-13 NOTE — ED Notes (Signed)
Patient last ate at 0900.

## 2015-12-13 NOTE — ED Provider Notes (Signed)
[facility] Emergency Department Provider Note  ____________________________________________  Time seen: 3:30 PM  I have reviewed the triage vital signs and the nursing notes.   HISTORY  Chief Complaint Hernia    HPI [name] is a 82 y.o. female who complains of anterior abdominal pain that's worsening over the past several days. She was sent to CT today from her primary care doctor, and radiology determination she had an incarcerated hernia and sent her straight to the emergency department. Patient reports that her last normal bowel movement was about 4 days ago and that she just had one very small bowel movement daily since then. No vomiting. Ate a normal-sized breakfast at 9 AM today. Fevers chills chest pain or shortness of breath.     Past Medical History  Diagnosis Date  . Arthritis   . Osteoporosis   . Glaucoma   . Hypertension   . Gastric ulcer   . Hiatal hernia   . Diabetes mellitus without complication ([place])   . Recurrent deep vein thrombosis (DVT) ([place])   . Breast cancer [facility]) 2014    right- radiation     Patient Active Problem List   Diagnosis Date Noted  . Recurrent ventral hernia with incarceration 12/13/2015  . Obesity (BMI 35.0-39.9 without comorbidity) ([place])   . Aftercare following surgery 06/20/2015  . Type 2 diabetes mellitus ([place]) 06/18/2015  . Deep vein thrombosis (DVT) of lower extremity ([place]) 06/18/2015  . Acid reflux 06/18/2015  . Arthritis urica 06/18/2015  . BP (high blood pressure) 06/18/2015  . Arthritis, degenerative 06/18/2015  . Osteoporosis, post-menopausal 06/18/2015  . Incarcerated ventral hernia 06/04/2015  . Umbilical hernia, incarcerated   . Body mass index (BMI) of 50-59.9 in adult ([place]) 12/31/2014  . Adenocarcinoma of right breast ([place]) 12/14/2014  . Malignant neoplasm of female breast ([place]) 12/14/2014  . Idiopathic localized osteoarthropathy 09/25/2011     Past Surgical History  Procedure  Laterality Date  . Appendectomy    . Tonsillectomy    . Replacement total knee    . Partial colectomy    . Ventral hernia repair N/A 06/04/2015    Procedure:  REPAIR of INCARCERATED VENTRAL HERNIA;  Surgeon: Marlyce Huge, MD;  Location: [facility] ORS;  Service: General;  Laterality: N/A;  . Breast biopsy Right 2014     Current Outpatient Rx  Name  Route  Sig  Dispense  Refill  . allopurinol (ZYLOPRIM) 300 MG tablet   Oral   Take 150 mg by mouth daily.          Marland Kitchen anastrozole (ARIMIDEX) 1 MG tablet   Oral   Take 1 tablet (1 mg total) by mouth daily.   90 tablet   1   . ascorbic acid (VITAMIN C) 1000 MG tablet   Oral   Take 1,000 mg by mouth daily.         Marland Kitchen aspirin EC 81 MG tablet   Oral   Take 81 mg by mouth every evening.         Marland Kitchen atenolol (TENORMIN) 25 MG tablet   Oral   Take 25 mg by mouth every evening.          . Calcium-Magnesium-Vitamin D (CALCIUM 1200+D3) 600-40-500 MG-MG-UNIT TB24   Oral   Take 1 tablet by mouth 2 (two) times daily.         . cholecalciferol (VITAMIN D) 1000 UNITS tablet   Oral   Take 1,000 Units by mouth daily.         Marland Kitchen  docusate sodium (COLACE) 100 MG capsule   Oral   Take 100 mg by mouth at bedtime as needed for mild constipation.         Marland Kitchen FOLIC ACID PO   Oral   Take 1 tablet by mouth every evening.         . hydrochlorothiazide (MICROZIDE) 12.5 MG capsule   Oral   Take 12.5 mg by mouth every Monday, Wednesday, and Friday.          . latanoprost (XALATAN) 0.005 % ophthalmic solution   Both Eyes   Place 1 drop into both eyes at bedtime.          Marland Kitchen lisinopril (PRINIVIL,ZESTRIL) 30 MG tablet   Oral   Take 30 mg by mouth 2 (two) times daily.         . Misc Natural Products (OSTEO BI-FLEX ADV DOUBLE ST PO)   Oral   Take 1 tablet by mouth 2 (two) times daily.         . Multiple Vitamin (MULTIVITAMIN WITH MINERALS) TABS tablet   Oral   Take 1 tablet by mouth daily.         Marland Kitchen omeprazole  (PRILOSEC) 20 MG capsule   Oral   Take 20 mg by mouth daily. Pt only uses one capsule in the evening if needed.         . pioglitazone (ACTOS) 30 MG tablet   Oral   Take 30 mg by mouth daily.         . risedronate (ACTONEL) 150 MG tablet   Oral   Take 150 mg by mouth every 30 (thirty) days. with water on empty stomach, nothing by mouth or lie down for next 30 minutes.         . sitaGLIPtin (JANUVIA) 100 MG tablet   Oral   Take 100 mg by mouth every evening.          . warfarin (COUMADIN) 6 MG tablet   Oral   Take 6 mg by mouth every evening.             Allergies Metformin; Oxycodone-acetaminophen; and Amoxicillin   Family History  Problem Relation Age of Onset  . Diabetes Mother   . Hypertension Mother   . Deep vein thrombosis Mother   . Breast cancer Neg Hx     Social History Social History  Substance Use Topics  . Smoking status: Never Smoker   . Smokeless tobacco: None  . Alcohol Use: No    Review of Systems  Constitutional:   No fever or chills.  Eyes:   No vision changes.  ENT:   No sore throat. No rhinorrhea. Cardiovascular:   No chest pain. Respiratory:   No dyspnea or cough. Gastrointestinal:   Positive abdominal pain. No vomiting..  Genitourinary:   Negative for dysuria or difficulty urinating. Musculoskeletal:   Negative for focal pain or swelling Neurological:   Negative for headaches 10-point ROS otherwise negative.  ____________________________________________   PHYSICAL EXAM:  VITAL SIGNS: ED Triage Vitals  Enc Vitals Group     BP 12/13/15 1606 164/70 mmHg     Pulse Rate 12/13/15 1606 75     Resp 12/13/15 1606 24     Temp --      Temp src --      SpO2 12/13/15 1606 98 %     Weight --      Height --      Head Cir --  Peak Flow --      Pain Score --      Pain Loc --      Pain Edu? --      Excl. in [place]? --     Vital signs reviewed, nursing assessments reviewed.   Constitutional:   Alert and oriented. No in  distress. Eyes:   No scleral icterus. No conjunctival pallor. PERRL. EOMI.  No nystagmus. ENT   Head:   Normocephalic and atraumatic.   Nose:   No congestion/rhinnorhea. No septal hematoma   Mouth/Throat:   MMM, no pharyngeal erythema. No peritonsillar mass.    Neck:   No stridor. No SubQ emphysema. No meningismus. Hematological/Lymphatic/Immunilogical:   No cervical lymphadenopathy. Cardiovascular:   RRR. Symmetric bilateral radial and DP pulses.  No murmurs.  Respiratory:   Normal respiratory effort without tachypnea nor retractions. Breath sounds are clear and equal bilaterally. No wheezes/rales/rhonchi. Gastrointestinal:   Soft with periumbilical tenderness. There is a 2-3 cm hernia sac palpable in the area of the umbilicus. This is reducible on exam. Not distended. There is no CVA tenderness.  No rebound, rigidity, or guarding. Genitourinary:   deferred Musculoskeletal:   Nontender with normal range of motion in all extremities. No joint effusions.  No lower extremity tenderness.  No edema. Neurologic:   Normal speech and language.  CN 2-10 normal. Motor grossly intact. No gross focal neurologic deficits are appreciated.  Skin:    Skin is warm, dry and intact. No rash noted.  No petechiae, purpura, or bullae. No discoloration or ecchymosis  ____________________________________________    LABS (pertinent positives/negatives) (all labs ordered are listed, but only abnormal results are displayed) Labs Reviewed  COMPREHENSIVE METABOLIC PANEL - Abnormal; Notable for the following:    Sodium 129 (*)    Chloride 94 (*)    Glucose, Bld 110 (*)    BUN 22 (*)    Creatinine, Ser 1.26 (*)    GFR calc non Af Amer 38 (*)    GFR calc Af Amer 44 (*)    All other components within normal limits  CBC WITH DIFFERENTIAL/PLATELET - Abnormal; Notable for the following:    WBC 12.6 (*)    Hemoglobin 11.9 (*)    Neutro Abs 8.9 (*)    Basophils Absolute 0.2 (*)    All other components  within normal limits  PROTIME-INR - Abnormal; Notable for the following:    Prothrombin Time 36.5 (*)    All other components within normal limits  LIPASE, BLOOD  BASIC METABOLIC PANEL  CBC  PROTIME-INR  TYPE AND SCREEN  ABO/RH   ____________________________________________   EKG    ____________________________________________    RADIOLOGY  CT from this afternoon reviewed Incarcerated periumbilical hernia containing a loop of small bowel creating partial small bowel obstruction. Inflammation in and around the hernia sac.  ____________________________________________   PROCEDURES  Ventral abdominal hernia sac reduced by firm manual pressure by myself at the bedside. ____________________________________________   INITIAL IMPRESSION / ASSESSMENT AND PLAN / ED COURSE  Pertinent labs & imaging results that were available during my care of the patient were reviewed by me and considered in my medical decision making (see chart for details).  Patient presents with abdominal pain, found to have a ventral hernia which is incarcerated. This was reduced by myself at the bedside. I discussed with Dr. Burt Knack from surgery to evaluate the patient and because of her risk factors and the rapid recurrence of the hernia is decided that she warrants admission  and operative management. I discussed with the hospitalist for a medicine consult for preoperative evaluation.     ____________________________________________   FINAL CLINICAL IMPRESSION(S) / ED DIAGNOSES  Final diagnoses:  Recurrent ventral hernia with incarceration  Periumbilical abdominal pain       Portions of this note were generated with dragon dictation software. Dictation errors may occur despite best attempts at proofreading.   Carrie Mew, MD 12/13/15 [phone]

## 2015-12-13 NOTE — ED Notes (Addendum)
Per Triage nurse, Otila Kluver, patient comes from her doctors office (Dr. Jens Som) due to a incarcerated hernia. This past November patient had a ventral hernia repair that failed. According to patients husband, this is a new hernia. Patient states, "I'm not in pain its just uncomfortable". Patient is A&O x4. Patient is tearful during triage, emotional support provided. CT has been completed. Patient is on coumadin.

## 2015-12-14 DIAGNOSIS — R1033 Periumbilical pain: Secondary | ICD-10-CM | POA: Insufficient documentation

## 2015-12-14 LAB — CBC
HCT: 34.7 % — ABNORMAL LOW (ref 35.0–47.0)
HEMOGLOBIN: 11.9 g/dL — AB (ref 12.0–16.0)
MCH: 31.3 pg (ref 26.0–34.0)
MCHC: 34.4 g/dL (ref 32.0–36.0)
MCV: 91 fL (ref 80.0–100.0)
PLATELETS: 249 10*3/uL (ref 150–440)
RBC: 3.81 MIL/uL (ref 3.80–5.20)
RDW: 14 % (ref 11.5–14.5)
WBC: 8.4 10*3/uL (ref 3.6–11.0)

## 2015-12-14 LAB — PROTIME-INR
INR: 4.23
Prothrombin Time: 39.6 seconds — ABNORMAL HIGH (ref 11.4–15.0)

## 2015-12-14 LAB — GLUCOSE, CAPILLARY
GLUCOSE-CAPILLARY: 116 mg/dL — AB (ref 65–99)
GLUCOSE-CAPILLARY: 96 mg/dL (ref 65–99)
GLUCOSE-CAPILLARY: 99 mg/dL (ref 65–99)
Glucose-Capillary: 112 mg/dL — ABNORMAL HIGH (ref 65–99)
Glucose-Capillary: 113 mg/dL — ABNORMAL HIGH (ref 65–99)

## 2015-12-14 LAB — BASIC METABOLIC PANEL
ANION GAP: 8 (ref 5–15)
BUN: 18 mg/dL (ref 6–20)
CALCIUM: 8.9 mg/dL (ref 8.9–10.3)
CO2: 24 mmol/L (ref 22–32)
Chloride: 101 mmol/L (ref 101–111)
Creatinine, Ser: 1.2 mg/dL — ABNORMAL HIGH (ref 0.44–1.00)
GFR calc Af Amer: 47 mL/min — ABNORMAL LOW (ref >60)
GFR, EST NON AFRICAN AMERICAN: 40 mL/min — AB (ref >60)
GLUCOSE: 124 mg/dL — AB (ref 65–99)
Potassium: 4.7 mmol/L (ref 3.5–5.1)
Sodium: 133 mmol/L — ABNORMAL LOW (ref 135–145)

## 2015-12-14 MED ORDER — HYDRALAZINE HCL 20 MG/ML IJ SOLN: 10.0000 mg | Freq: Four times a day (QID) | INTRAMUSCULAR | Status: DC | PRN

## 2015-12-14 NOTE — Progress Notes (Signed)
CC: Ventral hernia Subjective: This a patient with a recurrent ventral hernia which was incarcerated but spontaneously and easily reduced in the emergency room yesterday she is admitted to the hospital for elective repair but it was noted that she was also on Coumadin with an INR approaching 4 and now over 4.  She describes minimal abdominal pain and actually feels better today but she does have some nausea no emesis rales  Objective: Vital signs in last 24 hours: Temp:  [97.6 F (36.4 C)-98.2 F (36.8 C)] 98.2 F (36.8 C) (06/03 0411) Pulse Rate:  [70-108] 108 (06/03 0411) Resp:  [16-24] 20 (06/03 0411) BP: (139-182)/(45-88) 167/88 mmHg (06/03 0411) SpO2:  [96 %-99 %] 99 % (06/03 0411) Weight:  [251 lb 8 oz (114.08 kg)] 251 lb 8 oz (114.08 kg) (06/02 1804) Last BM Date: 12/14/15  Intake/Output from previous day: 06/02 0701 - 06/03 0700 In: 1199.7 [P.O.:240; I.V.:959.7] Out: 4 [Urine:4] Intake/Output this shift: Total I/O In: 0  Out: 2 [Urine:1; Stool:1]  Physical exam:  Abdomen is soft nondistended nontympanitic and nontender both hernias are easily reducible no erythema nontender.  Calves are nontender.  Patient awake and alert  Lab Results: CBC   Recent Labs  12/13/15 1603 12/14/15 0529  WBC 12.6* 8.4  HGB 11.9* 11.9*  HCT 36.1 34.7*  PLT 262 249   BMET  Recent Labs  12/13/15 1603 12/14/15 0529  NA 129* 133*  K 4.2 4.7  CL 94* 101  CO2 23 24  GLUCOSE 110* 124*  BUN 22* 18  CREATININE 1.26* 1.20*  CALCIUM 9.4 8.9   PT/INR  Recent Labs  12/13/15 1603 12/14/15 0529  LABPROT 36.5* 39.6*  INR 3.79 4.23*   ABG No results for input(s): PHART, HCO3 in the last 72 hours.  Invalid input(s): PCO2, PO2  Studies/Results: Ct Abdomen Pelvis W Contrast  12/13/2015  CLINICAL DATA:  Progressive lower abdominal pain. Chronic periumbilical hernias. EXAM: CT ABDOMEN AND PELVIS WITH CONTRAST TECHNIQUE: Multidetector CT imaging of the abdomen and pelvis was  performed using the standard protocol following bolus administration of intravenous contrast. CONTRAST:  197mL ISOVUE-300 IOPAMIDOL (ISOVUE-300) INJECTION 61% COMPARISON:  None. FINDINGS: Lower chest: No acute abnormalities. Slight coronary artery calcification. Slight chronic changes at the lung bases. Hepatobiliary: Normal. Pancreas: Normal. Spleen: Normal. Adrenals/Urinary Tract: Normal. Stomach/Bowel: The patient has an incarcerated periumbilical hernia just below the umbilicus. There is a loop of small bowel which extends into the hernia sac and is dilated proximal to the sac and decompressed distal to the sac consistent with partial obstruction. There is edema in the soft tissues around the hernia and there is also a small amount of fluid in the hernia sac. There is also a focal hernia just above the umbilicus without incarceration. The patient has had a hemicolectomy. There are multiple diverticula in the remaining portion of the colon. Vascular/Lymphatic: Minimal calcification in the abdominal aorta. No adenopathy. Reproductive: Atrophic uterus and ovaries are normal. Other: No free air or free fluid. Musculoskeletal: No acute bone abnormality. Moderately severe right hip arthritis. Moderate left hip arthritis. Degenerative disc and facet joint disease in the lumbar spine. IMPRESSION: Incarcerated periumbilical hernia containing a loop of small bowel creating partial small bowel obstruction. Inflammation in and around the hernia sac. Critical Value/emergent results were called by telephone at the time of interpretation on 12/13/2015 at ~2:53pm to Dr. Ramonita Lab , who verbally acknowledged these results. Electronically Signed   By: Lorriane Shire M.D.   On: 12/13/2015 15:59  Anti-infectives: Anti-infectives    None      Assessment/Plan:  INR is over 4 at this point and Coumadin has been held. Patient cannot undergo a repair of this recurrent incarcerated ventral hernia at this point because of the  high risk of a hematoma around the mesh becoming infected requiring excision this is complicated by her other medical problems including obesity and diabetes. This recurrent hernia is now reducible and nontender. I would like to keep the patient off of the Coumadin and on subcutaneous heparin at this point until the INR approaches 1.8 or so and then we could operate and repair this hernia this will require several days in the hospital but I felt oh I'm afraid that if we try to discharge her off of her Coumadin and perform her surgery as an outpatient that she will incarcerate at home and require emergency surgery which would be very detrimental and risky. She understands this plan  Florene Glen, MD, FACS  12/14/2015

## 2015-12-14 NOTE — Progress Notes (Signed)
Notified of critical INR, no new orders

## 2015-12-14 NOTE — Progress Notes (Signed)
[place] at [facility]                                                                                                                                                                                            Patient Demographics   [name], is a 82 y.o. female, DOB - 1929/06/14, [id number]  Admit date - 12/13/2015   Admitting Physician Florene Glen, MD  Outpatient Primary MD for the patient is BERT Briscoe Burns III, MD   LOS - 1  Subjective:Patient continues to have abdominal pain. Denies any nausea or vomiting INR is elevated for surgery     Review of Systems:   CONSTITUTIONAL: No documented fever. No fatigue, weakness. No weight gain, no weight loss.  EYES: No blurry or double vision.  ENT: No tinnitus. No postnasal drip. No redness of the oropharynx.  RESPIRATORY: No cough, no wheeze, no hemoptysis. No dyspnea.  CARDIOVASCULAR: No chest pain. No orthopnea. No palpitations. No syncope.  GASTROINTESTINAL: No nausea, no vomiting or diarrhea. + abdominal pain. No melena or hematochezia.  GENITOURINARY: No dysuria or hematuria.  ENDOCRINE: No polyuria or nocturia. No heat or cold intolerance.  HEMATOLOGY: No anemia. No bruising. No bleeding.  INTEGUMENTARY: No rashes. No lesions.  MUSCULOSKELETAL: No arthritis. No swelling. No gout.  NEUROLOGIC: No numbness, tingling, or ataxia. No seizure-type activity.  PSYCHIATRIC: No anxiety. No insomnia. No ADD.    Vitals:   Filed Vitals:   12/13/15 1700 12/13/15 1804 12/13/15 2138 12/14/15 0411  BP: 182/72 153/45 139/49 167/88  Pulse: 70 71 71 108  Temp:  97.7 F (36.5 C) 97.6 F (36.4 C) 98.2 F (36.8 C)  TempSrc:  Oral Oral Oral  Resp: 20 20 16 20   Height:  5\' 3"  (1.6 m)    Weight:  114.08 kg (251 lb 8 oz)    SpO2: 99% 98% 98% 99%    Wt Readings from Last 3 Encounters:  12/13/15 114.08 kg (251 lb 8 oz)  07/10/15 122.2 kg (269 lb 6.4 oz)  06/20/15 126.1 kg (278 lb)      Intake/Output Summary (Last 24 hours) at 12/14/15 1243 Last data filed at 12/14/15 0900  Gross per 24 hour  Intake 1199.67 ml  Output      6 ml  Net 1193.67 ml    Physical Exam:   GENERAL: Pleasant-appearing in no apparent distress.  HEAD, EYES, EARS, NOSE AND THROAT: Atraumatic, normocephalic. Extraocular muscles are intact. Pupils equal and reactive to light. Sclerae anicteric. No conjunctival injection. No oro-pharyngeal erythema.  NECK: Supple. There is no jugular venous distention. No bruits, no  lymphadenopathy, no thyromegaly.  HEART: Regular rate and rhythm,. No murmurs, no rubs, no clicks.  LUNGS: Clear to auscultation bilaterally. No rales or rhonchi. No wheezes.  ABDOMEN: Soft, flat, + tenderness,abdominal hernia nondistended. Has good bowel sounds. No hepatosplenomegaly appreciated.  EXTREMITIES: No evidence of any cyanosis, clubbing, or peripheral edema.  +2 pedal and radial pulses bilaterally.  NEUROLOGIC: The patient is alert, awake, and oriented x3 with no focal motor or sensory deficits appreciated bilaterally.  SKIN: Moist and warm with no rashes appreciated.  Psych: Not anxious, depressed LN: No inguinal LN enlargement    Antibiotics   Anti-infectives    None      Medications   Scheduled Meds: . allopurinol  150 mg Oral Daily  . anastrozole  1 mg Oral Daily  . atenolol  25 mg Oral QPM  . insulin aspart  0-9 Units Subcutaneous TID WC  . latanoprost  1 drop Both Eyes QHS  . lisinopril  30 mg Oral BID  . pantoprazole  40 mg Oral Daily   Continuous Infusions: . lactated ringers 100 mL/hr at 12/14/15 0518   PRN Meds:.hydrALAZINE, morphine injection, ondansetron **OR** ondansetron (ZOFRAN) IV   Data Review:   Micro Results No results found for this or any previous visit (from the past 240 hour(s)).  Radiology Reports Ct Abdomen Pelvis W Contrast  12/13/2015  CLINICAL DATA:  Progressive lower abdominal pain. Chronic periumbilical hernias. EXAM:  CT ABDOMEN AND PELVIS WITH CONTRAST TECHNIQUE: Multidetector CT imaging of the abdomen and pelvis was performed using the standard protocol following bolus administration of intravenous contrast. CONTRAST:  159mL ISOVUE-300 IOPAMIDOL (ISOVUE-300) INJECTION 61% COMPARISON:  None. FINDINGS: Lower chest: No acute abnormalities. Slight coronary artery calcification. Slight chronic changes at the lung bases. Hepatobiliary: Normal. Pancreas: Normal. Spleen: Normal. Adrenals/Urinary Tract: Normal. Stomach/Bowel: The patient has an incarcerated periumbilical hernia just below the umbilicus. There is a loop of small bowel which extends into the hernia sac and is dilated proximal to the sac and decompressed distal to the sac consistent with partial obstruction. There is edema in the soft tissues around the hernia and there is also a small amount of fluid in the hernia sac. There is also a focal hernia just above the umbilicus without incarceration. The patient has had a hemicolectomy. There are multiple diverticula in the remaining portion of the colon. Vascular/Lymphatic: Minimal calcification in the abdominal aorta. No adenopathy. Reproductive: Atrophic uterus and ovaries are normal. Other: No free air or free fluid. Musculoskeletal: No acute bone abnormality. Moderately severe right hip arthritis. Moderate left hip arthritis. Degenerative disc and facet joint disease in the lumbar spine. IMPRESSION: Incarcerated periumbilical hernia containing a loop of small bowel creating partial small bowel obstruction. Inflammation in and around the hernia sac. Critical Value/emergent results were called by telephone at the time of interpretation on 12/13/2015 at ~2:53pm to Dr. Ramonita Lab , who verbally acknowledged these results. Electronically Signed   By: Lorriane Shire M.D.   On: 12/13/2015 15:59     CBC  Recent Labs Lab 12/13/15 1603 12/14/15 0529  WBC 12.6* 8.4  HGB 11.9* 11.9*  HCT 36.1 34.7*  PLT 262 249  MCV 92.9  91.0  MCH 30.5 31.3  MCHC 32.8 34.4  RDW 14.5 14.0  LYMPHSABS 2.6  --   MONOABS 0.7  --   EOSABS 0.1  --   BASOSABS 0.2*  --     Chemistries   Recent Labs Lab 12/13/15 1603 12/14/15 0529  NA 129* 133*  K 4.2 4.7  CL 94* 101  CO2 23 24  GLUCOSE 110* 124*  BUN 22* 18  CREATININE 1.26* 1.20*  CALCIUM 9.4 8.9  AST 33  --   ALT 26  --   ALKPHOS 68  --   BILITOT 0.3  --    ------------------------------------------------------------------------------------------------------------------ estimated creatinine clearance is 42.5 mL/min (by C-G formula based on Cr of 1.2). ------------------------------------------------------------------------------------------------------------------ No results for input(s): HGBA1C in the last 72 hours. ------------------------------------------------------------------------------------------------------------------ No results for input(s): CHOL, HDL, LDLCALC, TRIG, CHOLHDL, LDLDIRECT in the last 72 hours. ------------------------------------------------------------------------------------------------------------------ No results for input(s): TSH, T4TOTAL, T3FREE, THYROIDAB in the last 72 hours.  Invalid input(s): FREET3 ------------------------------------------------------------------------------------------------------------------ No results for input(s): VITAMINB12, FOLATE, FERRITIN, TIBC, IRON, RETICCTPCT in the last 72 hours.  Coagulation profile  Recent Labs Lab 12/13/15 1603 12/14/15 0529  INR 3.79 4.23*    No results for input(s): DDIMER in the last 72 hours.  Cardiac Enzymes No results for input(s): CKMB, TROPONINI, MYOGLOBIN in the last 168 hours.  Invalid input(s): CK ------------------------------------------------------------------------------------------------------------------ Invalid input(s): POCBNP    Assessment & Plan  Pt is 82 y.o Incarcerated hernia     * Hypertension Blood pressure is currently labile  continue lisinopril and  Atenolol add when necessary hydralazine   * Diabetes  SSI   Continue to monitor  * History of DVT  INR is elevated   Continue to hold comumadin  * Glaucoma  Continue eye drops.   * incarcerted hernia  Per surgery awaiting inr to drift down      Code Status Orders        Start     Ordered   12/13/15 1636  Full code   Continuous     12/13/15 1638    Code Status History    Date Active Date Inactive Code Status Order ID Comments User Context   06/04/2015  2:15 AM 06/06/2015  4:32 PM Full Code [id number]  Marlyce Huge, MD Inpatient    Advance Directive Documentation        Most Recent Value   Type of Advance Directive  Healthcare Power of Attorney, Living will   Pre-existing out of facility DNR order (yellow form or pink MOST form)     "MOST" Form in Place?              DVT Prophylaxis  irn elevated no ac  Lab Results  Component Value Date   PLT 249 12/14/2015     Time Spent in minutes   53min  Greater than 50% of time spent in care coordination and counseling patient regarding the condition and plan of care.   Dustin Flock M.D on 12/14/2015 at 12:43 PM  Between 7am to 6pm - Pager - [phone]  After 6pm go to [email or web address] - password [email or web address] [facility]   Office  [phone]

## 2015-12-15 LAB — GLUCOSE, CAPILLARY
GLUCOSE-CAPILLARY: 135 mg/dL — AB (ref 65–99)
GLUCOSE-CAPILLARY: 131 mg/dL — AB (ref 65–99)
Glucose-Capillary: 99 mg/dL (ref 65–99)
Glucose-Capillary: 139 mg/dL — ABNORMAL HIGH (ref 65–99)

## 2015-12-15 LAB — PROTIME-INR
INR: 4.07 — AB
PROTHROMBIN TIME: 38.5 s — AB (ref 11.4–15.0)

## 2015-12-15 LAB — MRSA PCR SCREENING: MRSA BY PCR: NEGATIVE

## 2015-12-15 NOTE — Progress Notes (Signed)
Md Notified of critical INR. And pt confusion during the night. Pt oriented to self only, husband stated that is not her baseline

## 2015-12-15 NOTE — Progress Notes (Signed)
[place] at [facility]                                                                                                                                                                                            Patient Demographics   [name], is a 82 y.o. female, DOB - [birth year], [id number]  Admit date - 12/13/2015   Admitting Physician Florene Glen, MD  Outpatient Primary MD for the patient is [place], MD   LOS - 2  Subjective: Feeling better states abdominal pain improved INR still continues to be elevated    Review of Systems:   CONSTITUTIONAL: No documented fever. No fatigue, weakness. No weight gain, no weight loss.  EYES: No blurry or double vision.  ENT: No tinnitus. No postnasal drip. No redness of the oropharynx.  RESPIRATORY: No cough, no wheeze, no hemoptysis. No dyspnea.  CARDIOVASCULAR: No chest pain. No orthopnea. No palpitations. No syncope.  GASTROINTESTINAL: No nausea, no vomiting or diarrhea. + abdominal pain. No melena or hematochezia.  GENITOURINARY: No dysuria or hematuria.  ENDOCRINE: No polyuria or nocturia. No heat or cold intolerance.  HEMATOLOGY: No anemia. No bruising. No bleeding.  INTEGUMENTARY: No rashes. No lesions.  MUSCULOSKELETAL: No arthritis. No swelling. No gout.  NEUROLOGIC: No numbness, tingling, or ataxia. No seizure-type activity.  PSYCHIATRIC: No anxiety. No insomnia. No ADD.    Vitals:   Filed Vitals:   12/14/15 2115 12/14/15 2238 12/15/15 0430 12/15/15 0853  BP: 151/76 153/50 158/51 148/62  Pulse: 86 77 74 78  Temp: 97.8 F (36.6 C)  97.8 F (36.6 C)   TempSrc: Oral  Oral   Resp: 24  16   Height:      Weight:      SpO2: 97%  97%     Wt Readings from Last 3 Encounters:  12/13/15 114.08 kg (251 lb 8 oz)  07/10/15 122.2 kg (269 lb 6.4 oz)  06/20/15 126.1 kg (278 lb)     Intake/Output Summary (Last 24 hours) at 12/15/15 1038 Last data filed at 12/15/15 0803  Gross  per 24 hour  Intake    480 ml  Output      8 ml  Net    472 ml    Physical Exam:   GENERAL: Pleasant-appearing in no apparent distress.  HEAD, EYES, EARS, NOSE AND THROAT: Atraumatic, normocephalic. Extraocular muscles are intact. Pupils equal and reactive to light. Sclerae anicteric. No conjunctival injection. No oro-pharyngeal erythema.  NECK: Supple. There is no jugular venous distention. No bruits, no lymphadenopathy, no thyromegaly.  HEART: Regular rate and rhythm,.  No murmurs, no rubs, no clicks.  LUNGS: Clear to auscultation bilaterally. No rales or rhonchi. No wheezes.  ABDOMEN: Soft, flat, + tenderness,abdominal hernia nondistended. Has good bowel sounds. No hepatosplenomegaly appreciated.  EXTREMITIES: No evidence of any cyanosis, clubbing, or peripheral edema.  +2 pedal and radial pulses bilaterally.  NEUROLOGIC: The patient is alert, awake, and oriented x3 with no focal motor or sensory deficits appreciated bilaterally.  SKIN: Moist and warm with no rashes appreciated.  Psych: Not anxious, depressed LN: No inguinal LN enlargement    Antibiotics   Anti-infectives    None      Medications   Scheduled Meds: . allopurinol  150 mg Oral Daily  . anastrozole  1 mg Oral Daily  . atenolol  25 mg Oral QPM  . insulin aspart  0-9 Units Subcutaneous TID WC  . latanoprost  1 drop Both Eyes QHS  . lisinopril  30 mg Oral BID  . pantoprazole  40 mg Oral Daily   Continuous Infusions:   PRN Meds:.hydrALAZINE, morphine injection, ondansetron **OR** ondansetron (ZOFRAN) IV   Data Review:   Micro Results No results found for this or any previous visit (from the past 240 hour(s)).  Radiology Reports Ct Abdomen Pelvis W Contrast  12/13/2015  CLINICAL DATA:  Progressive lower abdominal pain. Chronic periumbilical hernias. EXAM: CT ABDOMEN AND PELVIS WITH CONTRAST TECHNIQUE: Multidetector CT imaging of the abdomen and pelvis was performed using the standard protocol following  bolus administration of intravenous contrast. CONTRAST:  148mL ISOVUE-300 IOPAMIDOL (ISOVUE-300) INJECTION 61% COMPARISON:  None. FINDINGS: Lower chest: No acute abnormalities. Slight coronary artery calcification. Slight chronic changes at the lung bases. Hepatobiliary: Normal. Pancreas: Normal. Spleen: Normal. Adrenals/Urinary Tract: Normal. Stomach/Bowel: The patient has an incarcerated periumbilical hernia just below the umbilicus. There is a loop of small bowel which extends into the hernia sac and is dilated proximal to the sac and decompressed distal to the sac consistent with partial obstruction. There is edema in the soft tissues around the hernia and there is also a small amount of fluid in the hernia sac. There is also a focal hernia just above the umbilicus without incarceration. The patient has had a hemicolectomy. There are multiple diverticula in the remaining portion of the colon. Vascular/Lymphatic: Minimal calcification in the abdominal aorta. No adenopathy. Reproductive: Atrophic uterus and ovaries are normal. Other: No free air or free fluid. Musculoskeletal: No acute bone abnormality. Moderately severe right hip arthritis. Moderate left hip arthritis. Degenerative disc and facet joint disease in the lumbar spine. IMPRESSION: Incarcerated periumbilical hernia containing a loop of small bowel creating partial small bowel obstruction. Inflammation in and around the hernia sac. Critical Value/emergent results were called by telephone at the time of interpretation on 12/13/2015 at ~2:53pm to Dr. Ramonita Lab , who verbally acknowledged these results. Electronically Signed   By: Lorriane Shire M.D.   On: 12/13/2015 15:59     CBC  Recent Labs Lab 12/13/15 1603 12/14/15 0529  WBC 12.6* 8.4  HGB 11.9* 11.9*  HCT 36.1 34.7*  PLT 262 249  MCV 92.9 91.0  MCH 30.5 31.3  MCHC 32.8 34.4  RDW 14.5 14.0  LYMPHSABS 2.6  --   MONOABS 0.7  --   EOSABS 0.1  --   BASOSABS 0.2*  --     Chemistries    Recent Labs Lab 12/13/15 1603 12/14/15 0529  NA 129* 133*  K 4.2 4.7  CL 94* 101  CO2 23 24  GLUCOSE 110* 124*  BUN  22* 18  CREATININE 1.26* 1.20*  CALCIUM 9.4 8.9  AST 33  --   ALT 26  --   ALKPHOS 68  --   BILITOT 0.3  --    ------------------------------------------------------------------------------------------------------------------ estimated creatinine clearance is 42.5 mL/min (by C-G formula based on Cr of 1.2). ------------------------------------------------------------------------------------------------------------------ No results for input(s): HGBA1C in the last 72 hours. ------------------------------------------------------------------------------------------------------------------ No results for input(s): CHOL, HDL, LDLCALC, TRIG, CHOLHDL, LDLDIRECT in the last 72 hours. ------------------------------------------------------------------------------------------------------------------ No results for input(s): TSH, T4TOTAL, T3FREE, THYROIDAB in the last 72 hours.  Invalid input(s): FREET3 ------------------------------------------------------------------------------------------------------------------ No results for input(s): VITAMINB12, FOLATE, FERRITIN, TIBC, IRON, RETICCTPCT in the last 72 hours.  Coagulation profile  Recent Labs Lab 12/13/15 1603 12/14/15 0529 12/15/15 0509  INR 3.79 4.23* 4.07*    No results for input(s): DDIMER in the last 72 hours.  Cardiac Enzymes No results for input(s): CKMB, TROPONINI, MYOGLOBIN in the last 168 hours.  Invalid input(s): CK ------------------------------------------------------------------------------------------------------------------ Invalid input(s): POCBNP    Assessment & Plan  Pt is 82 y.o Incarcerated hernia     * Hypertension Blood pressure acceptable continue current therapy with lisinopril and atenolol Continue when necessary hydralazine   * Diabetes  SSI   Continue to  monitor  * History of DVT  INR is elevated   Continue to hold comumadin   Follow INR in the morning  * Glaucoma  Continue eye drops.   * incarcerted hernia  Per surgery awaiting inr to drift down      Code Status Orders        Start     Ordered   12/13/15 1636  Full code   Continuous     12/13/15 1638    Code Status History    Date Active Date Inactive Code Status Order ID Comments User Context   06/04/2015  2:15 AM 06/06/2015  4:32 PM Full Code [id number]  Marlyce Huge, MD Inpatient    Advance Directive Documentation        Most Recent Value   Type of Advance Directive  Healthcare Power of Attorney, Living will   Pre-existing out of facility DNR order (yellow form or pink MOST form)     "MOST" Form in Place?              DVT Prophylaxis  irn elevated no ac  Lab Results  Component Value Date   PLT 249 12/14/2015     Time Spent in minutes 16min  Greater than 50% of time spent in care coordination and counseling patient regarding the condition and plan of care.   Dustin Flock M.D on 12/15/2015 at 10:38 AM  Between 7am to 6pm - Pager - [phone]  After 6pm go to [email or web address] - password [email or web address] [facility]   Office  [phone]

## 2015-12-15 NOTE — Plan of Care (Signed)
Problem: Education: Goal: Knowledge of [facility] Education information/materials will improve Outcome: Progressing Pt more alert than at beginning of shift; progression shown

## 2015-12-15 NOTE — Progress Notes (Signed)
CC: Recurrent incarcerated ventral hernia Subjective: Patient feels better today she has no abdominal pain. In fact she comments on the quality of the room and states she feels "like a queen". No nausea vomiting no abdominal pain no fevers or chills  Objective: Vital signs in last 24 hours: Temp:  [97.8 F (36.6 C)-98.1 F (36.7 C)] 97.8 F (36.6 C) (06/04 0430) Pulse Rate:  [72-86] 74 (06/04 0430) Resp:  [16-24] 16 (06/04 0430) BP: (149-158)/(50-76) 158/51 mmHg (06/04 0430) SpO2:  [97 %-98 %] 97 % (06/04 0430) Last BM Date: 12/14/15  Intake/Output from previous day: 06/03 0701 - 06/04 0700 In: 480 [P.O.:480] Out: 10 [Urine:7; Stool:3] Intake/Output this shift:    Physical exam:  Morbidly obese comfortable-appearing female patient vital signs are stable she is afebrile abdomen is soft both hernias are reducible and nontender calves are nontender edema present no icterus no jaundice  Lab Results: CBC   Recent Labs  12/13/15 1603 12/14/15 0529  WBC 12.6* 8.4  HGB 11.9* 11.9*  HCT 36.1 34.7*  PLT 262 249   BMET  Recent Labs  12/13/15 1603 12/14/15 0529  NA 129* 133*  K 4.2 4.7  CL 94* 101  CO2 23 24  GLUCOSE 110* 124*  BUN 22* 18  CREATININE 1.26* 1.20*  CALCIUM 9.4 8.9   PT/INR  Recent Labs  12/14/15 0529 12/15/15 0509  LABPROT 39.6* 38.5*  INR 4.23* 4.07*   ABG No results for input(s): PHART, HCO3 in the last 72 hours.  Invalid input(s): PCO2, PO2  Studies/Results: Ct Abdomen Pelvis W Contrast  12/13/2015  CLINICAL DATA:  Progressive lower abdominal pain. Chronic periumbilical hernias. EXAM: CT ABDOMEN AND PELVIS WITH CONTRAST TECHNIQUE: Multidetector CT imaging of the abdomen and pelvis was performed using the standard protocol following bolus administration of intravenous contrast. CONTRAST:  134mL ISOVUE-300 IOPAMIDOL (ISOVUE-300) INJECTION 61% COMPARISON:  None. FINDINGS: Lower chest: No acute abnormalities. Slight coronary artery calcification.  Slight chronic changes at the lung bases. Hepatobiliary: Normal. Pancreas: Normal. Spleen: Normal. Adrenals/Urinary Tract: Normal. Stomach/Bowel: The patient has an incarcerated periumbilical hernia just below the umbilicus. There is a loop of small bowel which extends into the hernia sac and is dilated proximal to the sac and decompressed distal to the sac consistent with partial obstruction. There is edema in the soft tissues around the hernia and there is also a small amount of fluid in the hernia sac. There is also a focal hernia just above the umbilicus without incarceration. The patient has had a hemicolectomy. There are multiple diverticula in the remaining portion of the colon. Vascular/Lymphatic: Minimal calcification in the abdominal aorta. No adenopathy. Reproductive: Atrophic uterus and ovaries are normal. Other: No free air or free fluid. Musculoskeletal: No acute bone abnormality. Moderately severe right hip arthritis. Moderate left hip arthritis. Degenerative disc and facet joint disease in the lumbar spine. IMPRESSION: Incarcerated periumbilical hernia containing a loop of small bowel creating partial small bowel obstruction. Inflammation in and around the hernia sac. Critical Value/emergent results were called by telephone at the time of interpretation on 12/13/2015 at ~2:53pm to Dr. Ramonita Lab , who verbally acknowledged these results. Electronically Signed   By: Lorriane Shire M.D.   On: 12/13/2015 15:59    Anti-infectives: Anti-infectives    None      Assessment/Plan:  This patient with a recurrent incarcerated ventral hernia which currently is very reducible and nontender. Her PT INR is still elevated in fact her INR is over 4. We cannot consider surgical  intervention at this point without excepting a tremendous risk of an infected hematoma around mesh. She has failed non-mesh therapy in the past due to the need for an emergency repair of an incarcerated hernia by Dr. Rexene Edison last  year. Mesh is certainly indicated but cannot be performed at this time. She will need to stay in the hospital until her INR normalizes and then surgery can be performed most likely in 2 or 3 days. I am concerned that if she goes home prior to surgery and returns for outpatient surgery that she will incarcerate and methods such as a abdominal binder in this morbidly obese patient will not likely a cyst with that. For that reason I'm recommending that she stay in the hospital.  Florene Glen, MD, FACS  12/15/2015

## 2015-12-16 LAB — GLUCOSE, CAPILLARY
Glucose-Capillary: 121 mg/dL — ABNORMAL HIGH (ref 65–99)
Glucose-Capillary: 153 mg/dL — ABNORMAL HIGH (ref 65–99)
Glucose-Capillary: 177 mg/dL — ABNORMAL HIGH (ref 65–99)
Glucose-Capillary: 99 mg/dL (ref 65–99)

## 2015-12-16 LAB — PROTIME-INR
INR: 2.99
PROTHROMBIN TIME: 30.5 s — AB (ref 11.4–15.0)

## 2015-12-16 NOTE — Progress Notes (Signed)
[place] [facility]                                                                                                                                                                                            Patient Demographics   [name], is a 82 y.o. female, DOB - 06/03/1929, [id number]  Admit date - 12/13/2015   Admitting Physician Florene Glen, MD  Outpatient Primary MD for the patient is Mutual III, MD   LOS - 3  Subjective: Abdominal pain resolved INR drifting down  Review of Systems:   CONSTITUTIONAL: No documented fever. No fatigue, weakness. No weight gain, no weight loss.  EYES: No blurry or double vision.  ENT: No tinnitus. No postnasal drip. No redness of the oropharynx.  RESPIRATORY: No cough, no wheeze, no hemoptysis. No dyspnea.  CARDIOVASCULAR: No chest pain. No orthopnea. No palpitations. No syncope.  GASTROINTESTINAL: No nausea, no vomiting or diarrhea. + abdominal pain. No melena or hematochezia.  GENITOURINARY: No dysuria or hematuria.  ENDOCRINE: No polyuria or nocturia. No heat or cold intolerance.  HEMATOLOGY: No anemia. No bruising. No bleeding.  INTEGUMENTARY: No rashes. No lesions.  MUSCULOSKELETAL: No arthritis. No swelling. No gout.  NEUROLOGIC: No numbness, tingling, or ataxia. No seizure-type activity.  PSYCHIATRIC: No anxiety. No insomnia. No ADD.    Vitals:   Filed Vitals:   12/15/15 2103 12/16/15 0523 12/16/15 1237 12/16/15 1238  BP: 167/41 154/43 186/46 171/50  Pulse: 76 73 74 72  Temp: 98.3 F (36.8 C) 97.9 F (36.6 C) 98.3 F (36.8 C)   TempSrc: Oral Oral Oral   Resp: 20 20 18    Height:      Weight:      SpO2: 97% 97% 98%     Wt Readings from Last 3 Encounters:  12/13/15 114.08 kg (251 lb 8 oz)  07/10/15 122.2 kg (269 lb 6.4 oz)  06/20/15 126.1 kg (278 lb)     Intake/Output Summary (Last 24 hours) at 12/16/15 1259 Last data filed at 12/16/15 0900  Gross per 24 hour   Intake    240 ml  Output      0 ml  Net    240 ml    Physical Exam:   GENERAL: Pleasant-appearing in no apparent distress.  HEAD, EYES, EARS, NOSE AND THROAT: Atraumatic, normocephalic. Extraocular muscles are intact. Pupils equal and reactive to light. Sclerae anicteric. No conjunctival injection. No oro-pharyngeal erythema.  NECK: Supple. There is no jugular venous distention. No bruits, no lymphadenopathy, no thyromegaly.  HEART: Regular rate and rhythm,. No murmurs, no rubs, no  clicks.  LUNGS: Clear to auscultation bilaterally. No rales or rhonchi. No wheezes.  ABDOMEN: Soft, flat, + tenderness,abdominal hernia nondistended. Has good bowel sounds. No hepatosplenomegaly appreciated.  EXTREMITIES: No evidence of any cyanosis, clubbing, or peripheral edema.  +2 pedal and radial pulses bilaterally.  NEUROLOGIC: The patient is alert, awake, and oriented x3 with no focal motor or sensory deficits appreciated bilaterally.  SKIN: Moist and warm with no rashes appreciated.  Psych: Not anxious, depressed LN: No inguinal LN enlargement    Antibiotics   Anti-infectives    None      Medications   Scheduled Meds: . allopurinol  150 mg Oral Daily  . anastrozole  1 mg Oral Daily  . atenolol  25 mg Oral QPM  . insulin aspart  0-9 Units Subcutaneous TID WC  . latanoprost  1 drop Both Eyes QHS  . lisinopril  30 mg Oral BID  . pantoprazole  40 mg Oral Daily   Continuous Infusions:   PRN Meds:.hydrALAZINE, morphine injection, ondansetron **OR** ondansetron (ZOFRAN) IV   Data Review:   Micro Results Recent Results (from the past 240 hour(s))  MRSA PCR Screening     Status: None   Collection Time: 12/15/15  9:50 AM  Result Value Ref Range Status   MRSA by PCR NEGATIVE NEGATIVE Final    Comment:        The GeneXpert MRSA Assay (FDA approved for NASAL specimens only), is one component of a comprehensive MRSA colonization surveillance program. It is not intended to diagnose  MRSA infection nor to guide or monitor treatment for MRSA infections.     Radiology Reports Ct Abdomen Pelvis W Contrast  12/13/2015  CLINICAL DATA:  Progressive lower abdominal pain. Chronic periumbilical hernias. EXAM: CT ABDOMEN AND PELVIS WITH CONTRAST TECHNIQUE: Multidetector CT imaging of the abdomen and pelvis was performed using the standard protocol following bolus administration of intravenous contrast. CONTRAST:  149mL ISOVUE-300 IOPAMIDOL (ISOVUE-300) INJECTION 61% COMPARISON:  None. FINDINGS: Lower chest: No acute abnormalities. Slight coronary artery calcification. Slight chronic changes at the lung bases. Hepatobiliary: Normal. Pancreas: Normal. Spleen: Normal. Adrenals/Urinary Tract: Normal. Stomach/Bowel: The patient has an incarcerated periumbilical hernia just below the umbilicus. There is a loop of small bowel which extends into the hernia sac and is dilated proximal to the sac and decompressed distal to the sac consistent with partial obstruction. There is edema in the soft tissues around the hernia and there is also a small amount of fluid in the hernia sac. There is also a focal hernia just above the umbilicus without incarceration. The patient has had a hemicolectomy. There are multiple diverticula in the remaining portion of the colon. Vascular/Lymphatic: Minimal calcification in the abdominal aorta. No adenopathy. Reproductive: Atrophic uterus and ovaries are normal. Other: No free air or free fluid. Musculoskeletal: No acute bone abnormality. Moderately severe right hip arthritis. Moderate left hip arthritis. Degenerative disc and facet joint disease in the lumbar spine. IMPRESSION: Incarcerated periumbilical hernia containing a loop of small bowel creating partial small bowel obstruction. Inflammation in and around the hernia sac. Critical Value/emergent results were called by telephone at the time of interpretation on 12/13/2015 at ~2:53pm to Dr. Ramonita Lab , who verbally  acknowledged these results. Electronically Signed   By: Lorriane Shire M.D.   On: 12/13/2015 15:59     CBC  Recent Labs Lab 12/13/15 1603 12/14/15 0529  WBC 12.6* 8.4  HGB 11.9* 11.9*  HCT 36.1 34.7*  PLT 262 249  MCV 92.9 91.0  MCH 30.5 31.3  MCHC 32.8 34.4  RDW 14.5 14.0  LYMPHSABS 2.6  --   MONOABS 0.7  --   EOSABS 0.1  --   BASOSABS 0.2*  --     Chemistries   Recent Labs Lab 12/13/15 1603 12/14/15 0529  NA 129* 133*  K 4.2 4.7  CL 94* 101  CO2 23 24  GLUCOSE 110* 124*  BUN 22* 18  CREATININE 1.26* 1.20*  CALCIUM 9.4 8.9  AST 33  --   ALT 26  --   ALKPHOS 68  --   BILITOT 0.3  --    ------------------------------------------------------------------------------------------------------------------ estimated creatinine clearance is 42.5 mL/min (by C-G formula based on Cr of 1.2). ------------------------------------------------------------------------------------------------------------------ No results for input(s): HGBA1C in the last 72 hours. ------------------------------------------------------------------------------------------------------------------ No results for input(s): CHOL, HDL, LDLCALC, TRIG, CHOLHDL, LDLDIRECT in the last 72 hours. ------------------------------------------------------------------------------------------------------------------ No results for input(s): TSH, T4TOTAL, T3FREE, THYROIDAB in the last 72 hours.  Invalid input(s): FREET3 ------------------------------------------------------------------------------------------------------------------ No results for input(s): VITAMINB12, FOLATE, FERRITIN, TIBC, IRON, RETICCTPCT in the last 72 hours.  Coagulation profile  Recent Labs Lab 12/13/15 1603 12/14/15 0529 12/15/15 0509 12/16/15 0654  INR 3.79 4.23* 4.07* 2.99    No results for input(s): DDIMER in the last 72 hours.  Cardiac Enzymes No results for input(s): CKMB, TROPONINI, MYOGLOBIN in the last 168  hours.  Invalid input(s): CK ------------------------------------------------------------------------------------------------------------------ Invalid input(s): POCBNP    Assessment & Plan  Pt is 82 y.o Incarcerated hernia     * Hypertension -Elevated I'll go ahead and increase her atenolol to 50   * Diabetes labile  SSI   Continue to monitor  * History of DVT  INR drifting down today 2.9  * Glaucoma  Continue eye drops.   * incarcerted hernia  Likely surgery Wednesday    Code Status Orders        Start     Ordered   12/13/15 1636  Full code   Continuous     12/13/15 1638    Code Status History    Date Active Date Inactive Code Status Order ID Comments User Context   06/04/2015  2:15 AM 06/06/2015  4:32 PM Full Code [id number]  Marlyce Huge, MD Inpatient    Advance Directive Documentation        Most Recent Value   Type of Advance Directive  Healthcare Power of Attorney, Living will   Pre-existing out of facility DNR order (yellow form or pink MOST form)     "MOST" Form in Place?              DVT Prophylaxis  irn elevated no ac  Lab Results  Component Value Date   PLT 249 12/14/2015     Time Spent in minutes 56min  Greater than 50% of time spent in care coordination and counseling patient regarding the condition and plan of care.   Dustin Flock M.D on 12/16/2015 at 12:59 PM  Between 7am to 6pm - Pager - [phone]  After 6pm go to [email or web address] - password [email or web address] [facility]   Office  [phone]

## 2015-12-16 NOTE — Progress Notes (Signed)
Patient ID: [name], female   DOB: 1928/12/14, 82 y.o.   MRN: [id number] Pt with no abdominal complaints.  She does note slight pinkish discoloration of toes, feels numb. She has warm feet, good pulses. Discoloration likely not vascular. Abdomen- hernias in mid epigastric and near umbilicus, partially reducible. No tenderness. PT INR 2.9 today. Plan- surgery on Wed as per Dr. Burt Knack

## 2015-12-17 LAB — GLUCOSE, CAPILLARY
GLUCOSE-CAPILLARY: 175 mg/dL — AB (ref 65–99)
GLUCOSE-CAPILLARY: 173 mg/dL — AB (ref 65–99)
Glucose-Capillary: 128 mg/dL — ABNORMAL HIGH (ref 65–99)
Glucose-Capillary: 121 mg/dL — ABNORMAL HIGH (ref 65–99)

## 2015-12-17 LAB — PROTIME-INR
INR: 2.12
Prothrombin Time: 23.6 seconds — ABNORMAL HIGH (ref 11.4–15.0)

## 2015-12-17 MED ORDER — PHYTONADIONE 5 MG PO TABS
10.0000 mg | ORAL_TABLET | Freq: Once | ORAL | Status: AC
  Administered 2015-12-17: 10 mg via ORAL
  Filled 2015-12-17: qty 2

## 2015-12-17 MED ORDER — ATENOLOL 25 MG PO TABS
50.0000 mg | ORAL_TABLET | Freq: Every evening | ORAL | Status: DC
  Administered 2015-12-17 – 2015-12-19 (×3): 50 mg via ORAL
  Filled 2015-12-17 (×3): qty 2

## 2015-12-17 MED ORDER — SODIUM CHLORIDE 0.9 % IV SOLN
INTRAVENOUS | Status: DC
  Administered 2015-12-17: 23:00:00 via INTRAVENOUS
  Administered 2015-12-18: 1000 mL via INTRAVENOUS
  Administered 2015-12-19: via INTRAVENOUS

## 2015-12-17 NOTE — Clinical Social Work Note (Signed)
Clinical Social Work Assessment  Patient Details  Name: [name] MRN: [id number] Date of Birth: Mar 10, 1929  Date of referral:  12/17/15               Reason for consult:  Discharge Planning                Permission sought to share information with:    Permission granted to share information::     Name::        Agency::     Relationship::     Contact Information:     Housing/Transportation Living arrangements for the past 2 months:  Single Family Home Source of Information:  Adult Children Patient Interpreter Needed:  None Criminal Activity/Legal Involvement Pertinent to Current Situation/Hospitalization:  No - Comment as needed Significant Relationships:  Adult Children, Spouse Lives with:  Spouse Do you feel safe going back to the place where you live?    Need for family participation in patient care:     Care giving concerns:  Patient resides with spouse and son and daughter in law assist.   Social Worker assessment / plan:  CSW received consult for home needs and support needs for patient's husband and son. Patient's daughter in law: [name] stated that they are going to need to see how patient does after surgery. She stated that patient has personal care services for 4 hours a day, 3 times per week through [facility]. CSW will continue to follow and assess for needs.  Employment status:  Retired Forensic scientist:  Medicare PT Recommendations:  Not assessed at this time Information / Referral to community resources:     Patient/Family's Response to care:  Patient's daughter in law expressed appreciation for CSW assistance.  Patient/Family's Understanding of and Emotional Response to Diagnosis, Current Treatment, and Prognosis:  Patient scheduled for surgery and family is aware that patient's needs may change post surgery.  Emotional Assessment Appearance:  Appears stated age Attitude/Demeanor/Rapport:  Unable to Assess Affect (typically observed):   Unable to Assess Orientation:  Oriented to Self, Oriented to Place Alcohol / Substance use:  Not Applicable Psych involvement (Current and /or in the community):  No (Comment)  Discharge Needs  Concerns to be addressed:  Care Coordination Readmission within the last 30 days:  No Current discharge risk:  None Barriers to Discharge:  No Barriers Identified   Norma Leff, LCSW 12/17/2015, 3:57 PM

## 2015-12-17 NOTE — Progress Notes (Signed)
AVSS. Comfortable. Tentatively on schedule for recurrent ventral hernia repair tomorrow if INR is down. ABD: Soft. Lungs: Clear.  Last INR still> 2. Single dose of Vit K po ordered for today. NPO after MN, IV fluids at HS.  Consent per Dr. Burt Knack in AM.

## 2015-12-17 NOTE — Progress Notes (Signed)
[place] at [facility]                                                                                                                                                                                            Patient Demographics   [name], is a 82 y.o. female, DOB - 1928/09/10, [id number]  Admit date - 12/13/2015   Admitting Physician Florene Glen, MD  Outpatient Primary MD for the patient is Wallace, MD   LOS - 4  Subjective: Patient is feeling okay her INR is trending down abdominal pain resolved  Review of Systems:   CONSTITUTIONAL: No documented fever. No fatigue, weakness. No weight gain, no weight loss.  EYES: No blurry or double vision.  ENT: No tinnitus. No postnasal drip. No redness of the oropharynx.  RESPIRATORY: No cough, no wheeze, no hemoptysis. No dyspnea.  CARDIOVASCULAR: No chest pain. No orthopnea. No palpitations. No syncope.  GASTROINTESTINAL: No nausea, no vomiting or diarrhea. Resolved abdominal pain. No melena or hematochezia.  GENITOURINARY: No dysuria or hematuria.  ENDOCRINE: No polyuria or nocturia. No heat or cold intolerance.  HEMATOLOGY: No anemia. No bruising. No bleeding.  INTEGUMENTARY: No rashes. No lesions.  MUSCULOSKELETAL: No arthritis. No swelling. No gout.  NEUROLOGIC: No numbness, tingling, or ataxia. No seizure-type activity.  PSYCHIATRIC: No anxiety. No insomnia. No ADD.    Vitals:   Filed Vitals:   12/16/15 2101 12/16/15 2118 12/16/15 2129 12/17/15 0512  BP: 177/49 110/48 110/48 157/50  Pulse: 65   59  Temp: 97.9 F (36.6 C)   97.8 F (36.6 C)  TempSrc: Oral   Oral  Resp: 24     Height:      Weight:      SpO2: 99%   98%    Wt Readings from Last 3 Encounters:  12/13/15 114.08 kg (251 lb 8 oz)  07/10/15 122.2 kg (269 lb 6.4 oz)  06/20/15 126.1 kg (278 lb)     Intake/Output Summary (Last 24 hours) at 12/17/15 1147 Last data filed at 12/17/15 0900  Gross per 24  hour  Intake    580 ml  Output      0 ml  Net    580 ml    Physical Exam:   GENERAL: Pleasant-appearing in no apparent distress.  HEAD, EYES, EARS, NOSE AND THROAT: Atraumatic, normocephalic. Extraocular muscles are intact. Pupils equal and reactive to light. Sclerae anicteric. No conjunctival injection. No oro-pharyngeal erythema.  NECK: Supple. There is no jugular venous distention. No bruits, no lymphadenopathy, no thyromegaly.  HEART: Regular rate and rhythm,. No murmurs,  no rubs, no clicks.  LUNGS: Clear to auscultation bilaterally. No rales or rhonchi. No wheezes.  ABDOMEN: Soft, flat, non- tenderness,abdominal hernia nondistended. Has good bowel sounds. No hepatosplenomegaly appreciated.  EXTREMITIES: No evidence of any cyanosis, clubbing, or peripheral edema.  +2 pedal and radial pulses bilaterally.  NEUROLOGIC: The patient is alert, awake, and oriented x3 with no focal motor or sensory deficits appreciated bilaterally.  SKIN: Moist and warm with no rashes appreciated.  Psych: Not anxious, depressed LN: No inguinal LN enlargement    Antibiotics   Anti-infectives    None      Medications   Scheduled Meds: . allopurinol  150 mg Oral Daily  . anastrozole  1 mg Oral Daily  . atenolol  25 mg Oral QPM  . insulin aspart  0-9 Units Subcutaneous TID WC  . latanoprost  1 drop Both Eyes QHS  . lisinopril  30 mg Oral BID  . pantoprazole  40 mg Oral Daily   Continuous Infusions:   PRN Meds:.hydrALAZINE, morphine injection, ondansetron **OR** ondansetron (ZOFRAN) IV   Data Review:   Micro Results Recent Results (from the past 240 hour(s))  MRSA PCR Screening     Status: None   Collection Time: 12/15/15  9:50 AM  Result Value Ref Range Status   MRSA by PCR NEGATIVE NEGATIVE Final    Comment:        The GeneXpert MRSA Assay (FDA approved for NASAL specimens only), is one component of a comprehensive MRSA colonization surveillance program. It is not intended to  diagnose MRSA infection nor to guide or monitor treatment for MRSA infections.     Radiology Reports Ct Abdomen Pelvis W Contrast  12/13/2015  CLINICAL DATA:  Progressive lower abdominal pain. Chronic periumbilical hernias. EXAM: CT ABDOMEN AND PELVIS WITH CONTRAST TECHNIQUE: Multidetector CT imaging of the abdomen and pelvis was performed using the standard protocol following bolus administration of intravenous contrast. CONTRAST:  159mL ISOVUE-300 IOPAMIDOL (ISOVUE-300) INJECTION 61% COMPARISON:  None. FINDINGS: Lower chest: No acute abnormalities. Slight coronary artery calcification. Slight chronic changes at the lung bases. Hepatobiliary: Normal. Pancreas: Normal. Spleen: Normal. Adrenals/Urinary Tract: Normal. Stomach/Bowel: The patient has an incarcerated periumbilical hernia just below the umbilicus. There is a loop of small bowel which extends into the hernia sac and is dilated proximal to the sac and decompressed distal to the sac consistent with partial obstruction. There is edema in the soft tissues around the hernia and there is also a small amount of fluid in the hernia sac. There is also a focal hernia just above the umbilicus without incarceration. The patient has had a hemicolectomy. There are multiple diverticula in the remaining portion of the colon. Vascular/Lymphatic: Minimal calcification in the abdominal aorta. No adenopathy. Reproductive: Atrophic uterus and ovaries are normal. Other: No free air or free fluid. Musculoskeletal: No acute bone abnormality. Moderately severe right hip arthritis. Moderate left hip arthritis. Degenerative disc and facet joint disease in the lumbar spine. IMPRESSION: Incarcerated periumbilical hernia containing a loop of small bowel creating partial small bowel obstruction. Inflammation in and around the hernia sac. Critical Value/emergent results were called by telephone at the time of interpretation on 12/13/2015 at ~2:53pm to Dr. Ramonita Lab , who verbally  acknowledged these results. Electronically Signed   By: Lorriane Shire M.D.   On: 12/13/2015 15:59     CBC  Recent Labs Lab 12/13/15 1603 12/14/15 0529  WBC 12.6* 8.4  HGB 11.9* 11.9*  HCT 36.1 34.7*  PLT 262 249  MCV 92.9 91.0  MCH 30.5 31.3  MCHC 32.8 34.4  RDW 14.5 14.0  LYMPHSABS 2.6  --   MONOABS 0.7  --   EOSABS 0.1  --   BASOSABS 0.2*  --     Chemistries   Recent Labs Lab 12/13/15 1603 12/14/15 0529  NA 129* 133*  K 4.2 4.7  CL 94* 101  CO2 23 24  GLUCOSE 110* 124*  BUN 22* 18  CREATININE 1.26* 1.20*  CALCIUM 9.4 8.9  AST 33  --   ALT 26  --   ALKPHOS 68  --   BILITOT 0.3  --    ------------------------------------------------------------------------------------------------------------------ estimated creatinine clearance is 42.5 mL/min (by C-G formula based on Cr of 1.2). ------------------------------------------------------------------------------------------------------------------ No results for input(s): HGBA1C in the last 72 hours. ------------------------------------------------------------------------------------------------------------------ No results for input(s): CHOL, HDL, LDLCALC, TRIG, CHOLHDL, LDLDIRECT in the last 72 hours. ------------------------------------------------------------------------------------------------------------------ No results for input(s): TSH, T4TOTAL, T3FREE, THYROIDAB in the last 72 hours.  Invalid input(s): FREET3 ------------------------------------------------------------------------------------------------------------------ No results for input(s): VITAMINB12, FOLATE, FERRITIN, TIBC, IRON, RETICCTPCT in the last 72 hours.  Coagulation profile  Recent Labs Lab 12/13/15 1603 12/14/15 0529 12/15/15 0509 12/16/15 0654 12/17/15 0415  INR 3.79 4.23* 4.07* 2.99 2.12    No results for input(s): DDIMER in the last 72 hours.  Cardiac Enzymes No results for input(s): CKMB, TROPONINI, MYOGLOBIN in the last  168 hours.  Invalid input(s): CK ------------------------------------------------------------------------------------------------------------------ Invalid input(s): POCBNP    Assessment & Plan  Pt is 82 y.o Incarcerated hernia     * Hypertension -Continue current regimen with lisinopril and when necessary hydralazine increase atenolol   * Diabetes labile  SSI   Continue to monitor  * History of DVT  INR drifting much lower  * Glaucoma  Continue eye drops.   * incarcerted hernia  Likely surgery tomorrow    Code Status Orders        Start     Ordered   12/13/15 1636  Full code   Continuous     12/13/15 1638    Code Status History    Date Active Date Inactive Code Status Order ID Comments User Context   06/04/2015  2:15 AM 06/06/2015  4:32 PM Full Code [id number]  Marlyce Huge, MD Inpatient    Advance Directive Documentation        Most Recent Value   Type of Advance Directive  Healthcare Power of Attorney, Living will   Pre-existing out of facility DNR order (yellow form or pink MOST form)     "MOST" Form in Place?              DVT Prophylaxis  irn elevated no ac  Lab Results  Component Value Date   PLT 249 12/14/2015     Time Spent in minutes 63min  Greater than 50% of time spent in care coordination and counseling patient regarding the condition and plan of care.   Dustin Flock M.D on 12/17/2015 at 11:47 AM  Between 7am to 6pm - Pager - [phone]  After 6pm go to [email or web address] - password [email or web address] [facility]   Office  [phone]

## 2015-12-17 NOTE — Care Management Important Message (Signed)
Important Message  Patient Details  Name: [name] MRN: [id number] Date of Birth: 11-Apr-1929   Medicare Important Message Given:  Yes    Beverly Sessions, RN 12/17/2015, 12:06 PM

## 2015-12-17 NOTE — Progress Notes (Signed)
Primary nurse requested order for physical therapy evaluation from Dr. Bary Castilla, in order to assess discharge planning needs. Acknowledged. MD to assess patient. Pt resting in bed.   Patient son would like to receive notification when/if surgery is planned for the patient.

## 2015-12-18 ENCOUNTER — Inpatient Hospital Stay: Payer: Medicare Other | Admitting: Certified Registered Nurse Anesthetist

## 2015-12-18 ENCOUNTER — Encounter
Admission: EM | Disposition: A | Discharge status: Home Health | Payer: Self-pay | Source: Home / Self Care | Attending: Surgery

## 2015-12-18 ENCOUNTER — Encounter: Payer: Self-pay | Admitting: *Deleted

## 2015-12-18 HISTORY — PX: VENTRAL HERNIA REPAIR: SHX424

## 2015-12-18 LAB — BASIC METABOLIC PANEL
Anion gap: 9 (ref 5–15)
BUN: 16 mg/dL (ref 6–20)
CO2: 24 mmol/L (ref 22–32)
CREATININE: 0.89 mg/dL (ref 0.44–1.00)
Calcium: 8.5 mg/dL — ABNORMAL LOW (ref 8.9–10.3)
Chloride: 102 mmol/L (ref 101–111)
GFR calc Af Amer: >60 mL/min (ref >60)
GFR, EST NON AFRICAN AMERICAN: 58 mL/min — AB (ref >60)
GLUCOSE: 122 mg/dL — AB (ref 65–99)
POTASSIUM: 3.7 mmol/L (ref 3.5–5.1)
SODIUM: 135 mmol/L (ref 135–145)

## 2015-12-18 LAB — GLUCOSE, CAPILLARY
GLUCOSE-CAPILLARY: 132 mg/dL — AB (ref 65–99)
GLUCOSE-CAPILLARY: 126 mg/dL — AB (ref 65–99)
Glucose-Capillary: 136 mg/dL — ABNORMAL HIGH (ref 65–99)
Glucose-Capillary: 136 mg/dL — ABNORMAL HIGH (ref 65–99)

## 2015-12-18 LAB — MRSA PCR SCREENING: MRSA by PCR: NEGATIVE

## 2015-12-18 LAB — PROTIME-INR
INR: 1.56
Prothrombin Time: 18.7 seconds — ABNORMAL HIGH (ref 11.4–15.0)

## 2015-12-18 SURGERY — REPAIR, HERNIA, VENTRAL
Anesthesia: General

## 2015-12-18 MED ORDER — LIDOCAINE HCL (CARDIAC) 20 MG/ML IV SOLN
INTRAVENOUS | Status: DC | PRN
  Administered 2015-12-18: 80 mg via INTRAVENOUS

## 2015-12-18 MED ORDER — HEPARIN SODIUM (PORCINE) 5000 UNIT/ML IJ SOLN
5000.0000 [IU] | Freq: Three times a day (TID) | INTRAMUSCULAR | Status: DC
  Administered 2015-12-18 – 2015-12-20 (×6): 5000 [IU] via SUBCUTANEOUS
  Filled 2015-12-18 (×6): qty 1

## 2015-12-18 MED ORDER — GLYCOPYRROLATE 0.2 MG/ML IJ SOLN
INTRAMUSCULAR | Status: DC | PRN
  Administered 2015-12-18: .8 mg via INTRAVENOUS

## 2015-12-18 MED ORDER — BUPIVACAINE-EPINEPHRINE (PF) 0.25% -1:200000 IJ SOLN
INTRAMUSCULAR | Status: DC | PRN
  Administered 2015-12-18: 30 mL

## 2015-12-18 MED ORDER — FENTANYL CITRATE (PF) 100 MCG/2ML IJ SOLN
25.0000 ug | INTRAMUSCULAR | Status: AC | PRN
  Administered 2015-12-18 (×6): 25 ug via INTRAVENOUS

## 2015-12-18 MED ORDER — ACETAMINOPHEN 10 MG/ML IV SOLN
INTRAVENOUS | Status: DC | PRN
  Administered 2015-12-18: 1000 mg via INTRAVENOUS

## 2015-12-18 MED ORDER — ONDANSETRON HCL 4 MG/2ML IJ SOLN: 4.0000 mg | Freq: Once | INTRAMUSCULAR | Status: DC | PRN

## 2015-12-18 MED ORDER — ROCURONIUM BROMIDE 100 MG/10ML IV SOLN
INTRAVENOUS | Status: DC | PRN
  Administered 2015-12-18 (×2): 15 mg via INTRAVENOUS

## 2015-12-18 MED ORDER — FENTANYL CITRATE (PF) 100 MCG/2ML IJ SOLN
INTRAMUSCULAR | Status: DC | PRN
  Administered 2015-12-18 (×2): 50 ug via INTRAVENOUS

## 2015-12-18 MED ORDER — HYDROCODONE-ACETAMINOPHEN 5-325 MG PO TABS
1.0000 | ORAL_TABLET | ORAL | Status: DC | PRN
  Administered 2015-12-18 – 2015-12-19 (×2): 1 via ORAL
  Filled 2015-12-18 (×2): qty 1

## 2015-12-18 MED ORDER — BUPIVACAINE-EPINEPHRINE (PF) 0.25% -1:200000 IJ SOLN
INTRAMUSCULAR | Status: AC
  Filled 2015-12-18: qty 30

## 2015-12-18 MED ORDER — WARFARIN - PHYSICIAN DOSING INPATIENT
Freq: Every day | Status: DC
  Administered 2015-12-18: 18:00:00

## 2015-12-18 MED ORDER — CEFAZOLIN SODIUM-DEXTROSE 2-4 GM/100ML-% IV SOLN
2.0000 g | INTRAVENOUS | Status: DC
  Filled 2015-12-18 (×2): qty 100

## 2015-12-18 MED ORDER — SUCCINYLCHOLINE CHLORIDE 20 MG/ML IJ SOLN
INTRAMUSCULAR | Status: DC | PRN
  Administered 2015-12-18: 120 mg via INTRAVENOUS

## 2015-12-18 MED ORDER — WARFARIN SODIUM 6 MG PO TABS
6.0000 mg | ORAL_TABLET | Freq: Every evening | ORAL | Status: DC
  Administered 2015-12-18 – 2015-12-19 (×2): 6 mg via ORAL
  Filled 2015-12-18 (×2): qty 1

## 2015-12-18 MED ORDER — LACTATED RINGERS IV SOLN
INTRAVENOUS | Status: DC | PRN
  Administered 2015-12-18: 11:00:00 via INTRAVENOUS

## 2015-12-18 MED ORDER — FENTANYL CITRATE (PF) 100 MCG/2ML IJ SOLN
INTRAMUSCULAR | Status: AC
  Filled 2015-12-18: qty 2

## 2015-12-18 MED ORDER — ONDANSETRON HCL 4 MG/2ML IJ SOLN
INTRAMUSCULAR | Status: DC | PRN
  Administered 2015-12-18: 4 mg via INTRAVENOUS

## 2015-12-18 MED ORDER — EPHEDRINE SULFATE 50 MG/ML IJ SOLN
INTRAMUSCULAR | Status: DC | PRN
  Administered 2015-12-18: 10 mg via INTRAVENOUS

## 2015-12-18 MED ORDER — PROPOFOL 10 MG/ML IV BOLUS
INTRAVENOUS | Status: DC | PRN
  Administered 2015-12-18: 140 mg via INTRAVENOUS

## 2015-12-18 MED ORDER — CEFAZOLIN SODIUM-DEXTROSE 2-3 GM-% IV SOLR
INTRAVENOUS | Status: DC | PRN
  Administered 2015-12-18: 2 g via INTRAVENOUS

## 2015-12-18 MED ORDER — NEOSTIGMINE METHYLSULFATE 10 MG/10ML IV SOLN
INTRAVENOUS | Status: DC | PRN
  Administered 2015-12-18: 5 mg via INTRAVENOUS

## 2015-12-18 SURGICAL SUPPLY — 35 items
ADHESIVE MASTISOL STRL (MISCELLANEOUS) ×3 IMPLANT
CANISTER SUCT 1200ML W/VALVE (MISCELLANEOUS) ×3 IMPLANT
CHLORAPREP W/TINT 26ML (MISCELLANEOUS) ×3 IMPLANT
CLOSURE WOUND 1/2 X4 (GAUZE/BANDAGES/DRESSINGS) ×1
DRAPE LAPAROTOMY 100X77 ABD (DRAPES) ×3 IMPLANT
DRSG TELFA 3X8 NADH (GAUZE/BANDAGES/DRESSINGS) ×3 IMPLANT
ELECT REM PT RETURN 9FT ADLT (ELECTROSURGICAL) ×3
ELECTRODE REM PT RTRN 9FT ADLT (ELECTROSURGICAL) ×1 IMPLANT
ETHIBOND 2 0 GREEN CT 2 30IN (SUTURE) IMPLANT
GAUZE SPONGE 4X4 12PLY STRL (GAUZE/BANDAGES/DRESSINGS) ×3 IMPLANT
GLOVE BIO SURGEON STRL SZ8 (GLOVE) ×6 IMPLANT
GLOVE BIOGEL M 7.0 STRL (GLOVE) ×6 IMPLANT
GLOVE BIOGEL PI IND STRL 7.0 (GLOVE) ×2 IMPLANT
GLOVE BIOGEL PI INDICATOR 7.0 (GLOVE) ×4
GOWN STRL REUS W/ TWL LRG LVL3 (GOWN DISPOSABLE) ×3 IMPLANT
GOWN STRL REUS W/TWL LRG LVL3 (GOWN DISPOSABLE) ×6
KIT RM TURNOVER STRD PROC AR (KITS) ×3 IMPLANT
LABEL OR SOLS (LABEL) IMPLANT
MESH PATCH KUGEL HERNIA (Mesh General) ×3 IMPLANT
NDL SAFETY 22GX1.5 (NEEDLE) ×3 IMPLANT
NS IRRIG 500ML POUR BTL (IV SOLUTION) ×3 IMPLANT
PACK BASIN MINOR ARMC (MISCELLANEOUS) ×3 IMPLANT
SPONGE LAP 18X18 5 PK (GAUZE/BANDAGES/DRESSINGS) ×3 IMPLANT
STAPLER SKIN PROX 35W (STAPLE) ×3 IMPLANT
STRIP CLOSURE SKIN 1/2X4 (GAUZE/BANDAGES/DRESSINGS) ×2 IMPLANT
SUT MNCRL 4-0 (SUTURE) ×2
SUT MNCRL 4-0 27XMFL (SUTURE) ×1
SUT PROLENE 0 CT 1 30 (SUTURE) ×3 IMPLANT
SUT VIC AB 0 CT2 27 (SUTURE) ×3 IMPLANT
SUT VIC AB 2-0 CT1 27 (SUTURE) ×8
SUT VIC AB 2-0 CT1 TAPERPNT 27 (SUTURE) ×4 IMPLANT
SUT VIC AB 3-0 SH 27 (SUTURE)
SUT VIC AB 3-0 SH 27X BRD (SUTURE) IMPLANT
SUTURE MNCRL 4-0 27XMF (SUTURE) ×1 IMPLANT
SYRINGE 10CC LL (SYRINGE) ×3 IMPLANT

## 2015-12-18 NOTE — OR Nursing (Signed)
Patient appears very anxious and c/o being short of breath, but on room air patient's oxygen level is 99-100.  She refused to keep her oxygen mask on.

## 2015-12-18 NOTE — Anesthesia Procedure Notes (Addendum)
Procedure Name: Intubation Date/Time: 12/18/2015 11:10 AM Performed by: Demetrius Charity Pre-anesthesia Checklist: Patient identified, Patient being monitored, Timeout performed, Emergency Drugs available and Suction available Patient Re-evaluated:Patient Re-evaluated prior to inductionOxygen Delivery Method: Circle system utilized Preoxygenation: Pre-oxygenation with 100% oxygen Intubation Type: IV induction, Cricoid Pressure applied and Rapid sequence Ventilation: Mask ventilation without difficulty Laryngoscope Size: 3 and Glidescope Grade View: Grade I Tube type: Oral Tube size: 7.0 mm Number of attempts: 1 Airway Equipment and Method: Rigid stylet and Video-laryngoscopy Placement Confirmation: ETT inserted through vocal cords under direct vision,  positive ETCO2 and breath sounds checked- equal and bilateral Secured at: 21 cm Tube secured with: Tape Dental Injury: Teeth and Oropharynx as per pre-operative assessment  Difficulty Due To: Difficulty was anticipated, Difficult Airway- due to anterior larynx, Difficult Airway- due to limited oral opening and Difficult Airway- due to reduced neck mobility Future Recommendations: Recommend- induction with short-acting agent, and alternative techniques readily available

## 2015-12-18 NOTE — Anesthesia Preprocedure Evaluation (Signed)
Anesthesia Evaluation  Patient identified by MRN, date of birth, ID band Patient awake    Reviewed: Allergy & Precautions, H&P , NPO status , Patient's Chart, lab work & pertinent test results, reviewed documented beta blocker date and time   Airway Mallampati: II  TM Distance: >3 FB Neck ROM: full    Dental  (+) Teeth Intact   Pulmonary neg pulmonary ROS, neg sleep apnea, Recent URI , Resolved,    Pulmonary exam normal        Cardiovascular Exercise Tolerance: Poor hypertension, On Medications + Peripheral Vascular Disease  negative cardio ROS Normal cardiovascular exam Rhythm:regular Rate:Normal     Neuro/Psych  Neuromuscular disease negative neurological ROS  negative psych ROS   GI/Hepatic negative GI ROS, Neg liver ROS, hiatal hernia, PUD, GERD  Medicated,  Endo/Other  negative endocrine ROSdiabetes  Renal/GU negative Renal ROS  negative genitourinary   Musculoskeletal   Abdominal   Peds  Hematology negative hematology ROS (+)   Anesthesia Other Findings Past Medical History:   Arthritis                                                    Osteoporosis                                                 Glaucoma                                                     Hypertension                                                 Gastric ulcer                                                Hiatal hernia                                                Diabetes mellitus without complication ([place])                 Recurrent deep vein thrombosis (DVT) ([place])                   Breast cancer ([place])                             2014           Comment:right- radiation Past Surgical History:   APPENDECTOMY  TONSILLECTOMY                                                 REPLACEMENT TOTAL KNEE                                        PARTIAL COLECTOMY                                              VENTRAL HERNIA REPAIR                           N/A 06/04/2015     Comment:Procedure:  REPAIR of INCARCERATED VENTRAL               HERNIA;  Surgeon: Marlyce Huge, MD;                Location: [facility] ORS;  Service: General;                Laterality: N/A;   BREAST BIOPSY                                   Right 2014       BMI    Body Mass Index   44.56 kg/m 2     Reproductive/Obstetrics negative OB ROS                             Anesthesia Physical Anesthesia Plan  ASA: III  Anesthesia Plan: General ETT, Rapid Sequence and Cricoid Pressure   Post-op Pain Management:    Induction:   Airway Management Planned:   Additional Equipment:   Intra-op Plan:   Post-operative Plan:   Informed Consent: I have reviewed the patients History and Physical, chart, labs and discussed the procedure including the risks, benefits and alternatives for the proposed anesthesia with the patient or authorized representative who has indicated his/her understanding and acceptance.   Dental Advisory Given  Plan Discussed with: CRNA  Anesthesia Plan Comments:         Anesthesia Quick Evaluation

## 2015-12-18 NOTE — Progress Notes (Signed)
Upon reassessment of pt.'s surgical site, RN noticed that dressing was saturated with serosanguineous and bloody drainage. Dr. Burt Knack was notified, new orders to reinforce dressing with abdominal pads and paper tape. Abdominal binder is intact.   Angus Seller

## 2015-12-18 NOTE — Op Note (Signed)
Abdominal Hernia Repair  Pre-operative Diagnosis: Recurrent incarcerated ventral hernia  Post-operative Diagnosis: Recurrent incarcerated ventral hernia  Surgeon: Jerrol Banana. Burt Knack, MD FACS  Anesthesia: Gen. with endotracheal tube  Assistant: PA student  Procedure: Repair of recurrent incarcerated ventral hernia with mesh  Procedure Details  The patient was seen again in the Holding Room. The benefits, complications, treatment options, and expected outcomes were discussed with the patient. The risks of bleeding, infection, recurrence of symptoms, failure to resolve symptoms, bowel injury, mesh placement, mesh infection, any of which could require further surgery were reviewed with the patient. The likelihood of improving the patient's symptoms with return to their baseline status is good.  The patient and/or family concurred with the proposed plan, giving informed consent.  The patient was taken to Operating Room, identified as [name] and the procedure verified.  A Time Out was held and the above information confirmed.  Prior to the induction of general anesthesia, antibiotic prophylaxis was administered. VTE prophylaxis was in place. General endotracheal anesthesia was then administered and tolerated well. After the induction, the abdomen was prepped with Chloraprep and draped in the sterile fashion. The patient was positioned in the supine position.  A midline incision was utilized to open and explore the subcutaneous tissues tissues hernia sac was opened and reduced 2 separate hernias were identified in the supraumbilical area. There was a 1 cm bridge of fascia between the 2. Once small bowel was dissected off of the abdominal wall and the abdominal wall edges were cleaned the bridge between the 2 hernias was incised and the hernial rent was measured. A 7.6 cm round ventral mesh was placed this was held in with U sutures of 0 Prolene. Hernia sac was dissected free and placed over the top  of the mesh and held in with 2-0 Vicryl U sutures. Marcaine was afraid since obtain tissues for a total of 30 cc and then the wound was closed with interrupted and running layers, multiple, to obliterate dead space and cover the rent and mesh. Staples were placed. Sterile dressing was placed. Sponge lap needle count was correct. Patient was taken to recovery room in stable condition to be admitted for continued care  Findings:  2 separate hernias with incarcerated small bowel easily reduced.  Estimated Blood Loss: 20 cc         Drains: None         Specimens: None         Complications: none               Anish Vana E. Burt Knack, MD, FACS

## 2015-12-18 NOTE — Progress Notes (Signed)
CC: recCurrent incarcerated ventral hernia Subjective: The patient with a recurrent incarcerated ventral hernia which is been reducible in the hospital her INR is improved today she has minimal pain no nausea or vomiting fevers or chills  Objective: Vital signs in last 24 hours: Temp:  [98.1 F (36.7 C)-98.4 F (36.9 C)] 98.4 F (36.9 C) (06/07 0504) Pulse Rate:  [65-71] 66 (06/07 0504) Resp:  [16-18] 16 (06/07 0504) BP: (130-164)/(38-60) 164/53 mmHg (06/07 0504) SpO2:  [97 %-100 %] 98 % (06/07 0504) Last BM Date: 12/15/15  Intake/Output from previous day: 06/06 0701 - 06/07 0700 In: 1098 [P.O.:720; I.V.:378] Out: -  Intake/Output this shift:    Physical exam:  Vital signs are reviewed and stable patient is morbidly obese but comfortable abdomen is soft reducible hernias are noted nontender chest is clear to auscultation cardiac is regular rate and rhythm peripheral edema is noted  Lab Results: CBC  No results for input(s): WBC, HGB, HCT, PLT in the last 72 hours. BMET  Recent Labs  12/18/15 0436  NA 135  K 3.7  CL 102  CO2 24  GLUCOSE 122*  BUN 16  CREATININE 0.89  CALCIUM 8.5*   PT/INR  Recent Labs  12/17/15 0415 12/18/15 0436  LABPROT 23.6* 18.7*  INR 2.12 1.56   ABG No results for input(s): PHART, HCO3 in the last 72 hours.  Invalid input(s): PCO2, PO2  Studies/Results: No results found.  Anti-infectives: Anti-infectives    None      Assessment/Plan:  Recurrent incarcerated ventral hernia with normalized INR today recommend repair with mesh. I discussed with she and her husband the options of observation and the rationale for offering surgery today the risks of bleeding infection recurrence mesh placement mesh infection mesh removal and bowel injury were all reviewed with them they understood and agreed to proceed we also discussed DVT and PE prophylaxis and risks  Florene Glen, MD, FACS  12/18/2015

## 2015-12-18 NOTE — Transfer of Care (Signed)
Immediate Anesthesia Transfer of Care Note  Patient: [name]  Procedure(s) Performed: Procedure(s): repair of recurent incarcerated ventral hernia with mesh (N/A)  Patient Location: PACU  Anesthesia Type:General  Level of Consciousness: awake, alert  and oriented  Airway & Oxygen Therapy: Patient Spontanous Breathing  Post-op Assessment: Report given to RN and Post -op Vital signs reviewed and stable  Post vital signs: Reviewed and stable  Last Vitals:  Filed Vitals:   12/18/15 0504 12/18/15 1033  BP: 164/53   Pulse: 66 72  Temp: 36.9 C 37.3 C  Resp: 16     Last Pain:  Filed Vitals:   12/18/15 1038  PainSc: 0-No pain      Patients Stated Pain Goal: 0 ([id number])  Complications: No apparent anesthesia complications

## 2015-12-19 ENCOUNTER — Encounter: Payer: Self-pay | Admitting: Surgery

## 2015-12-19 LAB — GLUCOSE, CAPILLARY
GLUCOSE-CAPILLARY: 162 mg/dL — AB (ref 65–99)
GLUCOSE-CAPILLARY: 131 mg/dL — AB (ref 65–99)
Glucose-Capillary: 135 mg/dL — ABNORMAL HIGH (ref 65–99)
Glucose-Capillary: 156 mg/dL — ABNORMAL HIGH (ref 65–99)

## 2015-12-19 LAB — PROTIME-INR
INR: 1.26
Prothrombin Time: 15.9 seconds — ABNORMAL HIGH (ref 11.4–15.0)

## 2015-12-19 MED ORDER — HYDROCODONE-ACETAMINOPHEN 5-325 MG PO TABS: 1.0000 | ORAL_TABLET | ORAL | Status: DC | PRN

## 2015-12-19 MED ORDER — MENTHOL 3 MG MT LOZG
1.0000 | LOZENGE | OROMUCOSAL | Status: DC | PRN
  Administered 2015-12-19: 3 mg via ORAL
  Filled 2015-12-19: qty 9

## 2015-12-19 NOTE — Progress Notes (Signed)
1 Day Post-Op  Subjective: Status post recurrent ventral hernia repair with mesh patient has no complaints she took her Coumadin last night is tolerating a regular diet  Objective: Vital signs in last 24 hours: Temp:  [97.7 F (36.5 C)-99.1 F (37.3 C)] 98.1 F (36.7 C) (06/08 0534) Pulse Rate:  [63-88] 76 (06/08 0534) Resp:  [12-20] 20 (06/08 0534) BP: (144-182)/(51-90) 168/66 mmHg (06/08 0534) SpO2:  [95 %-100 %] 97 % (06/08 0534) Last BM Date: 12/18/15  Intake/Output from previous day: 06/07 0701 - 06/08 0700 In: 1618 [P.O.:50; I.V.:1568] Out: 580 [Urine:575; Blood:5] Intake/Output this shift: Total I/O In: 271 [I.V.:271] Out: 200 [Urine:200]  Physical exam:  Comfortable-appearing morbidly obese female patient abdomen is soft nontender no further drainage from wound nontender calves  Lab Results: CBC  No results for input(s): WBC, HGB, HCT, PLT in the last 72 hours. BMET  Recent Labs  12/18/15 0436  NA 135  K 3.7  CL 102  CO2 24  GLUCOSE 122*  BUN 16  CREATININE 0.89  CALCIUM 8.5*   PT/INR  Recent Labs  12/18/15 0436 12/19/15 0434  LABPROT 18.7* 15.9*  INR 1.56 1.26   ABG No results for input(s): PHART, HCO3 in the last 72 hours.  Invalid input(s): PCO2, PO2  Studies/Results: No results found.  Anti-infectives: Anti-infectives    Start     Dose/Rate Route Frequency Ordered Stop   12/18/15 0845  ceFAZolin (ANCEF) IVPB 2g/100 mL premix  Status:  Discontinued     2 g 200 mL/hr over 30 Minutes Intravenous 30 min pre-op 12/18/15 0839 12/18/15 1300      Assessment/Plan: s/p Procedure(s): repair of recurent incarcerated ventral hernia with mesh   INR subtherapeutic. Continue Coumadin and subcutaneous heparin. We'll place dry dressing and probably discharge tomorrow  Florene Glen, MD, FACS  12/19/2015

## 2015-12-19 NOTE — Progress Notes (Signed)
Per Dr. Burt Knack okay to place order for cepacol lozenges

## 2015-12-19 NOTE — Progress Notes (Signed)
[place] [facility]                                                                                                                                                                                            Patient Demographics   [name], is a 82 y.o. female, DOB - [birth year], [id number]  Admit date - 12/13/2015   Admitting Physician Florene Glen, MD  Outpatient Primary MD for the patient is [place], MD   LOS - 6  Subjective: No complaint.  Review of Systems:   CONSTITUTIONAL: No documented fever. No fatigue, weakness. No weight gain, no weight loss.  EYES: No blurry or double vision.  ENT: No tinnitus. No postnasal drip. No redness of the oropharynx.  RESPIRATORY: No cough, no wheeze, no hemoptysis. No dyspnea.  CARDIOVASCULAR: No chest pain. No orthopnea. No palpitations. No syncope.  GASTROINTESTINAL: No nausea, no vomiting or diarrhea. Resolved abdominal pain. No melena or hematochezia.  GENITOURINARY: No dysuria or hematuria.  ENDOCRINE: No polyuria or nocturia. No heat or cold intolerance.  HEMATOLOGY: No anemia. No bruising. No bleeding.  INTEGUMENTARY: No rashes. No lesions.  MUSCULOSKELETAL: No arthritis. No swelling. No gout.  NEUROLOGIC: No numbness, tingling, or ataxia. No seizure-type activity.  PSYCHIATRIC: No anxiety. No insomnia. No ADD.    Vitals:   Filed Vitals:   12/19/15 0041 12/19/15 0534 12/19/15 1000 12/19/15 1300  BP: 161/63 168/66 164/63   Pulse: 70 76 70 72  Temp: 98 F (36.7 C) 98.1 F (36.7 C) 98.2 F (36.8 C)   TempSrc: Oral Oral Oral   Resp: 20 20 20    Height:      Weight:      SpO2: 98% 97% 98% 96%    Wt Readings from Last 3 Encounters:  12/13/15 251 lb 8 oz (114.08 kg)  07/10/15 269 lb 6.4 oz (122.2 kg)  06/20/15 278 lb (126.1 kg)     Intake/Output Summary (Last 24 hours) at 12/19/15 1550 Last data filed at 12/19/15 1234  Gross per 24 hour  Intake   1809 ml  Output     775 ml  Net   1034 ml    Physical Exam:   GENERAL: Pleasant-appearing in no apparent distress. Obese. HEAD, EYES, EARS, NOSE AND THROAT: Atraumatic, normocephalic. Extraocular muscles are intact. Pupils equal and reactive to light. Sclerae anicteric. No conjunctival injection. No oro-pharyngeal erythema.  NECK: Supple. There is no jugular venous distention. No bruits, no lymphadenopathy, no thyromegaly.  HEART: Regular rate and rhythm,. No murmurs, no rubs, no clicks.  LUNGS: Clear to auscultation bilaterally. No  rales or rhonchi. No wheezes.  ABDOMEN: Soft, flat, tenderness on abdominal hernia surgical site,  nondistended. Has good bowel sounds. No hepatosplenomegaly appreciated.  EXTREMITIES: No evidence of any cyanosis, clubbing, or peripheral edema.  +2 pedal and radial pulses bilaterally.  NEUROLOGIC: The patient is alert, awake, and oriented x3 with no focal motor or sensory deficits appreciated bilaterally.  SKIN: Moist and warm with no rashes appreciated.  Psych: Not anxious, depressed LN: No inguinal LN enlargement    Antibiotics   Anti-infectives    Start     Dose/Rate Route Frequency Ordered Stop   12/18/15 0845  ceFAZolin (ANCEF) IVPB 2g/100 mL premix  Status:  Discontinued     2 g 200 mL/hr over 30 Minutes Intravenous 30 min pre-op 12/18/15 0839 12/18/15 1300      Medications   Scheduled Meds: . allopurinol  150 mg Oral Daily  . anastrozole  1 mg Oral Daily  . atenolol  50 mg Oral QPM  . heparin subcutaneous  5,000 Units Subcutaneous Q8H  . insulin aspart  0-9 Units Subcutaneous TID WC  . latanoprost  1 drop Both Eyes QHS  . lisinopril  30 mg Oral BID  . pantoprazole  40 mg Oral Daily  . warfarin  6 mg Oral QPM  . Warfarin - Physician Dosing Inpatient   Does not apply q1800   Continuous Infusions: . sodium chloride 75 mL/hr at 12/19/15 0000   PRN Meds:.hydrALAZINE, HYDROcodone-acetaminophen, menthol-cetylpyridinium, morphine injection, ondansetron **OR**  ondansetron (ZOFRAN) IV   Data Review:   Micro Results Recent Results (from the past 240 hour(s))  MRSA PCR Screening     Status: None   Collection Time: 12/15/15  9:50 AM  Result Value Ref Range Status   MRSA by PCR NEGATIVE NEGATIVE Final    Comment:        The GeneXpert MRSA Assay (FDA approved for NASAL specimens only), is one component of a comprehensive MRSA colonization surveillance program. It is not intended to diagnose MRSA infection nor to guide or monitor treatment for MRSA infections.   MRSA PCR Screening     Status: None   Collection Time: 12/18/15  9:29 AM  Result Value Ref Range Status   MRSA by PCR NEGATIVE NEGATIVE Final    Comment:        The GeneXpert MRSA Assay (FDA approved for NASAL specimens only), is one component of a comprehensive MRSA colonization surveillance program. It is not intended to diagnose MRSA infection nor to guide or monitor treatment for MRSA infections.     Radiology Reports Ct Abdomen Pelvis W Contrast  12/13/2015  CLINICAL DATA:  Progressive lower abdominal pain. Chronic periumbilical hernias. EXAM: CT ABDOMEN AND PELVIS WITH CONTRAST TECHNIQUE: Multidetector CT imaging of the abdomen and pelvis was performed using the standard protocol following bolus administration of intravenous contrast. CONTRAST:  180mL ISOVUE-300 IOPAMIDOL (ISOVUE-300) INJECTION 61% COMPARISON:  None. FINDINGS: Lower chest: No acute abnormalities. Slight coronary artery calcification. Slight chronic changes at the lung bases. Hepatobiliary: Normal. Pancreas: Normal. Spleen: Normal. Adrenals/Urinary Tract: Normal. Stomach/Bowel: The patient has an incarcerated periumbilical hernia just below the umbilicus. There is a loop of small bowel which extends into the hernia sac and is dilated proximal to the sac and decompressed distal to the sac consistent with partial obstruction. There is edema in the soft tissues around the hernia and there is also a small amount  of fluid in the hernia sac. There is also a focal hernia just above the umbilicus without  incarceration. The patient has had a hemicolectomy. There are multiple diverticula in the remaining portion of the colon. Vascular/Lymphatic: Minimal calcification in the abdominal aorta. No adenopathy. Reproductive: Atrophic uterus and ovaries are normal. Other: No free air or free fluid. Musculoskeletal: No acute bone abnormality. Moderately severe right hip arthritis. Moderate left hip arthritis. Degenerative disc and facet joint disease in the lumbar spine. IMPRESSION: Incarcerated periumbilical hernia containing a loop of small bowel creating partial small bowel obstruction. Inflammation in and around the hernia sac. Critical Value/emergent results were called by telephone at the time of interpretation on 12/13/2015 at ~2:53pm to Dr. Ramonita Lab , who verbally acknowledged these results. Electronically Signed   By: Lorriane Shire M.D.   On: 12/13/2015 15:59     CBC  Recent Labs Lab 12/13/15 1603 12/14/15 0529  WBC 12.6* 8.4  HGB 11.9* 11.9*  HCT 36.1 34.7*  PLT 262 249  MCV 92.9 91.0  MCH 30.5 31.3  MCHC 32.8 34.4  RDW 14.5 14.0  LYMPHSABS 2.6  --   MONOABS 0.7  --   EOSABS 0.1  --   BASOSABS 0.2*  --     Chemistries   Recent Labs Lab 12/13/15 1603 12/14/15 0529 12/18/15 0436  NA 129* 133* 135  K 4.2 4.7 3.7  CL 94* 101 102  CO2 23 24 24   GLUCOSE 110* 124* 122*  BUN 22* 18 16  CREATININE 1.26* 1.20* 0.89  CALCIUM 9.4 8.9 8.5*  AST 33  --   --   ALT 26  --   --   ALKPHOS 68  --   --   BILITOT 0.3  --   --    ------------------------------------------------------------------------------------------------------------------ estimated creatinine clearance is 57.3 mL/min (by C-G formula based on Cr of 0.89). ------------------------------------------------------------------------------------------------------------------ No results for input(s): HGBA1C in the last 72  hours. ------------------------------------------------------------------------------------------------------------------ No results for input(s): CHOL, HDL, LDLCALC, TRIG, CHOLHDL, LDLDIRECT in the last 72 hours. ------------------------------------------------------------------------------------------------------------------ No results for input(s): TSH, T4TOTAL, T3FREE, THYROIDAB in the last 72 hours.  Invalid input(s): FREET3 ------------------------------------------------------------------------------------------------------------------ No results for input(s): VITAMINB12, FOLATE, FERRITIN, TIBC, IRON, RETICCTPCT in the last 72 hours.  Coagulation profile  Recent Labs Lab 12/15/15 0509 12/16/15 0654 12/17/15 0415 12/18/15 0436 12/19/15 0434  INR 4.07* 2.99 2.12 1.56 1.26    No results for input(s): DDIMER in the last 72 hours.  Cardiac Enzymes No results for input(s): CKMB, TROPONINI, MYOGLOBIN in the last 168 hours.  Invalid input(s): CK ------------------------------------------------------------------------------------------------------------------ Invalid input(s): [place]  Pt is 82 y.o Incarcerated hernia     * Hypertension -Continue current regimen with lisinopril and atenolol,  and when necessary hydralazine increase atenolol   * Diabetes. Controlled.  SSI   Continue to monitor  * History of DVT Resumed coumadin after surgery last night, f/u INR.  * Glaucoma  Continue eye drops.   * incarcerted hernia  S/p repair. POD 1. Pain control.  Advance diet.   PT evaluation: HHPT.    Code Status Orders        Start     Ordered   12/13/15 1636  Full code   Continuous     12/13/15 1638    Code Status History    Date Active Date Inactive Code Status Order ID Comments User Context   06/04/2015  2:15 AM 06/06/2015  4:32 PM Full Code [id number]  Marlyce Huge, MD Inpatient    Advance Directive Documentation        Most  Recent  Value   Type of Advance Directive  Healthcare Power of Attorney, Living will   Pre-existing out of facility DNR order (yellow form or pink MOST form)     "MOST" Form in Place?              DVT Prophylaxis  irn elevated no ac  Lab Results  Component Value Date   PLT 249 12/14/2015     Time Spent in minutes 44min  Greater than 50% of time spent in care coordination and counseling patient regarding the condition and plan of care.   Demetrios Loll M.D on 12/19/2015 at 3:50 PM  Between 7am to 6pm - Pager - [phone]  After 6pm go to [email or web address] - password [email or web address] [facility]   Office  [phone]

## 2015-12-19 NOTE — Care Management (Signed)
Patient status post hernia repair 12/18/15.  Patient lives at home with her husband.  Husband provides transportation. Obtains medications from [facility].  Patient has PCS services through [facility] 4 hours a day.  3 days a week.  PT consult pending. RNCM following.

## 2015-12-19 NOTE — Evaluation (Signed)
Physical Therapy Evaluation Patient Details Name: [name] MRN: [id number] DOB: Oct 16, 1928 Today's Date: 12/19/2015   History of Present Illness  82 yo F presented to ED on 6/2 from the doctor's office due to abdominal pain found to be an incarcerated hernia. Pt has recurrent ventral hernias. She underwent repair of the hernia on 6/7. PMH includes DM, breast cancer, ventral hernia repair 2016.  Clinical Impression  Pt demonstrated generalized weakness and difficulty walking after extended hospitalization and surgical hernia repair. B LE strength grossly 4/5 and balance is good with UE support. She is min guard for transfers and ambulation up to 100 ft with SPC. She demonstrated slow but steady gait with no buckling or LOB. Assistance for mobility the first couple of days home was recommended to pt and husband. HHPT recommended to address deficits of strength, balance, endurance and gait to progress towards PLOF. Pt will benefit from skilled PT services to increase functional I and mobility for safe discharge.     Follow Up Recommendations Home health PT;Supervision for mobility/OOB    Equipment Recommendations  None recommended by PT (Pt has recommended SPC)   Recommendations for Other Services       Precautions / Restrictions Precautions Precautions: Fall Restrictions Weight Bearing Restrictions: No      Mobility  Bed Mobility               General bed mobility comments: up in recliner, NT  Transfers Overall transfer level: Needs assistance Equipment used: Straight cane Transfers: Sit to/from Stand;Stand Pivot Transfers Sit to Stand: Min guard Stand pivot transfers: Min guard       General transfer comment: good use of hands, safe, stable with no LOB  Ambulation/Gait Ambulation/Gait assistance: Min guard Ambulation Distance (Feet): 100 Feet Assistive device: Straight cane Gait Pattern/deviations: Decreased stride length Gait velocity: reduced Gait velocity  interpretation: Below normal speed for age/gender General Gait Details: Slow but steady gait with no LOB  Stairs            Wheelchair Mobility    Modified Rankin (Stroke Patients Only)       Balance Overall balance assessment: Needs assistance Sitting-balance support: Feet supported Sitting balance-Leahy Scale: Good Sitting balance - Comments: causes abdominal pain at surgical site   Standing balance support: Single extremity supported Standing balance-Leahy Scale: Good                               Pertinent Vitals/Pain Pain Assessment: 0-10 Pain Score: 4  Pain Location: abdomin Pain Descriptors / Indicators: Aching;Operative site guarding Pain Intervention(s): Limited activity within patient's tolerance;Monitored during session;Repositioned    Home Living Family/patient expects to be discharged to:: Private residence Living Arrangements: Spouse/significant other Available Help at Discharge: Family;Available 24 hours/day Type of Home: House Home Access: Stairs to enter Entrance Stairs-Rails: Psychiatric nurse of Steps: 3 Home Layout: Two level;Able to live on main level with bedroom/bathroom Home Equipment: Kasandra Knudsen - single point Additional Comments: [facility] aide comes 3x/wk for 4 hours    Prior Function Level of Independence: Independent with assistive device(s)         Comments: Household ambulator with SPC, I with basic ADLs, husband assists as needed     Hand Dominance   Dominant Hand: Right    Extremity/Trunk Assessment   Upper Extremity Assessment: Overall WFL for tasks assessed           Lower Extremity Assessment: Generalized weakness (grossly  4/5 B LE)         Communication   Communication: No difficulties  Cognition Arousal/Alertness: Awake/alert Behavior During Therapy: WFL for tasks assessed/performed Overall Cognitive Status: Within Functional Limits for tasks assessed                       General Comments General comments (skin integrity, edema, etc.): abdominal binder donned    Exercises Other Exercises Other Exercises: B LE seated therex: ankle pumps, LAQs, marching, hip add squeezes, hip abd slides x15 each. Cues for technique. Causes minimal increase in abdominal pain.       Assessment/Plan    PT Assessment Patient needs continued PT services  PT Diagnosis Difficulty walking;Generalized weakness   PT Problem List Decreased strength;Decreased activity tolerance;Decreased balance;Decreased mobility;Obesity;Decreased skin integrity  PT Treatment Interventions Gait training;Stair training;Therapeutic activities;Therapeutic exercise;Balance training;Neuromuscular re-education;Patient/family education   PT Goals (Current goals can be found in the Care Plan section) Acute Rehab PT Goals Patient Stated Goal: to improve my ability to walk PT Goal Formulation: With patient/family Time For Goal Achievement: 01/02/16    Frequency Min 2X/week   Barriers to discharge Inaccessible home environment steps to enter    Co-evaluation               End of Session   Activity Tolerance: Patient tolerated treatment well Patient left: in chair;with call bell/phone within reach;with chair alarm set;with family/visitor present Nurse Communication: Mobility status         Time: 1400-1426 PT Time Calculation (min) (ACUTE ONLY): 26 min   Charges:   PT Evaluation $PT Eval Moderate Complexity: 1 Procedure PT Treatments $Therapeutic Exercise: 8-22 mins   PT G Codes:        Neoma Laming, PT, DPT  12/19/2015, 2:48 PM [phone]

## 2015-12-19 NOTE — Clinical Social Work Note (Signed)
PT has evaluated patient and recommending home health. PT also stated that patient did not require assistance with sit to stand. CSW has spoken to patient and her husband this afternoon and CSW expressed concern over patient's husband trying to do too much and advised that he cannot be pulling or lifting on patient as he could hurt himself. Patient's husband's response was "good luck with that" and just smiled at me. RN CM to speak with patient's husband regarding home health arrangements.  Shela Leff MSW,LCSW [phone]

## 2015-12-19 NOTE — Progress Notes (Signed)
MD notified of low DBP, received orders per dr. Dahlia Byes to d/c lisinopril for now. Will continue to monitor.

## 2015-12-19 NOTE — Discharge Instructions (Signed)
Resume all home medications including Coumadin Regular diet May shower Dry dressing as needed Follow-up with Dr. Burt Knack in 2 weeks and primary care physician in 2 weeks

## 2015-12-19 NOTE — Progress Notes (Signed)
Pt refuses bed alarm, son at bedside.

## 2015-12-20 LAB — BASIC METABOLIC PANEL
Anion gap: 10 (ref 5–15)
BUN: 11 mg/dL (ref 6–20)
CALCIUM: 8.6 mg/dL — AB (ref 8.9–10.3)
CO2: 22 mmol/L (ref 22–32)
CREATININE: 0.81 mg/dL (ref 0.44–1.00)
Chloride: 103 mmol/L (ref 101–111)
GFR calc Af Amer: >60 mL/min (ref >60)
Glucose, Bld: 133 mg/dL — ABNORMAL HIGH (ref 65–99)
Potassium: 3.9 mmol/L (ref 3.5–5.1)
Sodium: 135 mmol/L (ref 135–145)

## 2015-12-20 LAB — CBC
HCT: 33.2 % — ABNORMAL LOW (ref 35.0–47.0)
HEMOGLOBIN: 11.3 g/dL — AB (ref 12.0–16.0)
MCH: 31.3 pg (ref 26.0–34.0)
MCHC: 34.1 g/dL (ref 32.0–36.0)
MCV: 92 fL (ref 80.0–100.0)
PLATELETS: 250 10*3/uL (ref 150–440)
RBC: 3.61 MIL/uL — ABNORMAL LOW (ref 3.80–5.20)
RDW: 14.2 % (ref 11.5–14.5)
WBC: 10.4 10*3/uL (ref 3.6–11.0)

## 2015-12-20 LAB — PROTIME-INR
INR: 1.2
PROTHROMBIN TIME: 15.4 s — AB (ref 11.4–15.0)

## 2015-12-20 LAB — GLUCOSE, CAPILLARY
GLUCOSE-CAPILLARY: 132 mg/dL — AB (ref 65–99)
GLUCOSE-CAPILLARY: 180 mg/dL — AB (ref 65–99)

## 2015-12-20 NOTE — Progress Notes (Signed)
12/20/2015  BP 155/38 mmHg  Pulse 76  Temp(Src) 98.4 F (36.9 C) (Oral)  Resp 20  Ht 5\' 3"  (1.6 m)  Wt 114.08 kg (251 lb 8 oz)  BMI 44.56 kg/m2  SpO2 97% Patient discharged per MD orders. Discharge instructions reviewed with patient and husband, and patient and husband  verbalized understanding. IV removed per policy. Prescriptions discussed and given to patient. Discharged via wheelchair escorted by nursing staff.  Almedia Balls, RN

## 2015-12-20 NOTE — Progress Notes (Signed)
PT Cancellation Note  Patient Details Name: NASH RUBENACKER MRN: [id number] DOB: [birth year]   Cancelled Treatment:    Reason Eval/Treat Not Completed: Patient declined, no reason specified. Pt refused to participate in therapy at this time despite encouragement. She stated "I'm just too upset for that right now". Pt scheduled for discharge today.   Neoma Laming, PT, DPT  12/20/2015, 11:44 AM [phone]

## 2015-12-20 NOTE — Progress Notes (Signed)
[place] [facility]                                                                                                                                                                                            Patient Demographics   [name], is a 82 y.o. female, DOB - [birth year], [id number]  Admit date - 12/13/2015   Admitting Physician Florene Glen, MD  Outpatient Primary MD for the patient is [place], MD   LOS - 7  Subjective: No complaint.  Review of Systems:   CONSTITUTIONAL: No documented fever. No fatigue, weakness. No weight gain, no weight loss.  EYES: No blurry or double vision.  ENT: No tinnitus. No postnasal drip. No redness of the oropharynx.  RESPIRATORY: No cough, no wheeze, no hemoptysis. No dyspnea.  CARDIOVASCULAR: No chest pain. No orthopnea. No palpitations. No syncope.  GASTROINTESTINAL: No nausea, no vomiting or diarrhea. Resolved abdominal pain. No melena or hematochezia.  GENITOURINARY: No dysuria or hematuria.  ENDOCRINE: No polyuria or nocturia. No heat or cold intolerance.  HEMATOLOGY: No anemia. No bruising. No bleeding.  INTEGUMENTARY: No rashes. No lesions.  MUSCULOSKELETAL: No arthritis. No swelling. No gout.  NEUROLOGIC: No numbness, tingling, or ataxia. No seizure-type activity.  PSYCHIATRIC: No anxiety. No insomnia. No ADD.    Vitals:   Filed Vitals:   12/19/15 2011 12/19/15 2054 12/20/15 0515 12/20/15 0526  BP: 142/39 135/34 172/53 155/38  Pulse: 66 73 72 76  Temp: 98.1 F (36.7 C)  98.4 F (36.9 C)   TempSrc: Oral  Oral   Resp: 20  20   Height:      Weight:      SpO2: 98%  97%     Wt Readings from Last 3 Encounters:  12/13/15 251 lb 8 oz (114.08 kg)  07/10/15 269 lb 6.4 oz (122.2 kg)  06/20/15 278 lb (126.1 kg)     Intake/Output Summary (Last 24 hours) at 12/20/15 1239 Last data filed at 12/20/15 0900  Gross per 24 hour  Intake    240 ml  Output      0 ml  Net    240  ml    Physical Exam:   GENERAL: Pleasant-appearing in no apparent distress. Obese. HEAD, EYES, EARS, NOSE AND THROAT: Atraumatic, normocephalic. Extraocular muscles are intact. Pupils equal and reactive to light. Sclerae anicteric. No conjunctival injection. No oro-pharyngeal erythema.  NECK: Supple. There is no jugular venous distention. No bruits, no lymphadenopathy, no thyromegaly.  HEART: Regular rate and rhythm,. No murmurs, no rubs, no clicks.  LUNGS: Clear to auscultation bilaterally.  No rales or rhonchi. No wheezes.  ABDOMEN: Soft, flat, tenderness on abdominal hernia surgical site,  nondistended. Has good bowel sounds. No hepatosplenomegaly appreciated.  EXTREMITIES: No evidence of any cyanosis, clubbing, or peripheral edema.  +2 pedal and radial pulses bilaterally.  NEUROLOGIC: The patient is alert, awake, and oriented x3 with no focal motor or sensory deficits appreciated bilaterally.  SKIN: Moist and warm with no rashes appreciated.  Psych: Not anxious, depressed LN: No inguinal LN enlargement    Antibiotics   Anti-infectives    Start     Dose/Rate Route Frequency Ordered Stop   12/18/15 0845  ceFAZolin (ANCEF) IVPB 2g/100 mL premix  Status:  Discontinued     2 g 200 mL/hr over 30 Minutes Intravenous 30 min pre-op 12/18/15 0839 12/18/15 1300      Medications   Scheduled Meds: . allopurinol  150 mg Oral Daily  . anastrozole  1 mg Oral Daily  . atenolol  50 mg Oral QPM  . heparin subcutaneous  5,000 Units Subcutaneous Q8H  . insulin aspart  0-9 Units Subcutaneous TID WC  . latanoprost  1 drop Both Eyes QHS  . pantoprazole  40 mg Oral Daily  . warfarin  6 mg Oral QPM  . Warfarin - Physician Dosing Inpatient   Does not apply q1800   Continuous Infusions:   PRN Meds:.hydrALAZINE, HYDROcodone-acetaminophen, menthol-cetylpyridinium, morphine injection, ondansetron **OR** ondansetron (ZOFRAN) IV   Data Review:   Micro Results Recent Results (from the past 240  hour(s))  MRSA PCR Screening     Status: None   Collection Time: 12/15/15  9:50 AM  Result Value Ref Range Status   MRSA by PCR NEGATIVE NEGATIVE Final    Comment:        The GeneXpert MRSA Assay (FDA approved for NASAL specimens only), is one component of a comprehensive MRSA colonization surveillance program. It is not intended to diagnose MRSA infection nor to guide or monitor treatment for MRSA infections.   MRSA PCR Screening     Status: None   Collection Time: 12/18/15  9:29 AM  Result Value Ref Range Status   MRSA by PCR NEGATIVE NEGATIVE Final    Comment:        The GeneXpert MRSA Assay (FDA approved for NASAL specimens only), is one component of a comprehensive MRSA colonization surveillance program. It is not intended to diagnose MRSA infection nor to guide or monitor treatment for MRSA infections.     Radiology Reports Ct Abdomen Pelvis W Contrast  12/13/2015  CLINICAL DATA:  Progressive lower abdominal pain. Chronic periumbilical hernias. EXAM: CT ABDOMEN AND PELVIS WITH CONTRAST TECHNIQUE: Multidetector CT imaging of the abdomen and pelvis was performed using the standard protocol following bolus administration of intravenous contrast. CONTRAST:  142mL ISOVUE-300 IOPAMIDOL (ISOVUE-300) INJECTION 61% COMPARISON:  None. FINDINGS: Lower chest: No acute abnormalities. Slight coronary artery calcification. Slight chronic changes at the lung bases. Hepatobiliary: Normal. Pancreas: Normal. Spleen: Normal. Adrenals/Urinary Tract: Normal. Stomach/Bowel: The patient has an incarcerated periumbilical hernia just below the umbilicus. There is a loop of small bowel which extends into the hernia sac and is dilated proximal to the sac and decompressed distal to the sac consistent with partial obstruction. There is edema in the soft tissues around the hernia and there is also a small amount of fluid in the hernia sac. There is also a focal hernia just above the umbilicus without  incarceration. The patient has had a hemicolectomy. There are multiple diverticula in the remaining portion  of the colon. Vascular/Lymphatic: Minimal calcification in the abdominal aorta. No adenopathy. Reproductive: Atrophic uterus and ovaries are normal. Other: No free air or free fluid. Musculoskeletal: No acute bone abnormality. Moderately severe right hip arthritis. Moderate left hip arthritis. Degenerative disc and facet joint disease in the lumbar spine. IMPRESSION: Incarcerated periumbilical hernia containing a loop of small bowel creating partial small bowel obstruction. Inflammation in and around the hernia sac. Critical Value/emergent results were called by telephone at the time of interpretation on 12/13/2015 at ~2:53pm to Dr. Ramonita Lab , who verbally acknowledged these results. Electronically Signed   By: Lorriane Shire M.D.   On: 12/13/2015 15:59     CBC  Recent Labs Lab 12/13/15 1603 12/14/15 0529 12/20/15 0524  WBC 12.6* 8.4 10.4  HGB 11.9* 11.9* 11.3*  HCT 36.1 34.7* 33.2*  PLT 262 249 250  MCV 92.9 91.0 92.0  MCH 30.5 31.3 31.3  MCHC 32.8 34.4 34.1  RDW 14.5 14.0 14.2  LYMPHSABS 2.6  --   --   MONOABS 0.7  --   --   EOSABS 0.1  --   --   BASOSABS 0.2*  --   --     Chemistries   Recent Labs Lab 12/13/15 1603 12/14/15 0529 12/18/15 0436 12/20/15 0524  NA 129* 133* 135 135  K 4.2 4.7 3.7 3.9  CL 94* 101 102 103  CO2 23 24 24 22   GLUCOSE 110* 124* 122* 133*  BUN 22* 18 16 11   CREATININE 1.26* 1.20* 0.89 0.81  CALCIUM 9.4 8.9 8.5* 8.6*  AST 33  --   --   --   ALT 26  --   --   --   ALKPHOS 68  --   --   --   BILITOT 0.3  --   --   --    ------------------------------------------------------------------------------------------------------------------ estimated creatinine clearance is 62.9 mL/min (by C-G formula based on Cr of 0.81). ------------------------------------------------------------------------------------------------------------------ No results  for input(s): HGBA1C in the last 72 hours. ------------------------------------------------------------------------------------------------------------------ No results for input(s): CHOL, HDL, LDLCALC, TRIG, CHOLHDL, LDLDIRECT in the last 72 hours. ------------------------------------------------------------------------------------------------------------------ No results for input(s): TSH, T4TOTAL, T3FREE, THYROIDAB in the last 72 hours.  Invalid input(s): FREET3 ------------------------------------------------------------------------------------------------------------------ No results for input(s): VITAMINB12, FOLATE, FERRITIN, TIBC, IRON, RETICCTPCT in the last 72 hours.  Coagulation profile  Recent Labs Lab 12/16/15 0654 12/17/15 0415 12/18/15 0436 12/19/15 0434 12/20/15 0524  INR 2.99 2.12 1.56 1.26 1.20    No results for input(s): DDIMER in the last 72 hours.  Cardiac Enzymes No results for input(s): CKMB, TROPONINI, MYOGLOBIN in the last 168 hours.  Invalid input(s): CK ------------------------------------------------------------------------------------------------------------------ Invalid input(s): [place]  Pt is 82 y.o Incarcerated hernia     * Hypertension, controlled. -Continue current regimen with lisinopril and atenolol.  * Diabetes. Controlled.  SSI   Continue to monitor  * History of DVT Resumed coumadin after surgery last night, f/u INR.  * Glaucoma  Continue eye drops.   * incarcerted hernia  S/p repair. POD 2. Pain control.  tolerated diet.   PT evaluation: HHPT.  Medically stable, discharge home per surgeon. Sign off.    Code Status Orders        Start     Ordered   12/13/15 1636  Full code   Continuous     12/13/15 1638    Code Status History    Date Active Date Inactive Code Status Order ID Comments User Context   06/04/2015  2:15 AM 06/06/2015  4:32 PM Full Code [id number]  Marlyce Huge, MD  Inpatient    Advance Directive Documentation        Most Recent Value   Type of Advance Directive  Healthcare Power of Attorney, Living will   Pre-existing out of facility DNR order (yellow form or pink MOST form)     "MOST" Form in Place?              DVT Prophylaxis  irn elevated no ac  Lab Results  Component Value Date   PLT 250 12/20/2015     Time Spent in minutes 55min  Greater than 50% of time spent in care coordination and counseling patient regarding the condition and plan of care.   Demetrios Loll M.D on 12/20/2015 at 12:39 PM  Between 7am to 6pm - Pager - [phone]  After 6pm go to [email or web address] - password [email or web address] [facility]   Office  [phone]

## 2015-12-20 NOTE — Care Management (Signed)
PT has recommended home health PT with no other additional equipment. Home health agency list provided to patient and husband.  [facility].  Corene Cornea with [facility] notified. Home health orders have been placed.  Family to transport.  RNCM signing off.

## 2015-12-20 NOTE — Care Management Important Message (Signed)
Important Message  Patient Details  Name: [name] MRN: [id number] Date of Birth: 09/09/1928   Medicare Important Message Given:  Yes    Beverly Sessions, RN 12/20/2015, 8:54 AM

## 2015-12-25 ENCOUNTER — Other Ambulatory Visit: Payer: Self-pay

## 2015-12-26 NOTE — Discharge Summary (Signed)
Physician Discharge Summary  Patient ID: [name] MRN: [id number] DOB/AGE: [birth year] 82 y.o.  Admit date: 12/13/2015 Discharge date: 12/26/2015   Discharge Diagnoses:  Active Problems:   Recurrent ventral hernia with incarceration   Periumbilical abdominal pain   Procedures:Repair of recurrent ventral hernia with mesh  Hospital Course: This is a morbidly obese patient with multiple medical problems who presented with a recurrent incarcerated ventral hernia. She had a primary repair of the same hernia several months ago and it has recurred and she sewn signs of incarceration. In the emergency room this was easily reducible but the patient was on Coumadin and repair with mesh with any resultant hematoma would considerably add infection risk to this patient's condition.  Data my the patient was admitted to the hospital placed on IV antibiotics and allowed to gradually have her INR which was initially over 4 come back towards normal. She was then taken the operating room where repair of this recurrent ventral hernia was performed with mesh. Coumadin was restarted. She made a non-, K to postoperative recovery was discharged in stable condition to follow-up in my office in 10 days structures to shower and staples will be removed in the office.  Consults: General medicine and cardiology  Disposition: 01-Home or Self Care  Discharge Instructions    Diet - low sodium heart healthy    Complete by:  As directed      Discharge instructions    Complete by:  As directed   Inguinal Hernia, Adult  Care After  Refer to this sheet in the next few weeks. These discharge instructions provide you with general information on caring for yourself after you leave the hospital. Your caregiver may also give you specific instructions. Your treatment has been planned according to the most current medical practices available, but unavoidable complications sometimes occur. If you have any problems or questions  after discharge, please call your caregiver.  HOME CARE INSTRUCTIONS  Change bandages (dressings) as directed.  Keep the wound dry and clean. The wound may be washed gently with soap and water. Gently blot or dab the wound dry. It is okay to take showers 24 to 48 hours after surgery. Do not take baths, use swimming pools, or use hot tubs for 10 days, or as directed by your caregiver.  Only take over-the-counter or prescription medicines for pain, discomfort, or fever as directed by your caregiver.  Continue your normal diet as directed.  Do not lift anything more than 10 pounds or play contact sports for 3 weeks, or as directed. SEEK MEDICAL CARE IF:  There is redness, swelling, or increasing pain in the wound.  There is fluid (pus) coming from the wound.  There is drainage from a wound lasting longer than 1 day.  You have an oral temperature above 102 F (38.9 C).  You notice a bad smell coming from the wound or dressing.  The wound breaks open after the stitches (sutures) have been removed.  You notice increasing pain in the shoulders (shoulder strap areas).  You develop dizzy episodes or fainting while standing.  You feel sick to your stomach (nauseous) or throw up (vomit). SEEK IMMEDIATE MEDICAL CARE IF:  You have difficulty breathing.  You develop a reaction or have side effects to medicines you were given. MAKE SURE YOU:  Understand these instructions.  Will watch your condition.  Will get help right away if you are not doing well or get worse. Call office for further problems  Driving Restrictions    Complete by:  As directed   Do not drive while taking pain medicine or continuing physical therapy     Increase activity slowly    Complete by:  As directed      Lifting restrictions    Complete by:  As directed   Do not lift anything heavier than a dinner plate            Medication List    TAKE these medications        allopurinol 300 MG tablet  Commonly known as:   ZYLOPRIM  Take 150 mg by mouth daily.     anastrozole 1 MG tablet  Commonly known as:  ARIMIDEX  Take 1 tablet (1 mg total) by mouth daily.     ascorbic acid 1000 MG tablet  Commonly known as:  VITAMIN C  Take 1,000 mg by mouth daily.     aspirin EC 81 MG tablet  Take 81 mg by mouth every evening.     atenolol 25 MG tablet  Commonly known as:  TENORMIN  Take 25 mg by mouth every evening.     CALCIUM 1200+D3 600-40-500 MG-MG-UNIT Tb24  Generic drug:  Calcium-Magnesium-Vitamin D  Take 1 tablet by mouth 2 (two) times daily.     cholecalciferol 1000 units tablet  Commonly known as:  VITAMIN D  Take 1,000 Units by mouth daily.     docusate sodium 100 MG capsule  Commonly known as:  COLACE  Take 100 mg by mouth at bedtime as needed for mild constipation.     FOLIC ACID PO  Take 1 tablet by mouth every evening.     hydrochlorothiazide 12.5 MG capsule  Commonly known as:  MICROZIDE  Take 12.5 mg by mouth every Monday, Wednesday, and Friday.     HYDROcodone-acetaminophen 5-325 MG tablet  Commonly known as:  NORCO/VICODIN  Take 1 tablet by mouth every 4 (four) hours as needed for moderate pain.     latanoprost 0.005 % ophthalmic solution  Commonly known as:  XALATAN  Place 1 drop into both eyes at bedtime.     lisinopril 30 MG tablet  Commonly known as:  PRINIVIL,ZESTRIL  Take 30 mg by mouth 2 (two) times daily.     multivitamin with minerals Tabs tablet  Take 1 tablet by mouth daily.     omeprazole 20 MG capsule  Commonly known as:  PRILOSEC  Take 20 mg by mouth daily. Pt only uses one capsule in the evening if needed.     OSTEO BI-FLEX ADV DOUBLE ST PO  Take 1 tablet by mouth 2 (two) times daily.     pioglitazone 30 MG tablet  Commonly known as:  ACTOS  Take 30 mg by mouth daily.     risedronate 150 MG tablet  Commonly known as:  ACTONEL  Take 150 mg by mouth every 30 (thirty) days. with water on empty stomach, nothing by mouth or lie down for next 30 minutes.      sitaGLIPtin 100 MG tablet  Commonly known as:  JANUVIA  Take 100 mg by mouth every evening.     warfarin 6 MG tablet  Commonly known as:  COUMADIN  Take 6 mg by mouth every evening.           Follow-up Information    Follow up with Phoebe Perch, MD. Go on 01/02/2016.   Specialty:  Surgery   Why:  @11am      For wound re-check  Contact information:   [address] [place] [address] [phone]       Florene Glen, MD, FACS

## 2015-12-27 ENCOUNTER — Telehealth: Payer: Self-pay

## 2015-12-27 NOTE — Telephone Encounter (Signed)
See additional message.

## 2015-12-27 NOTE — Telephone Encounter (Signed)
Received fax from [facility] requesting signed order for Physical Therapy. However after reviewing the patients chart notes there is a note from PT, stating the patient is not interested at PT at this time placed by Mindy Drewwtte on 12/20/15.  Call was placed to the patient to inquire, however  she was napping at the time and her husband reiterated that she was not interested in PT. He stated she was stronger and able to do things on her own, climb steps, get out of chair and that she was doing well and did not feel the need for PT at this time. She is scheduled to see Dr.Cooper 01/02/16 at 11:00am.

## 2015-12-31 NOTE — Anesthesia Postprocedure Evaluation (Signed)
Anesthesia Post Note  Patient: [name]  Procedure(s) Performed: Procedure(s) (LRB): repair of recurent incarcerated ventral hernia with mesh (N/A)  Patient location during evaluation: PACU Anesthesia Type: General Level of consciousness: awake and alert Pain management: pain level controlled Vital Signs Assessment: post-procedure vital signs reviewed and stable Respiratory status: spontaneous breathing, nonlabored ventilation, respiratory function stable and patient connected to nasal cannula oxygen Cardiovascular status: blood pressure returned to baseline and stable Postop Assessment: no signs of nausea or vomiting Anesthetic complications: no    Last Vitals:  Filed Vitals:   12/20/15 0515 12/20/15 0526  BP: 172/53 155/38  Pulse: 72 76  Temp: 36.9 C   Resp: 20     Last Pain:  Filed Vitals:   12/20/15 0557  PainSc: 0-No pain                 Molli Barrows

## 2016-01-02 ENCOUNTER — Ambulatory Visit (INDEPENDENT_AMBULATORY_CARE_PROVIDER_SITE_OTHER): Payer: Medicare Other | Admitting: Surgery

## 2016-01-02 ENCOUNTER — Encounter: Payer: Self-pay | Admitting: Surgery

## 2016-01-02 VITALS — BP 162/101 | HR 72 | Temp 97.1°F | Ht 63.0 in | Wt 241.0 lb

## 2016-01-02 DIAGNOSIS — K430 Incisional hernia with obstruction, without gangrene: Secondary | ICD-10-CM

## 2016-01-02 DIAGNOSIS — K450 Other specified abdominal hernia with obstruction, without gangrene: Secondary | ICD-10-CM

## 2016-01-02 NOTE — Progress Notes (Signed)
Outpatient postop visit  01/02/2016  [name] is an 82 y.o. female.    Procedure: Repair of recurrent ventral hernia with mesh  CC: Minimal abdominal pain  HPI: This patient who underwent a semiurgent repair of a recurrent ventral hernia with mesh after correction of her INR. She feels well at this point has no problems she has appointments with her primary care physician and has had blood drawn for her INR.  Medications reviewed.    Physical Exam:  There were no vitals taken for this visit.    PE: Morbidly obese, slow-moving but in no acute distress. Abdomen is soft and nontender wounds healing well no erythema no ecchymosis no drainage staples are removed Steri-Strips are placed.    Assessment/Plan:  This a patient with a history of recurrent ventral hernia which was incarcerated and repair with mesh after correction of her INR for chronic anticoagulation Patient doing very well at this point I asked her to come back and see [place] in the office in 2 weeks unless she is perfectly healed and having no problems at which time she can call and cancel that appointment. Her staples are been removed and Steri-Strips of been placed she is doing quite well.   Florene Glen, MD, FACS

## 2016-01-02 NOTE — Care Management (Signed)
Notified by [facility] that they have not been able to follow up with patient for PT  Followed up with Mr. and [name] today , to see if there were any barriers to visits.   Per [name] "[facility] was very pushy and did not understand our request.  It's not that we did not want physical therapy.  However she has had diarrhea and vomiting and was in no condition to participate, but they kept pushing and pushing to come out.  We were planning on having them come out after resolved, but at this point I don't believe I want them coming out to the house"  I offered for services to be reestablished through [facility], also offered to refer to another agency.  After they discussed their options they have decided not pursue home health PT.  They have a followup appointment with their PCP July 13th, and if they feell like they need it will ask for a script then.

## 2016-01-02 NOTE — Patient Instructions (Addendum)
Please call our office with any questions or concerns.  Please do not submerge in a tub, hot tub, or pool until incisions are completely sealed.  Use sun block to incision area over the next year if this area will be exposed to sun. This helps decrease scarring.  You may now resume your normal activities. Listen to your body when lifting, if you have pain when lifting, stop and then try again in a few days.  If you develop redness, drainage, or pain at incision sites- call our office immediately and speak with a nurse.  Please call if you feel like a follow-up is needed with Dr. Burt Knack. You are doing fantastic!

## 2016-01-09 ENCOUNTER — Ambulatory Visit
Admission: RE | Admit: 2016-01-09 | Discharge: 2016-01-09 | Disposition: A | Discharge status: Home / Self Care | Payer: Medicare Other | Source: Ambulatory Visit | Attending: Radiation Oncology | Admitting: Radiation Oncology

## 2016-01-09 ENCOUNTER — Encounter: Payer: Self-pay | Admitting: Radiation Oncology

## 2016-01-09 ENCOUNTER — Inpatient Hospital Stay: Payer: Medicare Other | Attending: Oncology | Admitting: Oncology

## 2016-01-09 VITALS — BP 181/79 | HR 72 | Temp 98.0°F | Resp 20 | Ht 63.0 in | Wt 239.5 lb

## 2016-01-09 DIAGNOSIS — Z79899 Other long term (current) drug therapy: Secondary | ICD-10-CM

## 2016-01-09 DIAGNOSIS — Z7901 Long term (current) use of anticoagulants: Secondary | ICD-10-CM | POA: Diagnosis not present

## 2016-01-09 DIAGNOSIS — M1990 Unspecified osteoarthritis, unspecified site: Secondary | ICD-10-CM | POA: Diagnosis not present

## 2016-01-09 DIAGNOSIS — C50911 Malignant neoplasm of unspecified site of right female breast: Secondary | ICD-10-CM | POA: Insufficient documentation

## 2016-01-09 DIAGNOSIS — Z79810 Long term (current) use of selective estrogen receptor modulators (SERMs): Secondary | ICD-10-CM | POA: Insufficient documentation

## 2016-01-09 DIAGNOSIS — I10 Essential (primary) hypertension: Secondary | ICD-10-CM | POA: Diagnosis not present

## 2016-01-09 DIAGNOSIS — R197 Diarrhea, unspecified: Secondary | ICD-10-CM | POA: Diagnosis not present

## 2016-01-09 DIAGNOSIS — Z7982 Long term (current) use of aspirin: Secondary | ICD-10-CM | POA: Diagnosis not present

## 2016-01-09 DIAGNOSIS — E119 Type 2 diabetes mellitus without complications: Secondary | ICD-10-CM | POA: Insufficient documentation

## 2016-01-09 DIAGNOSIS — Z170 Estrogen receptor positive status [ER+]: Secondary | ICD-10-CM | POA: Insufficient documentation

## 2016-01-09 DIAGNOSIS — R110 Nausea: Secondary | ICD-10-CM | POA: Diagnosis not present

## 2016-01-09 DIAGNOSIS — Z79811 Long term (current) use of aromatase inhibitors: Secondary | ICD-10-CM

## 2016-01-09 DIAGNOSIS — M810 Age-related osteoporosis without current pathological fracture: Secondary | ICD-10-CM

## 2016-01-09 DIAGNOSIS — Z923 Personal history of irradiation: Secondary | ICD-10-CM | POA: Insufficient documentation

## 2016-01-09 DIAGNOSIS — H409 Unspecified glaucoma: Secondary | ICD-10-CM | POA: Insufficient documentation

## 2016-01-09 NOTE — Progress Notes (Signed)
Radiation Oncology Follow up Note  Name: [name]   Date:   01/09/2016 MRN:  [id number] DOB: 12/01/1928    This 82 y.o. female presents to the clinic today for a 2 year follow-up for whole breast radiation to her right breast stage I disease ER/PR positive HER-2/neu negative.  REFERRING PROVIDER: Klein, Bert J III, MD  HPI: Patient is a 82-year-old female now 2 years out having completed whole breast radiation to her right breast for a T1 cN0 invasive mammary carcinoma ER/PR positive HER-2/neu negative status post wide local excision and sentinel node biopsy. She seen today in routine follow-up and is doing fairly well.. She is currently on anastrozole. She's having daily nausea with eating not really sure the etiology of that. She recently had a semiurgent repair of a ventral hernia with mesh and is recovering slowly from that. She specifically denies breast tenderness cough or bone pain. Recent mammogram in February was fine BI-RADS 2 benign  COMPLICATIONS OF TREATMENT: none  FOLLOW UP COMPLIANCE: keeps appointments   PHYSICAL EXAM:  BP 181/79 mmHg  Pulse 72  Temp(Src) 98 F (36.7 C)  Resp 20  Ht 5' 3" (1.6 m)  Wt 239 lb 8.5 oz (108.65 kg)  BMI 42.44 kg/m2 Well-developed morbidly obese female in NAD. Breasts are extremely pendulous. Macro breast exam she does have some thickening in the right lateral breast consistent with radiation change. Well-developed well-nourished patient in NAD. HEENT reveals PERLA, EOMI, discs not visualized.  Oral cavity is clear. No oral mucosal lesions are identified. Neck is clear without evidence of cervical or supraclavicular adenopathy. Lungs are clear to A&P. Cardiac examination is essentially unremarkable with regular rate and rhythm without murmur rub or thrill. Abdomen is benign with no organomegaly or masses noted. Motor sensory and DTR levels are equal and symmetric in the upper and lower extremities. Cranial nerves II through XII are grossly  intact. Proprioception is intact. No peripheral adenopathy or edema is identified. No motor or sensory levels are noted. Crude visual fields are within normal range.  RADIOLOGY RESULTS: Recent mammograms are reviewed  PLAN: Present time from a breast standpoint she is doing well with no evidence of disease. I am please were overall progress. I've asked to see her back in a whole year for follow-up. I've asked her to stop anastrozole for couple weeks to see if that improves her nausea she is on multiple proton pump inhibitors. If that is the etiology of her nausea she may need to switch to another aromatase inhibitor. She will contact medical oncology if that is the case.  I would like to take this opportunity to thank you for allowing me to participate in the care of your patient..    , S., MD   

## 2016-01-09 NOTE — Progress Notes (Signed)
States is feeling nauseated and having loose bowel movements. Dr. Baruch Gouty recommended to stop anastrozole and follow up with Dr. Grayland Ormond. Continues to have hoarseness of voice.

## 2016-01-14 NOTE — Progress Notes (Signed)
[facility]  [phone]  [phone]     [name] DOB: 1928-09-18  MR#: [id number]  STM#:[id number]  Patient Care Team: Adin Hector, MD as PCP - General (Internal Medicine)  CHIEF COMPLAINT: Pathologic stage Ib adenocarcinoma of the right breast. ER/PR positive, HER-2 negative. Chief Complaint  Patient presents with  . Breast Cancer    INTERVAL HISTORY: Patient returns to clinic today for routine 6 month evaluation.  She recently had some increased nausea with loose bowel movements. Her joint pain in her knees has improved since discontinuing letrozole and initiating Arimidex. She otherwise feels well and is asymptomatic. She has no neurologic complaints. She has a good appetite and denies weight loss. She has no chest pain or shortness of breath. She denies any nausea, vomiting, constipation, or diarrhea. She has no urinary complaints. Patient offers no further specific complaints today.  REVIEW OF SYSTEMS:   Review of Systems  Constitutional: Negative.  Negative for fever, weight loss and malaise/fatigue.  HENT: Negative.   Eyes: Negative.   Respiratory: Negative.  Negative for cough and shortness of breath.   Cardiovascular: Negative.  Negative for chest pain.  Gastrointestinal: Negative.  Negative for nausea.  Genitourinary: Negative.   Musculoskeletal: Negative.  Negative for joint pain.  Skin: Negative.   Neurological: Negative.  Negative for weakness.  Endo/Heme/Allergies: Negative.   Psychiatric/Behavioral: Negative.     As per HPI. Otherwise, a complete review of systems is negatve.  ONCOLOGY HISTORY: Oncology History   Adenocarcinoma of right breast [facility])   Staging form: Breast, AJCC 7th Edition     Pathologic stage from 02/09/2013: Stage IB (T1c, N7m, cM0) - Signed by TLemar Livingson 07/10/2015     Clinical stage from 12/14/2014: Stage IIA (T2, N0, M0) - Signed by TLloyd Huger MD on 12/14/2014         [place]   02/09/2013 Pathology Results 1B   06/09/2013 -  Anti-estrogen oral therapy Letrozole    12/14/2014 Initial Diagnosis Adenocarcinoma of right breast ([facility]    PAST MEDICAL HISTORY: Past Medical History  Diagnosis Date  . Arthritis   . Osteoporosis   . Glaucoma   . Hypertension   . Gastric ulcer   . Hiatal hernia   . [place]   . [place]   . [place]    right- radiation    PAST SURGICAL HISTORY: Past Surgical History  Procedure Laterality Date  . Appendectomy    . Tonsillectomy    . Replacement total knee    . Partial colectomy    . Ventral hernia repair N/A 06/04/2015    Procedure:  REPAIR of INCARCERATED VENTRAL HERNIA;  Surgeon: CMarlyce Huge MD;  Location: [facility] ORS;  Service: General;  Laterality: N/A;  . Breast biopsy Right 2014  . Ventral hernia repair N/A 12/18/2015    Procedure: repair of recurent incarcerated ventral hernia with mesh;  Surgeon: RFlorene Glen MD;  Location: [facility] ORS;  Service: General;  Laterality: N/A;    FAMILY HISTORY Family History  Problem Relation Age of Onset  . Diabetes Mother   . Hypertension Mother   . Deep vein thrombosis Mother   . Breast cancer Neg Hx     GYNECOLOGIC HISTORY:  No LMP recorded. Patient is postmenopausal.     ADVANCED DIRECTIVES:    HEALTH MAINTENANCE: Social History  Substance Use Topics  . Smoking  status: Never Smoker   . Smokeless tobacco: Never Used  . Alcohol Use: No    Allergies  Allergen Reactions  . Metformin Diarrhea  . Oxycodone-Acetaminophen Nausea And Vomiting  . Amoxicillin Rash and Other (See Comments)    Has patient had a PCN reaction causing immediate rash, facial/tongue/throat swelling, SOB or lightheadedness with hypotension: No Has patient had a PCN reaction causing severe rash involving mucus membranes or skin necrosis: No Has patient had a PCN  reaction that required hospitalization No Has patient had a PCN reaction occurring within the last 10 years: No If all of the above answers are "NO", then may proceed with Cephalosporin use.    Current Outpatient Prescriptions  Medication Sig Dispense Refill  . allopurinol (ZYLOPRIM) 300 MG tablet Take 150 mg by mouth daily.     Marland Kitchen anastrozole (ARIMIDEX) 1 MG tablet Take 1 tablet (1 mg total) by mouth daily. 90 tablet 1  . ascorbic acid (VITAMIN C) 1000 MG tablet Take 1,000 mg by mouth daily.    Marland Kitchen aspirin EC 81 MG tablet Take 81 mg by mouth every evening.    Marland Kitchen atenolol (TENORMIN) 25 MG tablet Take 25 mg by mouth every evening.     . Calcium-Magnesium-Vitamin D (CALCIUM 1200+D3) 600-40-500 MG-MG-UNIT TB24 Take 1 tablet by mouth 2 (two) times daily.    . cholecalciferol (VITAMIN D) 1000 UNITS tablet Take 1,000 Units by mouth daily.    Marland Kitchen docusate sodium (COLACE) 100 MG capsule Take 100 mg by mouth at bedtime as needed for mild constipation.    Marland Kitchen FOLIC ACID PO Take 1 tablet by mouth every evening.    Marland Kitchen glucose blood (FREESTYLE LITE) test strip     . hydrochlorothiazide (MICROZIDE) 12.5 MG capsule Take 12.5 mg by mouth daily.     Marland Kitchen HYDROcodone-acetaminophen (NORCO/VICODIN) 5-325 MG tablet Take by mouth.    . latanoprost (XALATAN) 0.005 % ophthalmic solution Place 1 drop into both eyes at bedtime.     Marland Kitchen lisinopril (PRINIVIL,ZESTRIL) 30 MG tablet Take 30 mg by mouth 2 (two) times daily.    . Misc Natural Products (OSTEO BI-FLEX ADV DOUBLE ST PO) Take 1 tablet by mouth 2 (two) times daily.    . Multiple Vitamin (MULTIVITAMIN WITH MINERALS) TABS tablet Take 1 tablet by mouth daily.    Marland Kitchen omeprazole (PRILOSEC) 20 MG capsule Take 20 mg by mouth daily. Pt only uses one capsule in the evening if needed.    . pioglitazone (ACTOS) 30 MG tablet Take 30 mg by mouth daily.    . sitaGLIPtin (JANUVIA) 100 MG tablet Take 100 mg by mouth every evening.     . warfarin (COUMADIN) 6 MG tablet every evening. Take as  directed     No current facility-administered medications for this visit.    OBJECTIVE: There were no vitals taken for this visit.   There is no weight on file to calculate BMI.    ECOG FS:0 - Asymptomatic  General: Well-developed, well-nourished, no acute distress. Eyes: Pink conjunctiva, anicteric sclera. Breasts: Patient refused breast exam today. Lungs: Clear to auscultation bilaterally. Heart: Regular rate and rhythm. No rubs, murmurs, or gallops. Abdomen: Soft, nontender, nondistended. No organomegaly noted, normoactive bowel sounds. Musculoskeletal: No edema, cyanosis, or clubbing. Neuro: Alert, answering all questions appropriately. Cranial nerves grossly intact. Skin: No rashes or petechiae noted. Psych: Normal affect.   LAB RESULTS:  No visits with results within 3 Day(s) from this visit. Latest known visit with results is:  Admission on  12/13/2015, Discharged on 12/20/2015  Component Date Value Ref Range Status  . Sodium 12/13/2015 129* 135 - 145 mmol/L Final  . Potassium 12/13/2015 4.2  3.5 - 5.1 mmol/L Final  . Chloride 12/13/2015 94* 101 - 111 mmol/L Final  . CO2 12/13/2015 23  22 - 32 mmol/L Final  . Glucose, Bld 12/13/2015 110* 65 - 99 mg/dL Final  . BUN 12/13/2015 22* 6 - 20 mg/dL Final  . Creatinine, Ser 12/13/2015 1.26* 0.44 - 1.00 mg/dL Final  . Calcium 12/13/2015 9.4  8.9 - 10.3 mg/dL Final  . Total Protein 12/13/2015 7.5  6.5 - 8.1 g/dL Final  . Albumin 12/13/2015 3.9  3.5 - 5.0 g/dL Final  . AST 12/13/2015 33  15 - 41 U/L Final  . ALT 12/13/2015 26  14 - 54 U/L Final  . Alkaline Phosphatase 12/13/2015 68  38 - 126 U/L Final  . Total Bilirubin 12/13/2015 0.3  0.3 - 1.2 mg/dL Final  . GFR calc non Af Amer 12/13/2015 38* >60 mL/min Final  . GFR calc Af Amer 12/13/2015 44* >60 mL/min Final   Comment: (NOTE) The eGFR has been calculated using the CKD EPI equation. This calculation has not been validated in all clinical situations. eGFR's persistently  <60 mL/min signify possible Chronic Kidney Disease.   . Anion gap 12/13/2015 12  5 - 15 Final  . WBC 12/13/2015 12.6* 3.6 - 11.0 K/uL Final  . RBC 12/13/2015 3.89  3.80 - 5.20 MIL/uL Final  . Hemoglobin 12/13/2015 11.9* 12.0 - 16.0 g/dL Final  . HCT 12/13/2015 36.1  35.0 - 47.0 % Final  . MCV 12/13/2015 92.9  80.0 - 100.0 fL Final  . MCH 12/13/2015 30.5  26.0 - 34.0 pg Final  . MCHC 12/13/2015 32.8  32.0 - 36.0 g/dL Final  . RDW 12/13/2015 14.5  11.5 - 14.5 % Final  . Platelets 12/13/2015 262  150 - 440 K/uL Final  . Neutrophils Relative % 12/13/2015 71%   Final  . Neutro Abs 12/13/2015 8.9* 1.4 - 6.5 K/uL Final  . Lymphocytes Relative 12/13/2015 21%   Final  . Lymphs Abs 12/13/2015 2.6  1.0 - 3.6 K/uL Final  . Monocytes Relative 12/13/2015 6%   Final  . Monocytes Absolute 12/13/2015 0.7  0.2 - 0.9 K/uL Final  . Eosinophils Relative 12/13/2015 1%   Final  . Eosinophils Absolute 12/13/2015 0.1  0 - 0.7 K/uL Final  . Basophils Relative 12/13/2015 1%   Final  . Basophils Absolute 12/13/2015 0.2* 0 - 0.1 K/uL Final  . Lipase 12/13/2015 30  11 - 51 U/L Final  . Prothrombin Time 12/13/2015 36.5* 11.4 - 15.0 seconds Final  . INR 12/13/2015 3.79   Final  . ABO/RH(D) 12/13/2015 O POS   Final  . Antibody Screen 12/13/2015 NEG   Final  . Sample Expiration 12/13/2015 12/16/2015   Final  . ABO/RH(D) 12/13/2015 O POS   Final  . Sodium 12/14/2015 133* 135 - 145 mmol/L Final  . Potassium 12/14/2015 4.7  3.5 - 5.1 mmol/L Final  . Chloride 12/14/2015 101  101 - 111 mmol/L Final  . CO2 12/14/2015 24  22 - 32 mmol/L Final  . Glucose, Bld 12/14/2015 124* 65 - 99 mg/dL Final  . BUN 12/14/2015 18  6 - 20 mg/dL Final  . Creatinine, Ser 12/14/2015 1.20* 0.44 - 1.00 mg/dL Final  . Calcium 12/14/2015 8.9  8.9 - 10.3 mg/dL Final  . GFR calc non Af Amer 12/14/2015 40* >60 mL/min Final  .  GFR calc Af Amer 12/14/2015 47* >60 mL/min Final   Comment: (NOTE) The eGFR has been calculated using the CKD EPI  equation. This calculation has not been validated in all clinical situations. eGFR's persistently <60 mL/min signify possible Chronic Kidney Disease.   . Anion gap 12/14/2015 8  5 - 15 Final  . WBC 12/14/2015 8.4  3.6 - 11.0 K/uL Final  . RBC 12/14/2015 3.81  3.80 - 5.20 MIL/uL Final  . Hemoglobin 12/14/2015 11.9* 12.0 - 16.0 g/dL Final  . HCT 12/14/2015 34.7* 35.0 - 47.0 % Final  . MCV 12/14/2015 91.0  80.0 - 100.0 fL Final  . MCH 12/14/2015 31.3  26.0 - 34.0 pg Final  . MCHC 12/14/2015 34.4  32.0 - 36.0 g/dL Final  . RDW 12/14/2015 14.0  11.5 - 14.5 % Final  . Platelets 12/14/2015 249  150 - 440 K/uL Final  . Prothrombin Time 12/14/2015 39.6* 11.4 - 15.0 seconds Final  . INR 12/14/2015 4.23*  Final   Comment: CRITICAL RESULT CALLED TO, READ BACK BY AND VERIFIED WITH: CALLED MARSHA HATCH AT 1638 ON 12/14/15 BY [facility]   . Glucose-Capillary 12/14/2015 113* 65 - 99 mg/dL Final  . Glucose-Capillary 12/14/2015 116* 65 - 99 mg/dL Final  . Glucose-Capillary 12/14/2015 96  65 - 99 mg/dL Final  . Glucose-Capillary 12/14/2015 112* 65 - 99 mg/dL Final  . Prothrombin Time 12/15/2015 38.5* 11.4 - 15.0 seconds Final  . INR 12/15/2015 4.07*  Final   Comment: CRITICAL RESULT CALLED TO, READ BACK BY AND VERIFIED WITH: Jeanette Caprice TORAIN AT 0602 ON 12/15/15 BY [facility]   . Glucose-Capillary 12/14/2015 99  65 - 99 mg/dL Final  . Glucose-Capillary 12/15/2015 99  65 - 99 mg/dL Final  . MRSA by PCR 12/15/2015 NEGATIVE  NEGATIVE Final   Comment:        The GeneXpert MRSA Assay (FDA approved for NASAL specimens only), is one component of a comprehensive MRSA colonization surveillance program. It is not intended to diagnose MRSA infection nor to guide or monitor treatment for MRSA infections.   . Glucose-Capillary 12/15/2015 135* 65 - 99 mg/dL Final  . Glucose-Capillary 12/15/2015 131* 65 - 99 mg/dL Final  . Prothrombin Time 12/16/2015 30.5* 11.4 - 15.0 seconds Final  . INR 12/16/2015 2.99   Final  .  Glucose-Capillary 12/15/2015 139* 65 - 99 mg/dL Final  . Glucose-Capillary 12/16/2015 121* 65 - 99 mg/dL Final  . Comment 1 12/16/2015 Notify RN   Final  . Glucose-Capillary 12/16/2015 153* 65 - 99 mg/dL Final  . Comment 1 12/16/2015 Notify RN   Final  . Glucose-Capillary 12/16/2015 177* 65 - 99 mg/dL Final  . Comment 1 12/16/2015 Notify RN   Final  . Prothrombin Time 12/17/2015 23.6* 11.4 - 15.0 seconds Final  . INR 12/17/2015 2.12   Final  . Glucose-Capillary 12/16/2015 99  65 - 99 mg/dL Final  . Glucose-Capillary 12/17/2015 128* 65 - 99 mg/dL Final  . Comment 1 12/17/2015 Notify RN   Final  . Glucose-Capillary 12/17/2015 175* 65 - 99 mg/dL Final  . Comment 1 12/17/2015 Notify RN   Final  . Glucose-Capillary 12/17/2015 173* 65 - 99 mg/dL Final  . Comment 1 12/17/2015 Notify RN   Final  . Prothrombin Time 12/18/2015 18.7* 11.4 - 15.0 seconds Final  . INR 12/18/2015 1.56   Final  . Sodium 12/18/2015 135  135 - 145 mmol/L Final  . Potassium 12/18/2015 3.7  3.5 - 5.1 mmol/L Final  . Chloride 12/18/2015 102  101 - 111 mmol/L Final  . CO2 12/18/2015 24  22 - 32 mmol/L Final  . Glucose, Bld 12/18/2015 122* 65 - 99 mg/dL Final  . BUN 12/18/2015 16  6 - 20 mg/dL Final  . Creatinine, Ser 12/18/2015 0.89  0.44 - 1.00 mg/dL Final  . Calcium 12/18/2015 8.5* 8.9 - 10.3 mg/dL Final  . GFR calc non Af Amer 12/18/2015 58* >60 mL/min Final  . GFR calc Af Amer 12/18/2015 >60  >60 mL/min Final   Comment: (NOTE) The eGFR has been calculated using the CKD EPI equation. This calculation has not been validated in all clinical situations. eGFR's persistently <60 mL/min signify possible Chronic Kidney Disease.   . Anion gap 12/18/2015 9  5 - 15 Final  . Glucose-Capillary 12/17/2015 121* 65 - 99 mg/dL Final  . Comment 1 12/17/2015 Notify RN   Final  . Glucose-Capillary 12/18/2015 136* 65 - 99 mg/dL Final  . MRSA by PCR 12/18/2015 NEGATIVE  NEGATIVE Final   Comment:        The GeneXpert MRSA Assay  (FDA approved for NASAL specimens only), is one component of a comprehensive MRSA colonization surveillance program. It is not intended to diagnose MRSA infection nor to guide or monitor treatment for MRSA infections.   . Glucose-Capillary 12/18/2015 136* 65 - 99 mg/dL Final  . Glucose-Capillary 12/18/2015 132* 65 - 99 mg/dL Final  . Prothrombin Time 12/19/2015 15.9* 11.4 - 15.0 seconds Final  . INR 12/19/2015 1.26   Final  . Glucose-Capillary 12/18/2015 126* 65 - 99 mg/dL Final  . Glucose-Capillary 12/19/2015 135* 65 - 99 mg/dL Final  . Glucose-Capillary 12/19/2015 156* 65 - 99 mg/dL Final  . Glucose-Capillary 12/19/2015 162* 65 - 99 mg/dL Final  . Prothrombin Time 12/20/2015 15.4* 11.4 - 15.0 seconds Final  . INR 12/20/2015 1.20   Final  . Sodium 12/20/2015 135  135 - 145 mmol/L Final  . Potassium 12/20/2015 3.9  3.5 - 5.1 mmol/L Final  . Chloride 12/20/2015 103  101 - 111 mmol/L Final  . CO2 12/20/2015 22  22 - 32 mmol/L Final  . Glucose, Bld 12/20/2015 133* 65 - 99 mg/dL Final  . BUN 12/20/2015 11  6 - 20 mg/dL Final  . Creatinine, Ser 12/20/2015 0.81  0.44 - 1.00 mg/dL Final  . Calcium 12/20/2015 8.6* 8.9 - 10.3 mg/dL Final  . GFR calc non Af Amer 12/20/2015 >60  >60 mL/min Final  . GFR calc Af Amer 12/20/2015 >60  >60 mL/min Final   Comment: (NOTE) The eGFR has been calculated using the CKD EPI equation. This calculation has not been validated in all clinical situations. eGFR's persistently <60 mL/min signify possible Chronic Kidney Disease.   . Anion gap 12/20/2015 10  5 - 15 Final  . WBC 12/20/2015 10.4  3.6 - 11.0 K/uL Final  . RBC 12/20/2015 3.61* 3.80 - 5.20 MIL/uL Final  . Hemoglobin 12/20/2015 11.3* 12.0 - 16.0 g/dL Final  . HCT 12/20/2015 33.2* 35.0 - 47.0 % Final  . MCV 12/20/2015 92.0  80.0 - 100.0 fL Final  . MCH 12/20/2015 31.3  26.0 - 34.0 pg Final  . MCHC 12/20/2015 34.1  32.0 - 36.0 g/dL Final  . RDW 12/20/2015 14.2  11.5 - 14.5 % Final  . Platelets  12/20/2015 250  150 - 440 K/uL Final  . Glucose-Capillary 12/19/2015 131* 65 - 99 mg/dL Final  . Comment 1 12/19/2015 Notify RN   Final  . Glucose-Capillary 12/20/2015 132* 65 - 99 mg/dL  Final  . Glucose-Capillary 12/20/2015 180* 65 - 99 mg/dL Final    STUDIES: No results found.  ASSESSMENT: Pathologic stage Ib adenocarcinoma of the right breast. ER/PR positive, HER-2 negative.  PLAN:   1. Pathologic stage IB adenocarcinoma the right breast: Given patient's stage of disease she did not require adjuvant chemotherapy. Oncotype DX was been considered, but given her advanced age chemotherapy may be more detrimental. Patient discontinue letrozole secondary to knee pain which has since resolved. Continue anastrozole completing in November 2019. Her recent nausea and diarrhea are unlikely related to her anastrozole. Her most recent mammogram on August 22, 2015 was reported as BI-RADS 2, repeat in one year. Return to clinic in 6 months for routine evaluation. 2. Osteopenia: Patient's most recent bone mineral density on August 12, 2015 reported T score of -1.7. Repeat in one year.   Patient expressed understanding and was in agreement with this plan. She also understands that She can call clinic at any time with any questions, concerns, or complaints.    Lloyd Huger, MD   01/14/2016 12:02 AM

## 2016-04-17 ENCOUNTER — Other Ambulatory Visit: Payer: Self-pay | Admitting: Oncology

## 2016-04-17 DIAGNOSIS — C50911 Malignant neoplasm of unspecified site of right female breast: Secondary | ICD-10-CM

## 2016-04-21 ENCOUNTER — Other Ambulatory Visit: Payer: Self-pay | Admitting: Internal Medicine

## 2016-04-21 DIAGNOSIS — K450 Other specified abdominal hernia with obstruction, without gangrene: Secondary | ICD-10-CM

## 2016-04-21 DIAGNOSIS — R110 Nausea: Secondary | ICD-10-CM

## 2016-05-04 ENCOUNTER — Other Ambulatory Visit: Payer: Self-pay | Admitting: Gastroenterology

## 2016-05-04 DIAGNOSIS — R1013 Epigastric pain: Secondary | ICD-10-CM

## 2016-05-06 ENCOUNTER — Ambulatory Visit: Admission: RE | Admit: 2016-05-06 | Payer: Medicare Other | Source: Ambulatory Visit

## 2016-05-08 ENCOUNTER — Ambulatory Visit: Payer: Medicare Other

## 2016-05-19 DIAGNOSIS — K644 Residual hemorrhoidal skin tags: Secondary | ICD-10-CM | POA: Insufficient documentation

## 2016-05-20 ENCOUNTER — Other Ambulatory Visit: Payer: Self-pay | Admitting: Internal Medicine

## 2016-05-20 DIAGNOSIS — N644 Mastodynia: Secondary | ICD-10-CM

## 2016-06-12 ENCOUNTER — Ambulatory Visit: Payer: Medicare Other

## 2016-06-12 ENCOUNTER — Other Ambulatory Visit: Payer: Medicare Other

## 2016-07-16 ENCOUNTER — Ambulatory Visit: Payer: Medicare Other | Admitting: Oncology

## 2016-11-16 ENCOUNTER — Other Ambulatory Visit: Payer: Self-pay | Admitting: Oncology

## 2016-11-16 DIAGNOSIS — C50911 Malignant neoplasm of unspecified site of right female breast: Secondary | ICD-10-CM

## 2016-11-27 ENCOUNTER — Other Ambulatory Visit: Payer: Self-pay | Admitting: Student

## 2016-11-27 DIAGNOSIS — M899 Disorder of bone, unspecified: Secondary | ICD-10-CM

## 2016-12-10 ENCOUNTER — Ambulatory Visit
Admission: RE | Admit: 2016-12-10 | Discharge: 2016-12-10 | Disposition: A | Discharge status: Home / Self Care | Payer: Medicare Other | Source: Ambulatory Visit | Attending: Student | Admitting: Student

## 2016-12-10 DIAGNOSIS — M7121 Synovial cyst of popliteal space [Baker], right knee: Secondary | ICD-10-CM | POA: Insufficient documentation

## 2016-12-10 DIAGNOSIS — M899 Disorder of bone, unspecified: Secondary | ICD-10-CM | POA: Insufficient documentation

## 2017-01-08 ENCOUNTER — Other Ambulatory Visit: Payer: Self-pay

## 2017-01-08 DIAGNOSIS — C50911 Malignant neoplasm of unspecified site of right female breast: Secondary | ICD-10-CM

## 2017-01-14 ENCOUNTER — Ambulatory Visit: Payer: Medicare Other | Attending: Radiation Oncology | Admitting: Radiation Oncology

## 2017-01-15 ENCOUNTER — Ambulatory Visit: Payer: Medicare Other | Admitting: Radiation Oncology

## 2017-02-22 ENCOUNTER — Inpatient Hospital Stay: Payer: Medicare Other | Admitting: Oncology

## 2017-04-08 ENCOUNTER — Encounter: Payer: Self-pay | Admitting: *Deleted

## 2017-04-15 ENCOUNTER — Encounter: Payer: Self-pay | Admitting: *Deleted

## 2017-04-20 ENCOUNTER — Ambulatory Visit: Payer: Medicare Other | Admitting: General Surgery

## 2017-05-15 ENCOUNTER — Other Ambulatory Visit: Payer: Self-pay | Admitting: Oncology

## 2017-05-15 DIAGNOSIS — C50911 Malignant neoplasm of unspecified site of right female breast: Secondary | ICD-10-CM

## 2017-06-02 ENCOUNTER — Encounter: Payer: Self-pay | Admitting: General Surgery

## 2017-06-15 ENCOUNTER — Ambulatory Visit: Payer: Medicare Other | Admitting: General Surgery

## 2017-07-13 DIAGNOSIS — C4491 Basal cell carcinoma of skin, unspecified: Secondary | ICD-10-CM

## 2017-07-13 HISTORY — DX: Basal cell carcinoma of skin, unspecified: C44.91

## 2017-07-15 ENCOUNTER — Ambulatory Visit: Payer: Medicare Other | Admitting: General Surgery

## 2017-07-29 ENCOUNTER — Encounter: Payer: Self-pay | Admitting: General Surgery

## 2017-07-29 ENCOUNTER — Ambulatory Visit (INDEPENDENT_AMBULATORY_CARE_PROVIDER_SITE_OTHER): Payer: Medicare Other | Admitting: General Surgery

## 2017-07-29 VITALS — BP 128/70 | HR 110 | Resp 20 | Ht 63.0 in | Wt 238.0 lb

## 2017-07-29 DIAGNOSIS — K432 Incisional hernia without obstruction or gangrene: Secondary | ICD-10-CM | POA: Diagnosis not present

## 2017-07-29 NOTE — Patient Instructions (Signed)

## 2017-07-29 NOTE — Progress Notes (Signed)
Patient ID: [name], female   DOB: 02/02/1929, 84 y.o.   MRN: [id number]  Chief Complaint  Patient presents with  . Hernia    HPI [name] is a 84 y.o. female here today for a evaluation of a possible ventral hernia referred by Dr Caryl Comes . She has had a ventral hernia repair done in 2016 and 2017.  Patient noticed this area bulge since her last surgery. She states that it has gotten over the past 6 months. Occasional pain and nausea. She has noticed some dizziness for about 2 weeks. She admits to chills but is completing Cipro for bladder infection. She is in a wheelchair from knee and joint pain. She does admit to short term memory changes. Ambulation is very limited. She is here with her husband, [name] of 28 years.  He was quick to jump in with his assessment.  From his report the hernia has become much more pronounced, the patient reported that she had not shown him at before and he had nothing to compare to.  HPI  Past Medical History:  Diagnosis Date  . Arthritis   . Basal cell carcinoma 2019   LLE  . Breast cancer ([place]) 2014   right- radiation. Dr Pat Patrick  . Diabetes mellitus without complication ([place])   . Gastric ulcer   . Glaucoma   . Hiatal hernia   . Hypertension   . Osteoporosis   . Recurrent deep vein thrombosis (DVT) ([facility])     Past Surgical History:  Procedure Laterality Date  . APPENDECTOMY    . BREAST BIOPSY Right 2014  . COLONOSCOPY  2013  . PARTIAL COLECTOMY     pre cancerous polyps/ over 10 years ago  . REPLACEMENT TOTAL KNEE    . TONSILLECTOMY    . VENTRAL HERNIA REPAIR N/A 06/04/2015   Procedure:  REPAIR of INCARCERATED VENTRAL HERNIA;  Surgeon: Marlyce Huge, MD;  Location: [facility] ORS;  Service: General;  Laterality: N/A;  . VENTRAL HERNIA REPAIR N/A 12/18/2015   Procedure: repair of recurent incarcerated ventral hernia with mesh;  Surgeon: Florene Glen, MD;  Location: [facility] ORS;  Service: General;  Laterality: N/A;    Family History   Problem Relation Age of Onset  . Diabetes Mother   . Hypertension Mother   . Deep vein thrombosis Mother   . Breast cancer Neg Hx     Social History Social History   Tobacco Use  . Smoking status: Never Smoker  . Smokeless tobacco: Never Used  Substance Use Topics  . Alcohol use: No    Alcohol/week: 0.0 oz  . Drug use: No    Allergies  Allergen Reactions  . Metformin Diarrhea  . Oxycodone-Acetaminophen Nausea And Vomiting  . Amoxicillin Rash and Other (See Comments)    Has patient had a PCN reaction causing immediate rash, facial/tongue/throat swelling, SOB or lightheadedness with hypotension: No Has patient had a PCN reaction causing severe rash involving mucus membranes or skin necrosis: No Has patient had a PCN reaction that required hospitalization No Has patient had a PCN reaction occurring within the last 10 years: No If all of the above answers are "NO", then may proceed with Cephalosporin use.    Current Outpatient Medications  Medication Sig Dispense Refill  . allopurinol (ZYLOPRIM) 300 MG tablet Take 150 mg by mouth daily.     Marland Kitchen ascorbic acid (VITAMIN C) 1000 MG tablet Take 1,000 mg by mouth daily.    Marland Kitchen aspirin EC 81 MG tablet  Take 81 mg by mouth every evening.    Marland Kitchen atenolol (TENORMIN) 25 MG tablet Take 25 mg by mouth every evening.     . Calcium-Magnesium-Vitamin D (CALCIUM 1200+D3) 600-40-500 MG-MG-UNIT TB24 Take 1 tablet by mouth 2 (two) times daily.    . cholecalciferol (VITAMIN D) 1000 UNITS tablet Take 1,000 Units by mouth daily.    Marland Kitchen docusate sodium (COLACE) 100 MG capsule Take 100 mg by mouth at bedtime as needed for mild constipation.    Marland Kitchen FOLIC ACID PO Take 1 tablet by mouth every evening.    Marland Kitchen glucose blood (FREESTYLE LITE) test strip     . latanoprost (XALATAN) 0.005 % ophthalmic solution Place 1 drop into both eyes at bedtime.     Marland Kitchen lisinopril (PRINIVIL,ZESTRIL) 30 MG tablet Take 30 mg by mouth 2 (two) times daily.    . Misc Natural Products (OSTEO  BI-FLEX ADV DOUBLE ST PO) Take 1 tablet by mouth 2 (two) times daily.    . Multiple Vitamin (MULTIVITAMIN WITH MINERALS) TABS tablet Take 1 tablet by mouth daily.    Marland Kitchen omeprazole (PRILOSEC) 20 MG capsule Take 20 mg by mouth daily. Pt only uses one capsule in the evening if needed.    . pioglitazone (ACTOS) 30 MG tablet Take 30 mg by mouth daily.    . sitaGLIPtin (JANUVIA) 100 MG tablet Take 100 mg by mouth every evening.     . warfarin (COUMADIN) 6 MG tablet 5 mg every evening. Take as directed    . anastrozole (ARIMIDEX) 1 MG tablet TAKE 1 TABLET DAILY (Patient not taking: Reported on 07/29/2017) 90 tablet 1   No current facility-administered medications for this visit.     Review of Systems Review of Systems  Constitutional: Positive for chills.  Respiratory: Positive for shortness of breath.   Cardiovascular: Negative.     Blood pressure 128/70, pulse (!) 110, resp. rate 20, height 5\' 3"  (1.6 m), weight 238 lb (108 kg), SpO2 98 %.  Physical Exam Physical Exam  Constitutional: She is oriented to person, place, and time. She appears well-developed and well-nourished.  HENT:  Mouth/Throat: Oropharynx is clear and moist.  Eyes: Conjunctivae are normal. No scleral icterus.  Neck: Neck supple.  Cardiovascular: Normal rate, regular rhythm and normal heart sounds.  Pulmonary/Chest: Effort normal and breath sounds normal.  Rash under breast  Abdominal: Soft. There is tenderness. A hernia is present.    Large ventral hernia  Lymphadenopathy:    She has no cervical adenopathy.  Neurological: She is alert and oriented to person, place, and time.  Skin: Skin is warm and dry.  Psychiatric: Her behavior is normal.    Data Reviewed CT scan of December 13, 2015.  Operative reports of June 04, 2015 and December 18, 2015 at which time she underwent initial repair of an incarcerated ventral hernia repaired primarily and that the second episode repair of an incarcerated re-re-recurrent hernia with  a 7.6 cm ventral light mesh.  Laboratory studies of September 2018 showed a hemoglobin A1c of 6.7.  Estimated GFR of 47.  A hemoglobin of 13.3 with a MCV of 93, white blood cell count 12,008 (baseline).  PT/INR of April 07, 2017: 2.7.  In person communication with Ramonita Lab, MD.  Assessment    Recurrent ventral hernia with nonreducible bowel.    Plan    The patient recurred within 8 months of her original procedure and by her husband's report recurred shortly after her June 2017 repair.  Where she  younger, component separation repair would likely be the best option for her.  Her multiple medical comorbidities, and in particular her poor mobility make plans for a recurrent hernia repair difficult.  It took 2 individuals to help her to get from the wheelchair to sit on a low table for a physical examination.  She was able to lay supine during this time without demonstrable shortness of breath.  While reporting mild short-term memory issues, the patient seems overall very "bright" and insightful of her condition.  Her physical limitations based on obesity and her orthopedic issues predominate at this time.  Weight loss is unlikely to occur, and with her orthopedic issues physical conditioning seems to be likely more a dreams and reality.  With 2 previous episodes of incarceration and a nonreducible hernia at this time, I am very concerned that she might have yet another issue while on anticoagulation making timely repair difficult.  It is possible that she could undergo repair with laparoscopic placement of a large mesh to minimize the likelihood of recurrent bowel herniation.  In discussion with Dr. Caryl Comes, the possibility of physical therapy to even slightly improve her endurance might be of benefit.  The patient herself is little interested in elective surgical repair.  Attempts to contact her to discuss options for management today were unsuccessful.  We will have the office staff  contact her next week.  My suspicions are based on our conversation that she will wait until another episode of incarceration.    HPI, Physical Exam, Assessment and Plan have been scribed under the direction and in the presence of Robert Bellow, MD. Karie Fetch, RN  I have completed the exam and reviewed the above documentation for accuracy and completeness.  I agree with the above.  Haematologist has been used and any errors in dictation or transcription are unintentional.  Hervey Ard, M.D., F.A.C.S.  [name] 07/30/2017, 3:37 PM

## 2017-07-30 DIAGNOSIS — K432 Incisional hernia without obstruction or gangrene: Secondary | ICD-10-CM | POA: Insufficient documentation

## 2017-08-03 ENCOUNTER — Telehealth: Payer: Self-pay | Admitting: General Surgery

## 2017-08-03 NOTE — Telephone Encounter (Signed)
I had attempted to touch base with the patient last week, but had been unsuccessful.  The patient and her husband was contacted today by phone regarding my discussion with her PCP.  At present, there is no clear, absolute contraindication to surgical repair based on her multiple medical comorbidities, and her past history of multiple recurrences puts her at risk of the need for another emergency operation.  Her husband reports that her general muscular aches have improved each week off of anastrozole, and this can be anticipated to continue to improve.  They apparently had a very favorable experience with physical therapy around the time of her knee replacement 8 years ago.  Dr. Caryl Comes brought up the possibility of home physical therapy to improve her upper body strength and possibly her pulmonary reserve as well.  The patient's husband was very excited about the idea, the patient herself a little reluctant but amenable to try.  They have an appointment this afternoon with Dr. Caryl Comes and he has been contacted and will take responsibility for making arrangements.  We will see how she does with this and then reassess her for potential elective hernia repair.

## 2017-08-12 DIAGNOSIS — L89151 Pressure ulcer of sacral region, stage 1: Secondary | ICD-10-CM | POA: Insufficient documentation

## 2017-08-29 ENCOUNTER — Ambulatory Visit
Admission: EM | Admit: 2017-08-29 | Discharge: 2017-08-29 | Disposition: A | Discharge status: Home / Self Care | Payer: Medicare Other | Attending: Family Medicine | Admitting: Family Medicine

## 2017-08-29 ENCOUNTER — Encounter: Payer: Self-pay | Admitting: Gynecology

## 2017-08-29 ENCOUNTER — Other Ambulatory Visit: Payer: Self-pay

## 2017-08-29 DIAGNOSIS — Z79899 Other long term (current) drug therapy: Secondary | ICD-10-CM | POA: Insufficient documentation

## 2017-08-29 DIAGNOSIS — M1990 Unspecified osteoarthritis, unspecified site: Secondary | ICD-10-CM | POA: Insufficient documentation

## 2017-08-29 DIAGNOSIS — Z87440 Personal history of urinary (tract) infections: Secondary | ICD-10-CM | POA: Diagnosis present

## 2017-08-29 DIAGNOSIS — R109 Unspecified abdominal pain: Secondary | ICD-10-CM | POA: Diagnosis not present

## 2017-08-29 DIAGNOSIS — I10 Essential (primary) hypertension: Secondary | ICD-10-CM | POA: Insufficient documentation

## 2017-08-29 DIAGNOSIS — K219 Gastro-esophageal reflux disease without esophagitis: Secondary | ICD-10-CM | POA: Insufficient documentation

## 2017-08-29 DIAGNOSIS — H42 Glaucoma in diseases classified elsewhere: Secondary | ICD-10-CM | POA: Diagnosis not present

## 2017-08-29 DIAGNOSIS — E1139 Type 2 diabetes mellitus with other diabetic ophthalmic complication: Secondary | ICD-10-CM | POA: Diagnosis not present

## 2017-08-29 DIAGNOSIS — H409 Unspecified glaucoma: Secondary | ICD-10-CM | POA: Diagnosis not present

## 2017-08-29 DIAGNOSIS — R350 Frequency of micturition: Secondary | ICD-10-CM | POA: Diagnosis not present

## 2017-08-29 DIAGNOSIS — Z853 Personal history of malignant neoplasm of breast: Secondary | ICD-10-CM | POA: Diagnosis not present

## 2017-08-29 DIAGNOSIS — E669 Obesity, unspecified: Secondary | ICD-10-CM | POA: Diagnosis not present

## 2017-08-29 DIAGNOSIS — Z7901 Long term (current) use of anticoagulants: Secondary | ICD-10-CM | POA: Insufficient documentation

## 2017-08-29 DIAGNOSIS — Z6841 Body Mass Index (BMI) 40.0 and over, adult: Secondary | ICD-10-CM | POA: Diagnosis not present

## 2017-08-29 DIAGNOSIS — Z9049 Acquired absence of other specified parts of digestive tract: Secondary | ICD-10-CM | POA: Insufficient documentation

## 2017-08-29 DIAGNOSIS — Z86718 Personal history of other venous thrombosis and embolism: Secondary | ICD-10-CM | POA: Diagnosis not present

## 2017-08-29 DIAGNOSIS — R300 Dysuria: Secondary | ICD-10-CM

## 2017-08-29 DIAGNOSIS — Z85828 Personal history of other malignant neoplasm of skin: Secondary | ICD-10-CM | POA: Insufficient documentation

## 2017-08-29 DIAGNOSIS — Z880 Allergy status to penicillin: Secondary | ICD-10-CM | POA: Insufficient documentation

## 2017-08-29 DIAGNOSIS — Z7982 Long term (current) use of aspirin: Secondary | ICD-10-CM | POA: Diagnosis not present

## 2017-08-29 LAB — URINALYSIS, COMPLETE (UACMP) WITH MICROSCOPIC
BILIRUBIN URINE: NEGATIVE
Glucose, UA: NEGATIVE mg/dL
HGB URINE DIPSTICK: NEGATIVE
NITRITE: NEGATIVE
PROTEIN: 100 mg/dL — AB
Specific Gravity, Urine: 1.02 (ref 1.005–1.030)
pH: 5 (ref 5.0–8.0)

## 2017-08-29 NOTE — ED Provider Notes (Signed)
[facility]    CSN: [id number] Arrival date & time: 08/29/17  1432  History   Chief Complaint Chief Complaint  Patient presents with  . Recurrent UTI   HPI  84 year old female presents with concerns for UTI.  Patient and husband state that she has had ongoing burning and suprapubic pain as well as urinary frequency for the past month.  Husband states that she has had several courses of antibiotic.  She recently gave a urine specimen and they have not received the culture results.  Patient continues to have urinary frequency, dysuria, suprapubic pain.  She has completed the antibiotics as prescribed.  No associated fever.  No chills.  No other associated symptoms.  No other complaints at this time.  Past Medical History:  Diagnosis Date  . Arthritis   . Basal cell carcinoma 2019   LLE  . Breast cancer ([place]) 2014   right- radiation. Dr Pat Patrick  . Diabetes mellitus without complication ([place])   . Gastric ulcer   . Glaucoma   . Hiatal hernia   . Hypertension   . Osteoporosis   . Recurrent deep vein thrombosis (DVT) [facility])     Patient Active Problem List   Diagnosis Date Noted  . Recurrent ventral hernia with incarceration 12/13/2015  . Obesity (BMI 35.0-39.9 without comorbidity)   . Essential (primary) hypertension 12/04/2015  . Body mass index (BMI) of 40.0-44.9 in adult ([place]) 11/27/2015  . Aftercare following surgery 06/20/2015  . Type 2 diabetes mellitus ([place]) 06/18/2015  . Deep vein thrombosis (DVT) of lower extremity ([place]) 06/18/2015  . Acid reflux 06/18/2015  . Arthritis urica 06/18/2015  . BP (high blood pressure) 06/18/2015  . Arthritis, degenerative 06/18/2015  . Osteoporosis, post-menopausal 06/18/2015  . Incarcerated ventral hernia 06/04/2015  . Umbilical hernia, incarcerated   . Adenocarcinoma of right breast ([place]) 12/14/2014  . Malignant neoplasm of female breast ([place]) 12/14/2014  . Carcinoma of breast ([place]) 12/14/2014  . Idiopathic localized  osteoarthropathy 09/25/2011    Past Surgical History:  Procedure Laterality Date  . APPENDECTOMY    . BREAST BIOPSY Right 2014  . COLONOSCOPY  2013  . PARTIAL COLECTOMY     pre cancerous polyps/ over 10 years ago  . REPLACEMENT TOTAL KNEE    . TONSILLECTOMY    . VENTRAL HERNIA REPAIR N/A 06/04/2015   Procedure:  REPAIR of INCARCERATED VENTRAL HERNIA;  Surgeon: Marlyce Huge, MD;  Location: [facility] ORS;  Service: General;  Laterality: N/A;  . VENTRAL HERNIA REPAIR N/A 12/18/2015   Procedure: repair of recurent incarcerated ventral hernia with mesh;  Surgeon: Florene Glen, MD;  Location: [facility] ORS;  Service: General;  Laterality: N/A;    OB History    No data available       Home Medications    Prior to Admission medications   Medication Sig Start Date End Date Taking? Authorizing Provider  allopurinol (ZYLOPRIM) 300 MG tablet Take 150 mg by mouth daily.    Yes [provider]  anastrozole (ARIMIDEX) 1 MG tablet TAKE 1 TABLET DAILY 11/16/16  Yes Lloyd Huger, MD  ascorbic acid (VITAMIN C) 1000 MG tablet Take 1,000 mg by mouth daily.   Yes [provider]  aspirin EC 81 MG tablet Take 81 mg by mouth every evening.   Yes [provider]  atenolol (TENORMIN) 25 MG tablet Take 25 mg by mouth every evening.    Yes [provider]  Calcium-Magnesium-Vitamin D (CALCIUM 1200+D3) 600-40-500 MG-MG-UNIT TB24 Take 1  tablet by mouth 2 (two) times daily.   Yes [provider]  cholecalciferol (VITAMIN D) 1000 UNITS tablet Take 1,000 Units by mouth daily.   Yes [provider]  docusate sodium (COLACE) 100 MG capsule Take 100 mg by mouth at bedtime as needed for mild constipation.   Yes [provider]  FOLIC ACID PO Take 1 tablet by mouth every evening.   Yes [provider]  glucose blood (FREESTYLE LITE) test strip  12/10/15  Yes [provider]  latanoprost (XALATAN) 0.005 % ophthalmic solution Place 1  drop into both eyes at bedtime.    Yes [provider]  lisinopril (PRINIVIL,ZESTRIL) 30 MG tablet Take 30 mg by mouth 2 (two) times daily.   Yes [provider]  Misc Natural Products (OSTEO BI-FLEX ADV DOUBLE ST PO) Take 1 tablet by mouth 2 (two) times daily.   Yes [provider]  Multiple Vitamin (MULTIVITAMIN WITH MINERALS) TABS tablet Take 1 tablet by mouth daily.   Yes [provider]  omeprazole (PRILOSEC) 20 MG capsule Take 20 mg by mouth daily. Pt only uses one capsule in the evening if needed.   Yes [provider]  pioglitazone (ACTOS) 30 MG tablet Take 30 mg by mouth daily.   Yes [provider]  sitaGLIPtin (JANUVIA) 100 MG tablet Take 100 mg by mouth every evening.    Yes [provider]  warfarin (COUMADIN) 6 MG tablet 5 mg every evening. Take as directed   Yes [provider]    Family History Family History  Problem Relation Age of Onset  . Diabetes Mother   . Hypertension Mother   . Deep vein thrombosis Mother   . Breast cancer Neg Hx     Social History Social History   Tobacco Use  . Smoking status: Never Smoker  . Smokeless tobacco: Never Used  Substance Use Topics  . Alcohol use: No    Alcohol/week: 0.0 oz  . Drug use: No     Allergies   Metformin; Oxycodone-acetaminophen; and Amoxicillin   Review of Systems Review of Systems  Gastrointestinal:       Suprapubic pain.  Genitourinary: Positive for dysuria and frequency.   Physical Exam Triage Vital Signs ED Triage Vitals  Enc Vitals Group     BP 08/29/17 1542 (!) 176/61     Pulse Rate 08/29/17 1542 78     Resp 08/29/17 1542 16     Temp 08/29/17 1542 (!) 97.4 F (36.3 C)     Temp Source 08/29/17 1542 Oral     SpO2 08/29/17 1542 100 %     Weight 08/29/17 1556 236 lb (107 kg)     Height 08/29/17 1556 5\' 3"  (1.6 m)     Head Circumference --      Peak Flow --      Pain Score 08/29/17 1539 5     Pain Loc --      Pain Edu?  --      Excl. in [place]? --    Updated Vital Signs BP (!) 176/61 (BP Location: Left Arm)   Pulse 78   Temp (!) 97.4 F (36.3 C) (Oral)   Resp 16   Ht 5\' 3"  (1.6 m)   Wt 236 lb (107 kg)   SpO2 100%   BMI 41.81 kg/m    Physical Exam  Constitutional: She appears well-developed and well-nourished. No distress.  Cardiovascular: Normal rate and regular rhythm.  Pulmonary/Chest: Effort normal and breath  sounds normal.  Abdominal: Soft.  Large ventral hernia noted.  Patient endorsing suprapubic tenderness.  Neurological: She is alert.  Skin: Skin is warm. No rash noted.  Psychiatric: She has a normal mood and affect.  Nursing note and vitals reviewed.  UC Treatments / Results  Labs (all labs ordered are listed, but only abnormal results are displayed) Labs Reviewed  URINALYSIS, COMPLETE (UACMP) WITH MICROSCOPIC - Abnormal; Notable for the following components:      Result Value   Ketones, ur TRACE (*)    Protein, ur 100 (*)    Leukocytes, UA TRACE (*)    Squamous Epithelial / LPF 6-30 (*)    Bacteria, UA FEW (*)    All other components within normal limits  URINE CULTURE    EKG  EKG Interpretation None       Radiology No results found.  Procedures Procedures (including critical care time)  Medications Ordered in UC Medications - No data to display   Initial Impression / Assessment and Plan / UC Course  I have reviewed the triage vital signs and the nursing notes.  Pertinent labs & imaging results that were available during my care of the patient were reviewed by me and considered in my medical decision making (see chart for details).     84 year old female presents with urinary tract symptoms.  I reviewed the most recent culture which was resulted as of 2/17.  Culture was negative.  Sending culture today.  Advised against additional antibiotic therapy at this time.  Follow-up with primary care to discuss allergy referral if symptoms persist.  Final Clinical  Impressions(s) / UC Diagnoses   Final diagnoses:  Dysuria    ED Discharge Orders    None     Controlled Substance Prescriptions [place] Controlled Substance Registry consulted? Not Applicable   Coral Spikes, DO 08/29/17 1654

## 2017-08-29 NOTE — Discharge Instructions (Signed)
Your most recent culture was negative.  No need for additional antibiotic at this time.  Call Dr. Caryl Comes in the AM.  Take care  Dr. Lacinda Axon

## 2017-08-29 NOTE — ED Triage Notes (Signed)
Per husband wife with recurrent bladder infection. Patient c/o burning and painful urination.

## 2017-08-31 LAB — URINE CULTURE

## 2017-09-10 ENCOUNTER — Ambulatory Visit: Payer: Medicare Other | Admitting: Urology

## 2017-09-22 NOTE — Progress Notes (Deleted)
09/23/2017 9:29 PM   [name] Apr 22, 1929 [id number]  Referring provider: Adin Hector, MD [facility] [place], [place] [address]  No chief complaint on file.   HPI: Patient is a *** -year-old *** female who is referred to [place] by, ***, for recurrent urinary tract infections.  Patient states that she has had *** urinary tract infections over the last year.  Reviewing her records,  she has had *** .    Her symptoms with a urinary tract infection consist of ***.  She denies/endorses dysuria, gross hematuria, suprapubic pain, back pain, abdominal pain or flank pain associated with UTI's.    She has not had any recent fevers, chills, nausea or vomiting associated with UTI's.   She does/does not have a history of nephrolithiasis, GU surgery or GU trauma. ***  She is/is not sexually active.  She has/has not noted a correlation with her urinary tract infections and sexual intercourse.  ***   She does/does not engage in anal sex. ***  She is/ is not having anal to vaginal sex.*** She is/is not voiding before and after sex. ***     She is/is not postmenopausal. ***  She admits to/denies constipation and/or diarrhea. ***  She does/does not use tampons.  She does/does not engage in good perineal hygiene. She does/does not take tub baths. ***  She has/does not have incontinence.  She is using incontinence pads. ***  She is having/ not having pain with bladder filling.  ***  She has/not had any recent imaging studies.  ***  She is drinking *** of water daily.     Reviewed referral notes.    PMH: Past Medical History:  Diagnosis Date  . Arthritis   . Basal cell carcinoma 2019   LLE  . Breast cancer ([place]) 2014   right- radiation. Dr Pat Patrick  . Diabetes mellitus without complication ([place])   . Gastric ulcer   . Glaucoma   . Hiatal hernia   . Hypertension   . Osteoporosis   . Recurrent deep vein thrombosis (DVT) [facility])     Surgical  History: Past Surgical History:  Procedure Laterality Date  . APPENDECTOMY    . BREAST BIOPSY Right 2014  . COLONOSCOPY  2013  . PARTIAL COLECTOMY     pre cancerous polyps/ over 10 years ago  . REPLACEMENT TOTAL KNEE    . TONSILLECTOMY    . VENTRAL HERNIA REPAIR N/A 06/04/2015   Procedure:  REPAIR of INCARCERATED VENTRAL HERNIA;  Surgeon: Marlyce Huge, MD;  Location: [facility] ORS;  Service: General;  Laterality: N/A;  . VENTRAL HERNIA REPAIR N/A 12/18/2015   Procedure: repair of recurent incarcerated ventral hernia with mesh;  Surgeon: Florene Glen, MD;  Location: [facility] ORS;  Service: General;  Laterality: N/A;    Home Medications:  Allergies as of 09/23/2017      Reactions   Metformin Diarrhea   Oxycodone-acetaminophen Nausea And Vomiting   Amoxicillin Rash, Other (See Comments)   Has patient had a PCN reaction causing immediate rash, facial/tongue/throat swelling, SOB or lightheadedness with hypotension: No Has patient had a PCN reaction causing severe rash involving mucus membranes or skin necrosis: No Has patient had a PCN reaction that required hospitalization No Has patient had a PCN reaction occurring within the last 10 years: No If all of the above answers are "NO", then may proceed with Cephalosporin use.      Medication List  Accurate as of 09/22/17  9:29 PM. Always use your most recent med list.          allopurinol 300 MG tablet Commonly known as:  ZYLOPRIM Take 150 mg by mouth daily.   anastrozole 1 MG tablet Commonly known as:  ARIMIDEX TAKE 1 TABLET DAILY   ascorbic acid 1000 MG tablet Commonly known as:  VITAMIN C Take 1,000 mg by mouth daily.   aspirin EC 81 MG tablet Take 81 mg by mouth every evening.   atenolol 25 MG tablet Commonly known as:  TENORMIN Take 25 mg by mouth every evening.   CALCIUM 1200+D3 600-40-500 MG-MG-UNIT Tb24 Generic drug:  Calcium-Magnesium-Vitamin D Take 1 tablet by mouth 2 (two) times daily.     cholecalciferol 1000 units tablet Commonly known as:  VITAMIN D Take 1,000 Units by mouth daily.   docusate sodium 100 MG capsule Commonly known as:  COLACE Take 100 mg by mouth at bedtime as needed for mild constipation.   FOLIC ACID PO Take 1 tablet by mouth every evening.   FREESTYLE LITE test strip Generic drug:  glucose blood   latanoprost 0.005 % ophthalmic solution Commonly known as:  XALATAN Place 1 drop into both eyes at bedtime.   lisinopril 30 MG tablet Commonly known as:  PRINIVIL,ZESTRIL Take 30 mg by mouth 2 (two) times daily.   multivitamin with minerals Tabs tablet Take 1 tablet by mouth daily.   omeprazole 20 MG capsule Commonly known as:  PRILOSEC Take 20 mg by mouth daily. Pt only uses one capsule in the evening if needed.   OSTEO BI-FLEX ADV DOUBLE ST PO Take 1 tablet by mouth 2 (two) times daily.   pioglitazone 30 MG tablet Commonly known as:  ACTOS Take 30 mg by mouth daily.   sitaGLIPtin 100 MG tablet Commonly known as:  JANUVIA Take 100 mg by mouth every evening.   warfarin 6 MG tablet Commonly known as:  COUMADIN 5 mg every evening. Take as directed       Allergies:  Allergies  Allergen Reactions  . Metformin Diarrhea  . Oxycodone-Acetaminophen Nausea And Vomiting  . Amoxicillin Rash and Other (See Comments)    Has patient had a PCN reaction causing immediate rash, facial/tongue/throat swelling, SOB or lightheadedness with hypotension: No Has patient had a PCN reaction causing severe rash involving mucus membranes or skin necrosis: No Has patient had a PCN reaction that required hospitalization No Has patient had a PCN reaction occurring within the last 10 years: No If all of the above answers are "NO", then may proceed with Cephalosporin use.    Family History: Family History  Problem Relation Age of Onset  . Diabetes Mother   . Hypertension Mother   . Deep vein thrombosis Mother   . Breast cancer Neg Hx     Social  History:  reports that  has never smoked. she has never used smokeless tobacco. She reports that she does not drink alcohol or use drugs.  ROS:                                        Physical Exam: There were no vitals taken for this visit.  Constitutional: Well nourished. Alert and oriented, No acute distress. HEENT: [place] AT, moist mucus membranes. Trachea midline, no masses. Cardiovascular: No clubbing, cyanosis, or edema. Respiratory: Normal respiratory effort, no increased work of breathing. GI: Abdomen  is soft, non tender, non distended, no abdominal masses. Liver and spleen not palpable.  No hernias appreciated.  Stool sample for occult testing is not indicated.   GU: No CVA tenderness.  No bladder fullness or masses.  Normal external genitalia, normal pubic hair distribution, no lesions.  Normal urethral meatus, no lesions, no prolapse, no discharge.   No urethral masses, tenderness and/or tenderness. No bladder fullness, tenderness or masses. Normal vagina mucosa, good estrogen effect, no discharge, no lesions, good pelvic support, no cystocele or rectocele noted.  No cervical motion tenderness.  Uterus is freely mobile and non-fixed.  No adnexal/parametria masses or tenderness noted.  Anus and perineum are without rashes or lesions.   *** Skin: No rashes, bruises or suspicious lesions. Lymph: No cervical or inguinal adenopathy. Neurologic: Grossly intact, no focal deficits, moving all 4 extremities. Psychiatric: Normal mood and affect.  Laboratory Data: Lab Results  Component Value Date   WBC 10.4 12/20/2015   HGB 11.3 (L) 12/20/2015   HCT 33.2 (L) 12/20/2015   MCV 92.0 12/20/2015   PLT 250 12/20/2015    Lab Results  Component Value Date   CREATININE 0.81 12/20/2015    No results found for: PSA  No results found for: TESTOSTERONE  No results found for: HGBA1C  No results found for: TSH  No results found for: CHOL, HDL, CHOLHDL, VLDL,  LDLCALC  Lab Results  Component Value Date   AST 33 12/13/2015   Lab Results  Component Value Date   ALT 26 12/13/2015   No components found for: ALKALINEPHOPHATASE No components found for: BILIRUBINTOTAL  No results found for: ESTRADIOL  Urinalysis    Component Value Date/Time   COLORURINE YELLOW 08/29/2017 [place] 08/29/2017 1535   LABSPEC 1.020 08/29/2017 1535   PHURINE 5.0 08/29/2017 1535   GLUCOSEU NEGATIVE 08/29/2017 1535   HGBUR NEGATIVE 08/29/2017 1535   BILIRUBINUR NEGATIVE 08/29/2017 1535   KETONESUR TRACE (A) 08/29/2017 1535   PROTEINUR 100 (A) 08/29/2017 1535   NITRITE NEGATIVE 08/29/2017 1535   LEUKOCYTESUR TRACE (A) 08/29/2017 1535    I have reviewed the labs.   Pertinent Imaging: *** I have independently reviewed the films.    Assessment & Plan:  ***  There are no diagnoses linked to this encounter.  No Follow-up on file.  These notes generated with voice recognition software. I apologize for typographical errors.  Zara Council, [facility] [address], [place], [place] [address] [phone]

## 2017-09-23 ENCOUNTER — Ambulatory Visit: Payer: Medicare Other | Admitting: Urology

## 2017-10-21 ENCOUNTER — Encounter: Payer: Self-pay | Admitting: Emergency Medicine

## 2017-10-21 ENCOUNTER — Emergency Department: Payer: Medicare Other

## 2017-10-21 ENCOUNTER — Inpatient Hospital Stay
Admission: EM | Admit: 2017-10-21 | Discharge: 2017-10-24 | DRG: 813 | Disposition: A | Discharge status: Home Health | Payer: Medicare Other | Attending: Internal Medicine | Admitting: Internal Medicine

## 2017-10-21 ENCOUNTER — Other Ambulatory Visit: Payer: Self-pay

## 2017-10-21 DIAGNOSIS — K432 Incisional hernia without obstruction or gangrene: Secondary | ICD-10-CM | POA: Diagnosis present

## 2017-10-21 DIAGNOSIS — R791 Abnormal coagulation profile: Secondary | ICD-10-CM

## 2017-10-21 DIAGNOSIS — Z853 Personal history of malignant neoplasm of breast: Secondary | ICD-10-CM

## 2017-10-21 DIAGNOSIS — Z86718 Personal history of other venous thrombosis and embolism: Secondary | ICD-10-CM

## 2017-10-21 DIAGNOSIS — M810 Age-related osteoporosis without current pathological fracture: Secondary | ICD-10-CM | POA: Diagnosis present

## 2017-10-21 DIAGNOSIS — Z85828 Personal history of other malignant neoplasm of skin: Secondary | ICD-10-CM

## 2017-10-21 DIAGNOSIS — Z833 Family history of diabetes mellitus: Secondary | ICD-10-CM

## 2017-10-21 DIAGNOSIS — D684 Acquired coagulation factor deficiency: Principal | ICD-10-CM | POA: Diagnosis present

## 2017-10-21 DIAGNOSIS — D1621 Benign neoplasm of long bones of right lower limb: Secondary | ICD-10-CM | POA: Diagnosis present

## 2017-10-21 DIAGNOSIS — K567 Ileus, unspecified: Secondary | ICD-10-CM

## 2017-10-21 DIAGNOSIS — Z96652 Presence of left artificial knee joint: Secondary | ICD-10-CM | POA: Diagnosis present

## 2017-10-21 DIAGNOSIS — Z7982 Long term (current) use of aspirin: Secondary | ICD-10-CM

## 2017-10-21 DIAGNOSIS — E119 Type 2 diabetes mellitus without complications: Secondary | ICD-10-CM | POA: Diagnosis present

## 2017-10-21 DIAGNOSIS — Z7901 Long term (current) use of anticoagulants: Secondary | ICD-10-CM

## 2017-10-21 DIAGNOSIS — D689 Coagulation defect, unspecified: Secondary | ICD-10-CM | POA: Diagnosis present

## 2017-10-21 DIAGNOSIS — Z8249 Family history of ischemic heart disease and other diseases of the circulatory system: Secondary | ICD-10-CM

## 2017-10-21 DIAGNOSIS — G8929 Other chronic pain: Secondary | ICD-10-CM | POA: Diagnosis present

## 2017-10-21 DIAGNOSIS — K449 Diaphragmatic hernia without obstruction or gangrene: Secondary | ICD-10-CM | POA: Diagnosis present

## 2017-10-21 DIAGNOSIS — M1711 Unilateral primary osteoarthritis, right knee: Secondary | ICD-10-CM | POA: Diagnosis present

## 2017-10-21 DIAGNOSIS — Z6841 Body Mass Index (BMI) 40.0 and over, adult: Secondary | ICD-10-CM

## 2017-10-21 DIAGNOSIS — K649 Unspecified hemorrhoids: Secondary | ICD-10-CM | POA: Diagnosis present

## 2017-10-21 DIAGNOSIS — Z79818 Long term (current) use of other agents affecting estrogen receptors and estrogen levels: Secondary | ICD-10-CM

## 2017-10-21 DIAGNOSIS — M25562 Pain in left knee: Secondary | ICD-10-CM | POA: Diagnosis present

## 2017-10-21 DIAGNOSIS — B354 Tinea corporis: Secondary | ICD-10-CM

## 2017-10-21 DIAGNOSIS — B369 Superficial mycosis, unspecified: Secondary | ICD-10-CM | POA: Diagnosis present

## 2017-10-21 DIAGNOSIS — K219 Gastro-esophageal reflux disease without esophagitis: Secondary | ICD-10-CM | POA: Diagnosis present

## 2017-10-21 DIAGNOSIS — I10 Essential (primary) hypertension: Secondary | ICD-10-CM | POA: Diagnosis present

## 2017-10-21 DIAGNOSIS — H409 Unspecified glaucoma: Secondary | ICD-10-CM | POA: Diagnosis present

## 2017-10-21 DIAGNOSIS — Z880 Allergy status to penicillin: Secondary | ICD-10-CM

## 2017-10-21 DIAGNOSIS — K9189 Other postprocedural complications and disorders of digestive system: Secondary | ICD-10-CM

## 2017-10-21 DIAGNOSIS — Z7984 Long term (current) use of oral hypoglycemic drugs: Secondary | ICD-10-CM

## 2017-10-21 DIAGNOSIS — Z8711 Personal history of peptic ulcer disease: Secondary | ICD-10-CM

## 2017-10-21 DIAGNOSIS — M25561 Pain in right knee: Secondary | ICD-10-CM

## 2017-10-21 LAB — COMPREHENSIVE METABOLIC PANEL
ALBUMIN: 3.5 g/dL (ref 3.5–5.0)
ALT: 19 U/L (ref 14–54)
ANION GAP: 8 (ref 5–15)
AST: 26 U/L (ref 15–41)
Alkaline Phosphatase: 90 U/L (ref 38–126)
BILIRUBIN TOTAL: 0.5 mg/dL (ref 0.3–1.2)
BUN: 40 mg/dL — ABNORMAL HIGH (ref 6–20)
CO2: 23 mmol/L (ref 22–32)
Calcium: 9.2 mg/dL (ref 8.9–10.3)
Chloride: 105 mmol/L (ref 101–111)
Creatinine, Ser: 1.28 mg/dL — ABNORMAL HIGH (ref 0.44–1.00)
GFR calc non Af Amer: 37 mL/min — ABNORMAL LOW (ref >60)
GFR, EST AFRICAN AMERICAN: 43 mL/min — AB (ref >60)
GLUCOSE: 161 mg/dL — AB (ref 65–99)
POTASSIUM: 4.2 mmol/L (ref 3.5–5.1)
SODIUM: 136 mmol/L (ref 135–145)
TOTAL PROTEIN: 7.3 g/dL (ref 6.5–8.1)

## 2017-10-21 LAB — CBC WITH DIFFERENTIAL/PLATELET
BASOS ABS: 0.1 10*3/uL (ref 0–0.1)
Basophils Relative: 1 %
EOS ABS: 0.2 10*3/uL (ref 0–0.7)
EOS PCT: 2 %
HCT: 34.5 % — ABNORMAL LOW (ref 35.0–47.0)
Hemoglobin: 11.5 g/dL — ABNORMAL LOW (ref 12.0–16.0)
Lymphocytes Relative: 15 %
Lymphs Abs: 2 10*3/uL (ref 1.0–3.6)
MCH: 31.7 pg (ref 26.0–34.0)
MCHC: 33.3 g/dL (ref 32.0–36.0)
MCV: 95.2 fL (ref 80.0–100.0)
Monocytes Absolute: 0.9 10*3/uL (ref 0.2–0.9)
Monocytes Relative: 7 %
Neutro Abs: 10 10*3/uL — ABNORMAL HIGH (ref 1.4–6.5)
Neutrophils Relative %: 75 %
PLATELETS: 356 10*3/uL (ref 150–440)
RBC: 3.62 MIL/uL — AB (ref 3.80–5.20)
RDW: 15 % — ABNORMAL HIGH (ref 11.5–14.5)
WBC: 13.2 10*3/uL — ABNORMAL HIGH (ref 3.6–11.0)

## 2017-10-21 LAB — PROTIME-INR
INR: 6.86
PROTHROMBIN TIME: 59 s — AB (ref 11.4–15.2)

## 2017-10-21 MED ORDER — VITAMIN D 1000 UNITS PO TABS
1000.0000 [IU] | ORAL_TABLET | Freq: Every day | ORAL | Status: DC
  Administered 2017-10-22 – 2017-10-24 (×3): 1000 [IU] via ORAL
  Filled 2017-10-21 (×3): qty 1

## 2017-10-21 MED ORDER — ONDANSETRON HCL 4 MG PO TABS: 4.0000 mg | ORAL_TABLET | Freq: Four times a day (QID) | ORAL | Status: DC | PRN

## 2017-10-21 MED ORDER — ACETAMINOPHEN 650 MG RE SUPP: 650.0000 mg | Freq: Four times a day (QID) | RECTAL | Status: DC | PRN

## 2017-10-21 MED ORDER — ALLOPURINOL 100 MG PO TABS
100.0000 mg | ORAL_TABLET | Freq: Every day | ORAL | Status: DC
  Administered 2017-10-22 – 2017-10-24 (×3): 100 mg via ORAL
  Filled 2017-10-21 (×3): qty 1

## 2017-10-21 MED ORDER — FOLIC ACID 1 MG PO TABS
1.0000 mg | ORAL_TABLET | Freq: Every evening | ORAL | Status: DC
  Administered 2017-10-22 – 2017-10-24 (×3): 1 mg via ORAL
  Filled 2017-10-21 (×3): qty 1

## 2017-10-21 MED ORDER — IOPAMIDOL (ISOVUE-300) INJECTION 61%
80.0000 mL | Freq: Once | INTRAVENOUS | Status: AC | PRN
  Administered 2017-10-21: 80 mL via INTRAVENOUS

## 2017-10-21 MED ORDER — FENTANYL CITRATE (PF) 100 MCG/2ML IJ SOLN
50.0000 ug | Freq: Once | INTRAMUSCULAR | Status: AC
  Administered 2017-10-21: 50 ug via INTRAVENOUS
  Filled 2017-10-21: qty 2

## 2017-10-21 MED ORDER — LISINOPRIL 20 MG PO TABS
30.0000 mg | ORAL_TABLET | Freq: Two times a day (BID) | ORAL | Status: DC
  Administered 2017-10-21 – 2017-10-24 (×6): 30 mg via ORAL
  Filled 2017-10-21 (×6): qty 1

## 2017-10-21 MED ORDER — CALCIUM CARBONATE-VITAMIN D 500-200 MG-UNIT PO TABS
1.0000 | ORAL_TABLET | Freq: Two times a day (BID) | ORAL | Status: DC
  Administered 2017-10-22 – 2017-10-24 (×5): 1 via ORAL
  Filled 2017-10-21 (×6): qty 1

## 2017-10-21 MED ORDER — KETOCONAZOLE 2 % EX CREA
1.0000 "application " | TOPICAL_CREAM | Freq: Two times a day (BID) | CUTANEOUS | Status: DC
  Administered 2017-10-21 – 2017-10-24 (×6): 1 via TOPICAL
  Filled 2017-10-21 (×3): qty 15

## 2017-10-21 MED ORDER — DOCUSATE SODIUM 100 MG PO CAPS
100.0000 mg | ORAL_CAPSULE | Freq: Every evening | ORAL | Status: DC
  Administered 2017-10-22: 100 mg via ORAL
  Filled 2017-10-21: qty 1

## 2017-10-21 MED ORDER — FENTANYL CITRATE (PF) 100 MCG/2ML IJ SOLN
100.0000 ug | Freq: Once | INTRAMUSCULAR | Status: AC
  Administered 2017-10-21: 100 ug via INTRAVENOUS
  Filled 2017-10-21: qty 2

## 2017-10-21 MED ORDER — LATANOPROST 0.005 % OP SOLN
1.0000 [drp] | Freq: Every day | OPHTHALMIC | Status: DC
  Administered 2017-10-22 – 2017-10-23 (×2): 1 [drp] via OPHTHALMIC
  Filled 2017-10-21: qty 2.5

## 2017-10-21 MED ORDER — HYDROCODONE-ACETAMINOPHEN 5-325 MG PO TABS
1.0000 | ORAL_TABLET | ORAL | Status: DC | PRN
  Administered 2017-10-21 – 2017-10-22 (×2): 2 via ORAL
  Filled 2017-10-21 (×3): qty 2

## 2017-10-21 MED ORDER — ONDANSETRON HCL 4 MG/2ML IJ SOLN
4.0000 mg | Freq: Once | INTRAMUSCULAR | Status: AC
  Administered 2017-10-21: 4 mg via INTRAVENOUS
  Filled 2017-10-21: qty 2

## 2017-10-21 MED ORDER — CALCIUM-MAGNESIUM-VITAMIN D ER 600-40-500 MG-MG-UNIT PO TB24: 1.0000 | ORAL_TABLET | Freq: Two times a day (BID) | ORAL | Status: DC

## 2017-10-21 MED ORDER — FLUCONAZOLE 150 MG PO TABS: 150.0000 mg | ORAL_TABLET | ORAL | Status: DC

## 2017-10-21 MED ORDER — LINAGLIPTIN 5 MG PO TABS
5.0000 mg | ORAL_TABLET | Freq: Every day | ORAL | Status: DC
  Administered 2017-10-21 – 2017-10-23 (×3): 5 mg via ORAL
  Filled 2017-10-21 (×4): qty 1

## 2017-10-21 MED ORDER — SODIUM CHLORIDE 0.9 % IV BOLUS
1000.0000 mL | Freq: Once | INTRAVENOUS | Status: AC
  Administered 2017-10-21: 1000 mL via INTRAVENOUS

## 2017-10-21 MED ORDER — ACETAMINOPHEN 325 MG PO TABS
650.0000 mg | ORAL_TABLET | Freq: Four times a day (QID) | ORAL | Status: DC | PRN
  Administered 2017-10-22: 650 mg via ORAL
  Filled 2017-10-21: qty 2

## 2017-10-21 MED ORDER — PIOGLITAZONE HCL 30 MG PO TABS
30.0000 mg | ORAL_TABLET | Freq: Every day | ORAL | Status: DC
  Administered 2017-10-22 – 2017-10-24 (×3): 30 mg via ORAL
  Filled 2017-10-21 (×3): qty 1

## 2017-10-21 MED ORDER — VITAMIN C 500 MG PO TABS
1000.0000 mg | ORAL_TABLET | Freq: Every day | ORAL | Status: DC
  Administered 2017-10-22 – 2017-10-24 (×3): 1000 mg via ORAL
  Filled 2017-10-21 (×3): qty 2

## 2017-10-21 MED ORDER — ASPIRIN EC 81 MG PO TBEC: 81.0000 mg | DELAYED_RELEASE_TABLET | Freq: Every evening | ORAL | Status: DC

## 2017-10-21 MED ORDER — ADULT MULTIVITAMIN W/MINERALS CH
1.0000 | ORAL_TABLET | Freq: Every day | ORAL | Status: DC
  Administered 2017-10-22 – 2017-10-24 (×3): 1 via ORAL
  Filled 2017-10-21 (×3): qty 1

## 2017-10-21 MED ORDER — PANTOPRAZOLE SODIUM 40 MG PO TBEC
40.0000 mg | DELAYED_RELEASE_TABLET | Freq: Every day | ORAL | Status: DC
  Administered 2017-10-22 – 2017-10-23 (×2): 40 mg via ORAL
  Filled 2017-10-21 (×2): qty 1

## 2017-10-21 MED ORDER — TRAMADOL HCL 50 MG PO TABS
50.0000 mg | ORAL_TABLET | Freq: Four times a day (QID) | ORAL | Status: DC | PRN
Start: 2017-10-21 — End: 2017-10-24

## 2017-10-21 MED ORDER — ATENOLOL 25 MG PO TABS
25.0000 mg | ORAL_TABLET | Freq: Every evening | ORAL | Status: DC
  Administered 2017-10-21 – 2017-10-24 (×4): 25 mg via ORAL
  Filled 2017-10-21 (×4): qty 1

## 2017-10-21 MED ORDER — ONDANSETRON HCL 4 MG/2ML IJ SOLN: 4.0000 mg | Freq: Four times a day (QID) | INTRAMUSCULAR | Status: DC | PRN

## 2017-10-21 NOTE — ED Triage Notes (Signed)
Pt presents to ED via [facility] with c/o RLQ abdominal pain, per EMS pt has known RLQ hernia per Dr. Cleda Mccreedy and R knee pain and is in need of replacement. Pt also c/o burning with urination at this time, per EMS pt has hx of recurrent UTI, EMS reports baseline mild confusion.

## 2017-10-21 NOTE — ED Notes (Signed)
Patient transported to CT 

## 2017-10-21 NOTE — Progress Notes (Signed)
ANTICOAGULATION CONSULT NOTE - Initial Consult  Pharmacy Consult for warfarin Indication: history of recurrent DVT   Allergies  Allergen Reactions  . Metformin Diarrhea  . Oxycodone-Acetaminophen Nausea And Vomiting  . Oxycodone-Acetaminophen Nausea Only  . Amoxicillin Rash and Other (See Comments)    Has patient had a PCN reaction causing immediate rash, facial/tongue/throat swelling, SOB or lightheadedness with hypotension: No Has patient had a PCN reaction causing severe rash involving mucus membranes or skin necrosis: No Has patient had a PCN reaction that required hospitalization No Has patient had a PCN reaction occurring within the last 10 years: No If all of the above answers are "NO", then may proceed with Cephalosporin use.    Patient Measurements: Height: 5\' 4"  (162.6 cm) Weight: 250 lb (113.4 kg) IBW/kg (Calculated) : 54.7  Vital Signs: Temp: 98.2 F (36.8 C) (04/11 2121) Temp Source: Oral (04/11 2121) BP: 168/64 (04/11 2121) Pulse Rate: 94 (04/11 2121)  Labs: Recent Labs    10/21/17 1646  HGB 11.5*  HCT 34.5*  PLT 356  LABPROT 59.0*  INR 6.86*  CREATININE 1.28*    Estimated Creatinine Clearance: 38.9 mL/min (A) (by C-G formula based on SCr of 1.28 mg/dL (H)).   Medical History: Past Medical History:  Diagnosis Date  . Arthritis   . Basal cell carcinoma 2019   LLE  . Breast cancer ([place]) 2014   right- radiation. Dr Pat Patrick  . Diabetes mellitus without complication ([place])   . Gastric ulcer   . Glaucoma   . Hiatal hernia   . Hypertension   . Osteoporosis   . Recurrent deep vein thrombosis (DVT) ([facility])    Assessment: Pharmacy consulted to dose and monitor warfarin in this 84 year old female who was taking warfarin PTA for a history of recurrent DVT.   INR = 6.9 on admission  Goal of Therapy:  INR 2-3 Monitor platelets by anticoagulation protocol: Yes   Plan:  INR is supratherapeutic. NO WARFARIN DOSE tonight. Will recheck INR with AM labs  tomorrow.  Lenis Noon, PharmD, BCPS Clinical Pharmacist 10/21/2017,9:38 PM

## 2017-10-21 NOTE — Clinical Social Work Note (Addendum)
CSW received consult for "Nursing home placement." CSW staffed with EDP Dr. Clearnce Hasten. CSW met with patient, spouse, and son at bedside, at length. Patient from home with elderly husband. Patient has [facility] coming into the home for 4 hours 3x per week and has been for years. CSW discussed ALF/SNF placement. Spouse states he is able to pay for SNF out of pocket. However, spouse wants patient to come home and increase patient's personal care services, possibly getting a CNA to stay 8 hours overnight. Spouse also wanting to get patient an electronic wheelchair and have a ramp built. Son agreeable and preferable to long term care placement, but spouse is not agreeable and has a safe plan for discharge. CSW provided SNF list and Placement process packet to spouse in the event SNF is explored again. CSW signing off as no further Social Work intervention needed.   Oretha Ellis, Latanya Presser, [place] Social Worker-ED [phone]

## 2017-10-21 NOTE — ED Notes (Signed)
Social work to bedside at this time.

## 2017-10-21 NOTE — ED Provider Notes (Addendum)
[facility] Emergency Department Provider Note  ___________________________________________   First MD Initiated Contact with Patient 10/21/17 1655     (approximate)  I have reviewed the triage vital signs and the nursing notes.   HISTORY  Chief Complaint Abdominal Pain and Knee Pain   HPI [name] is a 84 y.o. female With a history of left lower extremity basal carcinoma, hypertension, DVT on Coumadin who is presenting to the emergency department today with worsening right knee pain and now is nonambulatory.  Patient is accompanied by her husband who states that the patient spends the majority of her time in a recliner, upright but is able to be assisted over to a commode.  The husband says the patient has become nonambulatory over the past 6 weeks.  Has had a left-sided knee replacement but the right is negative.  Also with chronic ventral hernia.  Ongoing fungal infection below the pannus and is now on weekly Diflucan as well as ketoconazole topical.  Patient is cared for at home by her husband who is also elderly and walks with a cane.  They have home health who come out once a week.  Patient also reports minimal bright red rectal bleeding over the past several weeks which she attributes to hemorrhoid.   Past Medical History:  Diagnosis Date  . Arthritis   . Basal cell carcinoma 2019   LLE  . Breast cancer ([place]) 2014   right- radiation. Dr Pat Patrick  . Diabetes mellitus without complication ([place])   . Gastric ulcer   . Glaucoma   . Hiatal hernia   . Hypertension   . Osteoporosis   . Recurrent deep vein thrombosis (DVT) [facility])     Patient Active Problem List   Diagnosis Date Noted  . Recurrent ventral hernia with incarceration 12/13/2015  . Obesity (BMI 35.0-39.9 without comorbidity)   . Essential (primary) hypertension 12/04/2015  . Body mass index (BMI) of 40.0-44.9 in adult ([place]) 11/27/2015  . Aftercare following surgery 06/20/2015  . Type 2  diabetes mellitus ([place]) 06/18/2015  . Deep vein thrombosis (DVT) of lower extremity ([place]) 06/18/2015  . Acid reflux 06/18/2015  . Arthritis urica 06/18/2015  . BP (high blood pressure) 06/18/2015  . Arthritis, degenerative 06/18/2015  . Osteoporosis, post-menopausal 06/18/2015  . Incarcerated ventral hernia 06/04/2015  . Umbilical hernia, incarcerated   . Adenocarcinoma of right breast ([place]) 12/14/2014  . Malignant neoplasm of female breast ([place]) 12/14/2014  . Carcinoma of breast ([place]) 12/14/2014  . Idiopathic localized osteoarthropathy 09/25/2011    Past Surgical History:  Procedure Laterality Date  . APPENDECTOMY    . BREAST BIOPSY Right 2014  . COLONOSCOPY  2013  . PARTIAL COLECTOMY     pre cancerous polyps/ over 10 years ago  . REPLACEMENT TOTAL KNEE    . TONSILLECTOMY    . VENTRAL HERNIA REPAIR N/A 06/04/2015   Procedure:  REPAIR of INCARCERATED VENTRAL HERNIA;  Surgeon: Marlyce Huge, MD;  Location: [facility] ORS;  Service: General;  Laterality: N/A;  . VENTRAL HERNIA REPAIR N/A 12/18/2015   Procedure: repair of recurent incarcerated ventral hernia with mesh;  Surgeon: Florene Glen, MD;  Location: [facility] ORS;  Service: General;  Laterality: N/A;    Prior to Admission medications   Medication Sig Start Date End Date Taking? Authorizing Provider  allopurinol (ZYLOPRIM) 300 MG tablet Take 150 mg by mouth daily.     [provider]  anastrozole (ARIMIDEX) 1 MG tablet TAKE 1 TABLET DAILY 11/16/16  Lloyd Huger, MD  ascorbic acid (VITAMIN C) 1000 MG tablet Take 1,000 mg by mouth daily.    [provider]  aspirin EC 81 MG tablet Take 81 mg by mouth every evening.    [provider]  atenolol (TENORMIN) 25 MG tablet Take 25 mg by mouth every evening.     [provider]  Calcium-Magnesium-Vitamin D (CALCIUM 1200+D3) 600-40-500 MG-MG-UNIT TB24 Take 1 tablet by mouth 2 (two) times daily.    [provider]  cholecalciferol  (VITAMIN D) 1000 UNITS tablet Take 1,000 Units by mouth daily.    [provider]  docusate sodium (COLACE) 100 MG capsule Take 100 mg by mouth at bedtime as needed for mild constipation.    [provider]  FOLIC ACID PO Take 1 tablet by mouth every evening.    [provider]  glucose blood (FREESTYLE LITE) test strip  12/10/15   [provider]  latanoprost (XALATAN) 0.005 % ophthalmic solution Place 1 drop into both eyes at bedtime.     [provider]  lisinopril (PRINIVIL,ZESTRIL) 30 MG tablet Take 30 mg by mouth 2 (two) times daily.    [provider]  Misc Natural Products (OSTEO BI-FLEX ADV DOUBLE ST PO) Take 1 tablet by mouth 2 (two) times daily.    [provider]  Multiple Vitamin (MULTIVITAMIN WITH MINERALS) TABS tablet Take 1 tablet by mouth daily.    [provider]  omeprazole (PRILOSEC) 20 MG capsule Take 20 mg by mouth daily. Pt only uses one capsule in the evening if needed.    [provider]  pioglitazone (ACTOS) 30 MG tablet Take 30 mg by mouth daily.    [provider]  sitaGLIPtin (JANUVIA) 100 MG tablet Take 100 mg by mouth every evening.     [provider]  warfarin (COUMADIN) 6 MG tablet 5 mg every evening. Take as directed    [provider]    Allergies Metformin; Oxycodone-acetaminophen; Oxycodone-acetaminophen; and Amoxicillin  Family History  Problem Relation Age of Onset  . Diabetes Mother   . Hypertension Mother   . Deep vein thrombosis Mother   . Breast cancer Neg Hx     Social History Social History   Tobacco Use  . Smoking status: Never Smoker  . Smokeless tobacco: Never Used  Substance Use Topics  . Alcohol use: No    Alcohol/week: 0.0 oz  . Drug use: No    Review of Systems  Constitutional: No fever/chills Eyes: No visual changes. ENT: No sore throat. Cardiovascular: Denies chest pain. Respiratory: Denies shortness of  breath. Gastrointestinal: No nausea, no vomiting.  No diarrhea.  No constipation. Genitourinary: Negative for dysuria. Musculoskeletal: Negative for back pain. Skin: Negative for rash. Neurological: Negative for headaches, focal weakness or numbness.   ____________________________________________   PHYSICAL EXAM:  VITAL SIGNS: ED Triage Vitals  Enc Vitals Group     BP 10/21/17 1644 (!) 169/76     Pulse Rate 10/21/17 1644 86     Resp 10/21/17 1644 18     Temp 10/21/17 1644 98.1 F (36.7 C)     Temp Source 10/21/17 1644 Oral     SpO2 10/21/17 1644 95 %     Weight 10/21/17 1639 250 lb (113.4 kg)     Height 10/21/17 1639 5\' 4"  (1.626 m)     Head Circumference --      Peak Flow --      Pain Score --  Pain Loc --      Pain Edu? --      Excl. in [place]? --     Constitutional: Alert and oriented. Well appearing and in no acute distress. Eyes: Conjunctivae are normal.  Head: Atraumatic. Nose: No congestion/rhinnorhea. Mouth/Throat: Mucous membranes are moist.  Neck: No stridor.   Cardiovascular: Normal rate, regular rhythm. Grossly normal heart sounds.   Respiratory: Normal respiratory effort.  No retractions. Lungs CTAB. Gastrointestinal: Soft and nontender. No distention.  Ventral hernia about 10 cm, round, soft and easily reducible. Rectal exam with exudate anteriorly as well as thickened, erythematous skin on the bilateral buttocks extending anteriorly and contiguous with the patient's fungal infection under the pannus.  Patient with multiple non-engorged hemorrhoids.  Digital rectal exam with brown stool which is soft.  One small fleck of blood. Musculoskeletal: No tenderness to the right knee.  No effusion or redness.  However, the patient has severe pain with range of motion of the right knee circumferentially to the knee.  Also with an intact dorsalis pedis pulse to the right foot. Neurologic:  Normal speech and language. No gross focal neurologic deficits are  appreciated. Skin: Foul-smelling, moist and erythematous skin under the pannus and extending onto the vulva.  Contiguous with the findings as listed above on the rectal exam. Psychiatric: Mood and affect are normal. Speech and behavior are normal.  ____________________________________________   LABS (all labs ordered are listed, but only abnormal results are displayed)  Labs Reviewed  CBC WITH DIFFERENTIAL/PLATELET - Abnormal; Notable for the following components:      Result Value   WBC 13.2 (*)    RBC 3.62 (*)    Hemoglobin 11.5 (*)    HCT 34.5 (*)    RDW 15.0 (*)    Neutro Abs 10.0 (*)    All other components within normal limits  PROTIME-INR - Abnormal; Notable for the following components:   Prothrombin Time 59.0 (*)    INR 6.86 (*)    All other components within normal limits  COMPREHENSIVE METABOLIC PANEL - Abnormal; Notable for the following components:   Glucose, Bld 161 (*)    BUN 40 (*)    Creatinine, Ser 1.28 (*)    GFR calc non Af Amer 37 (*)    GFR calc Af Amer 43 (*)    All other components within normal limits  URINALYSIS, COMPLETE (UACMP) WITH MICROSCOPIC   ____________________________________________  EKG   ____________________________________________  RADIOLOGY  Right knee with likely cyst.  Possible pathologic fracture.  Gallbladder sludge with gallstones.  Ventral hernia with possible early obstruction.  Right knee CT with arthritis and enchondroma without pathologic fracture. ____________________________________________   PROCEDURES  Procedure(s) performed:   Procedures  Critical Care performed:   ____________________________________________   INITIAL IMPRESSION / ASSESSMENT AND PLAN / ED COURSE  Pertinent labs & imaging results that were available during my care of the patient were reviewed by me and considered in my medical decision making (see chart for details).  DDX: UTI, electrolyte abnormality, anemia, weakness, arthritis,  knee pain, ambulatory dysfunction, ventral hernia, abdominal mass As part of my medical decision making, I reviewed the following data within the [facility] outpatient records  ----------------------------------------- 5:21 PM on 10/21/2017 -----------------------------------------  I discussed the case with Dr. Caryl Comes who reports the patient has been unable to make outpatient appointments because of her nonambulatory status.  He says the patient has complained of chronic abdominal pelvic pain and this was the reason for the  referral to urology.  He is concerned that the patient may need a CAT scan to reveal underlying pathology.  Dr. Caryl Comes also reports that the patient has had multiple urine cultures that have been negative or mixed flora or group B strep.  ----------------------------------------- 7:18 PM on 10/21/2017 -----------------------------------------  Patient now yelling out in pain her right knee.  We will give an additional 100 mcg of fentanyl.  Patient with constellation of complaints and failing outpatient care.  Will be admitted for pain control.  I also reviewed the CAT scan images with the patient and family.  They are understanding and willing to comply with the plan for admission to the hospital.  They are also spoken to by social work and the patient is not completely ready to be admitted to a skilled nursing facility.  The social worker did discuss increasing frequency of home care.  Signed out to Dr. Verdell Carmine.  Despite the CAT scan read the hernia continues to be soft and reducible with minimal to no tenderness to palpation.  Recommend holding 1-2 doses of the Coumadin for her increased INR.  Fluids for renal insufficiency. ____________________________________________   FINAL CLINICAL IMPRESSION(S) / ED DIAGNOSES  Right knee pain secondary to arthritis.  Ventral hernia.  Coumadin coagulopathy.  Tinea corporis.     NEW MEDICATIONS STARTED DURING  THIS VISIT:  New Prescriptions   No medications on file     Note:  This document was prepared using Dragon voice recognition software and may include unintentional dictation errors.     Orbie Pyo, MD 10/21/17 Richardson Chiquito, MD 10/21/17 [phone]

## 2017-10-21 NOTE — H&P (Addendum)
[facility] [place] NAME: [name]    MR#:  [id number]  DATE OF BIRTH:  02-26-1929  DATE OF ADMISSION:  10/21/2017  PRIMARY CARE PHYSICIAN: Adin Hector, MD   REQUESTING/REFERRING PHYSICIAN: Dr. Larae Grooms  CHIEF COMPLAINT:   Chief Complaint  Patient presents with  . Abdominal Pain  . Knee Pain    HISTORY OF PRESENT ILLNESS:  [name]  is a 84 y.o. female with a known history of morbid obesity, hypertension, chronic hiatal hernia, glaucoma, history of breast cancer, diabetes, osteoporosis, chronic abdominal pain who presents to the hospital due to significant left knee pain and also abdominal pain.  Patient's husband who gives most of the history says that normally patient is able to pivot off the bed and get to a bedside commode but over the past few days her mobility has been significantly worse and she has been crying/complaining of abdominal/knee pain.  She came to the ER underwent a CT of the abdomen pelvis which showed a hiatal hernia but no evidence of bowel obstruction, she also underwent a CT of her left knee which showed a benign tumor of the cartilage but otherwise no abnormality.  She received multiple doses of IV fentanyl but despite that continues to have significant left knee pain and therefore hospitalist services were contacted for treatment evaluation.  PAST MEDICAL HISTORY:   Past Medical History:  Diagnosis Date  . Arthritis   . Basal cell carcinoma 2019   LLE  . Breast cancer ([place]) 2014   right- radiation. Dr Pat Patrick  . Diabetes mellitus without complication ([place])   . Gastric ulcer   . Glaucoma   . Hiatal hernia   . Hypertension   . Osteoporosis   . Recurrent deep vein thrombosis (DVT) ([place])     PAST SURGICAL HISTORY:   Past Surgical History:  Procedure Laterality Date  . APPENDECTOMY    . BREAST BIOPSY Right 2014  . COLONOSCOPY  2013  . PARTIAL COLECTOMY     pre cancerous polyps/ over 10 years  ago  . REPLACEMENT TOTAL KNEE    . TONSILLECTOMY    . VENTRAL HERNIA REPAIR N/A 06/04/2015   Procedure:  REPAIR of INCARCERATED VENTRAL HERNIA;  Surgeon: Marlyce Huge, MD;  Location: [facility] ORS;  Service: General;  Laterality: N/A;  . VENTRAL HERNIA REPAIR N/A 12/18/2015   Procedure: repair of recurent incarcerated ventral hernia with mesh;  Surgeon: Florene Glen, MD;  Location: [facility] ORS;  Service: General;  Laterality: N/A;    SOCIAL HISTORY:   Social History   Tobacco Use  . Smoking status: Never Smoker  . Smokeless tobacco: Never Used  Substance Use Topics  . Alcohol use: No    Alcohol/week: 0.0 oz    FAMILY HISTORY:   Family History  Problem Relation Age of Onset  . Diabetes Mother   . Hypertension Mother   . Deep vein thrombosis Mother   . Heart attack Father   . Breast cancer Neg Hx     DRUG ALLERGIES:   Allergies  Allergen Reactions  . Metformin Diarrhea  . Oxycodone-Acetaminophen Nausea And Vomiting  . Oxycodone-Acetaminophen Nausea Only  . Amoxicillin Rash and Other (See Comments)    Has patient had a PCN reaction causing immediate rash, facial/tongue/throat swelling, SOB or lightheadedness with hypotension: No Has patient had a PCN reaction causing severe rash involving mucus membranes or skin necrosis: No Has patient had a PCN reaction that required  hospitalization No Has patient had a PCN reaction occurring within the last 10 years: No If all of the above answers are "NO", then may proceed with Cephalosporin use.    REVIEW OF SYSTEMS:   Review of Systems  Constitutional: Negative for fever and weight loss.  HENT: Negative for congestion, nosebleeds and tinnitus.   Eyes: Negative for blurred vision, double vision and redness.  Respiratory: Negative for cough, hemoptysis and shortness of breath.   Cardiovascular: Negative for chest pain, orthopnea, leg swelling and PND.  Gastrointestinal: Positive for abdominal pain. Negative for diarrhea,  melena, nausea and vomiting.  Genitourinary: Negative for dysuria, hematuria and urgency.  Musculoskeletal: Positive for joint pain (Left knee pain). Negative for falls.  Neurological: Negative for dizziness, tingling, sensory change, focal weakness, seizures, weakness and headaches.  Endo/Heme/Allergies: Negative for polydipsia. Does not bruise/bleed easily.  Psychiatric/Behavioral: Negative for depression and memory loss. The patient is not nervous/anxious.     MEDICATIONS AT HOME:   Prior to Admission medications   Medication Sig Start Date End Date Taking? Authorizing Provider  allopurinol (ZYLOPRIM) 300 MG tablet Take 150 mg by mouth daily.     [provider]  anastrozole (ARIMIDEX) 1 MG tablet TAKE 1 TABLET DAILY 11/16/16   Lloyd Huger, MD  ascorbic acid (VITAMIN C) 1000 MG tablet Take 1,000 mg by mouth daily.    [provider]  aspirin EC 81 MG tablet Take 81 mg by mouth every evening.    [provider]  atenolol (TENORMIN) 25 MG tablet Take 25 mg by mouth every evening.     [provider]  Calcium-Magnesium-Vitamin D (CALCIUM 1200+D3) 600-40-500 MG-MG-UNIT TB24 Take 1 tablet by mouth 2 (two) times daily.    [provider]  cholecalciferol (VITAMIN D) 1000 UNITS tablet Take 1,000 Units by mouth daily.    [provider]  docusate sodium (COLACE) 100 MG capsule Take 100 mg by mouth at bedtime as needed for mild constipation.    [provider]  FOLIC ACID PO Take 1 tablet by mouth every evening.    [provider]  glucose blood (FREESTYLE LITE) test strip  12/10/15   [provider]  latanoprost (XALATAN) 0.005 % ophthalmic solution Place 1 drop into both eyes at bedtime.     [provider]  lisinopril (PRINIVIL,ZESTRIL) 30 MG tablet Take 30 mg by mouth 2 (two) times daily.    [provider]  Misc Natural Products (OSTEO BI-FLEX ADV DOUBLE ST PO) Take 1 tablet by mouth 2 (two)  times daily.    [provider]  Multiple Vitamin (MULTIVITAMIN WITH MINERALS) TABS tablet Take 1 tablet by mouth daily.    [provider]  omeprazole (PRILOSEC) 20 MG capsule Take 20 mg by mouth daily. Pt only uses one capsule in the evening if needed.    [provider]  pioglitazone (ACTOS) 30 MG tablet Take 30 mg by mouth daily.    [provider]  sitaGLIPtin (JANUVIA) 100 MG tablet Take 100 mg by mouth every evening.     [provider]  warfarin (COUMADIN) 6 MG tablet 5 mg every evening. Take as directed    [provider]      VITAL SIGNS:  Blood pressure (!) 174/62, pulse 91, temperature 98.1 F (36.7 C), temperature source Oral, resp. rate 18, height 5\' 4"  (1.626 m), weight 113.4 kg (250 lb), SpO2 95 %.  PHYSICAL EXAMINATION:  Physical Exam  GENERAL:  84 y.o.-year-old obese  patient lying in the bed crying and complaining of abdominal/knee pain.  EYES: Pupils equal, round, reactive to light and accommodation. No scleral icterus. Extraocular muscles intact.  HEENT: Head atraumatic, normocephalic. Oropharynx and nasopharynx clear. No oropharyngeal erythema, moist oral mucosa  NECK:  Supple, no jugular venous distention. No thyroid enlargement, no tenderness.  LUNGS: Normal breath sounds bilaterally, no wheezing, rales, rhonchi. No use of accessory muscles of respiration.  CARDIOVASCULAR: S1, S2 RRR. No murmurs, rubs, gallops, clicks.  ABDOMEN: Soft, nontender, nondistended. Bowel sounds present. No organomegaly or mass.  Ventral hernia which is easily reducible. EXTREMITIES: No pedal edema, cyanosis, or clubbing. + 2 pedal & radial pulses b/l.   NEUROLOGIC: Cranial nerves II through XII are intact. No focal Motor or sensory deficits appreciated b/l globally weak PSYCHIATRIC: The patient is alert and oriented x 3.  SKIN: No lesion, or ulcer.  Fungal rash noted with foul-smelling in the lower abdomen area in the inguinal  area.  LABORATORY PANEL:   CBC Recent Labs  Lab 10/21/17 1646  WBC 13.2*  HGB 11.5*  HCT 34.5*  PLT 356   ------------------------------------------------------------------------------------------------------------------  Chemistries  Recent Labs  Lab 10/21/17 1646  NA 136  K 4.2  CL 105  CO2 23  GLUCOSE 161*  BUN 40*  CREATININE 1.28*  CALCIUM 9.2  AST 26  ALT 19  ALKPHOS 90  BILITOT 0.5   ------------------------------------------------------------------------------------------------------------------  Cardiac Enzymes No results for input(s): TROPONINI in the last 168 hours. ------------------------------------------------------------------------------------------------------------------  RADIOLOGY:  Ct Knee Right Wo Contrast  Result Date: 10/21/2017 CLINICAL DATA:  Knee pain.  Followup abnormal x-rays. EXAM: CT OF THE right KNEE WITHOUT CONTRAST TECHNIQUE: Multidetector CT imaging of the right knee was performed according to the standard protocol. Multiplanar CT image reconstructions were also generated. COMPARISON:  Radiographs 10/21/2017 FINDINGS: Tricompartmental degenerative changes with joint space narrowing and spurring. This is most significant in the lateral compartment. No acute fracture.  No osteochondral lesion. There is a chondroid lesion in the fibular metaphysis. This is likely benign but does abut the cortex. No definite fracture is identified. Small amount of joint fluid without overt joint effusion. Fatty atrophy of the thigh and calf musculature. IMPRESSION: 1. Probably benign enchondroma involving the fibular metaphysis. Given the patient's age I would just follow this radiographically. No definite pathologic fracture involving the fibula. 2. Tricompartmental degenerative changes but no acute bony abnormality. Electronically Signed   By: Marijo Sanes M.D.   On: 10/21/2017 18:44   Ct Abdomen Pelvis W Contrast  Result Date: 10/21/2017 CLINICAL DATA:   Right lower quadrant abdominal pain.  Known hernia. EXAM: CT ABDOMEN AND PELVIS WITH CONTRAST TECHNIQUE: Multidetector CT imaging of the abdomen and pelvis was performed using the standard protocol following bolus administration of intravenous contrast. CONTRAST:  52mL ISOVUE-300 IOPAMIDOL (ISOVUE-300) INJECTION 61% COMPARISON:  12/13/2015 FINDINGS: Lower chest: Stable emphysematous changes and pulmonary scarring. No pleural effusion. The heart is enlarged. Stable aortic and coronary artery calcifications. Hepatobiliary: No focal hepatic lesions or intrahepatic biliary dilatation. The gallbladder is distended and contains sludge and gallstones. No definite findings for acute cholecystitis. Normal caliber common bile duct. Pancreas: No mass, inflammation or ductal dilatation. Spleen: Normal size.  No focal lesions. Adrenals/Urinary Tract: Adrenal glands are normal. No significant renal findings. The bladder is normal. Stomach/Bowel: Evidence of previous bowel surgery with a right hemicolectomy. There is an anterior abdominal wall hernia containing small bowel loops. Some of these loops are dilated and demonstrate air-fluid levels. Marked narrowing of adjacent  bowel loops worrisome for obstruction/incarceration. There is interstitial changes and edema around these bowel loops. The stomach, duodenum and proximal small bowel loops appear normal. The colon the remarkable.  Moderate sigmoid diverticulosis. Vascular/Lymphatic: Stable aortic calcifications without focal aneurysm or dissection. The branch vessels are patent. The major venous structures are patent. No mesenteric or retroperitoneal mass or adenopathy. Reproductive: The uterus and ovaries are unremarkable. Other: No inguinal mass or adenopathy. Musculoskeletal: No acute or significant bony findings. Advanced degenerative changes involving the spine. IMPRESSION: 1. Lower anterior abdominal wall hernia with findings suspicious for incarceration and early  obstruction of the small bowel loops. 2. Distended gallbladder with gallstones and sludge but no definite findings for acute cholecystitis. Electronically Signed   By: Marijo Sanes M.D.   On: 10/21/2017 18:37   Dg Knee Complete 4 Views Right  Result Date: 10/21/2017 CLINICAL DATA:  RIGHT knee pain EXAM: RIGHT KNEE - COMPLETE 4+ VIEW COMPARISON:  MR RIGHT knee 12/10/2016 FINDINGS: Osseous demineralization. Joint space narrowing and spur formation greatest at the lateral compartment. Irregular sclerotic lesion within the proximal RIGHT fibular metaphysis likely an enchondroma measuring 2.7 cm in length. On the lateral view, a questionable cortical irregularity is seen at the posterior inferior margin of the proximal fibular lesion, cannot exclude subtle pathologic fracture. No additional fracture or dislocation. Small tibial tubercle spur. No knee joint effusion. IMPRESSION: Chronic chondroid lesion at the proximal RIGHT fibular metaphysis favor enchondroma 2.7 cm in length with questionable pathologic fracture at the posterior cortex on the lateral view; correlation for pain/tenderness at the proximal fibula is recommended. Electronically Signed   By: Lavonia Dana M.D.   On: 10/21/2017 17:35     IMPRESSION AND PLAN:   84 year old female with past medical history of known history of morbid obesity, hypertension, chronic hiatal hernia, glaucoma, history of breast cancer, diabetes, osteoporosis, chronic abdominal pain who presents to the hospital due to significant left knee pain and also abdominal pain.  1.  Left knee pain-etiology unclear suspected to be secondary to underlying chronic osteoarthritis with a benign enchondroma that was noted on the CT on admission. -Patient is being admitted for pain control as she is having difficulty in voiding and bearing weight on that leg. -Patient was given multiple doses of IV fentanyl without much improvable in her symptoms. -We will admit the patient under  observation, start the patient on some oral tramadol and Vicodin as needed.  Patient's son and husband are aware that she cannot be placed in assisted living and there will be okay with patient getting some home health services.  2.  Hx of recurrent DVT-continue Coumadin, INR is currently supratherapeutic  3.  History of glaucoma-continue latanoprost eyedrops.  4.  Essential hypertension-continue lisinopril atenolol.  5.  History of diabetes-continue Januvia, Actos.  6.  GERD-continue Protonix.  7.  Fungal rash-continue fluconazole, ketoconazole topical.  All the records are reviewed and case discussed with ED provider. Management plans discussed with the patient, family and they are in agreement.  CODE STATUS: Full code  TOTAL TIME TAKING CARE OF THIS PATIENT: 40 minutes.    Henreitta Leber M.D on 10/21/2017 at 7:41 PM  Between 7am to 6pm - Pager - [phone]  After 6pm go to [email or web address] - password [email or web address] [facility]  [facility]  Office  [phone]  CC: Primary care physician; Adin Hector, MD

## 2017-10-21 NOTE — ED Notes (Signed)
Report called to dawn rn floor nurse 

## 2017-10-21 NOTE — ED Notes (Signed)
Resumed care from [facility].  Pt alert.  primedoc in with pt for admission.  Iv fluids infusing.  Pain meds given.

## 2017-10-22 ENCOUNTER — Observation Stay: Payer: Medicare Other

## 2017-10-22 DIAGNOSIS — K432 Incisional hernia without obstruction or gangrene: Secondary | ICD-10-CM

## 2017-10-22 LAB — URINALYSIS, COMPLETE (UACMP) WITH MICROSCOPIC
Bilirubin Urine: NEGATIVE
Glucose, UA: NEGATIVE mg/dL
Hgb urine dipstick: NEGATIVE
Ketones, ur: 5 mg/dL — AB
Nitrite: NEGATIVE
Protein, ur: 30 mg/dL — AB
Specific Gravity, Urine: 1.042 — ABNORMAL HIGH (ref 1.005–1.030)
pH: 5 (ref 5.0–8.0)

## 2017-10-22 LAB — PROTIME-INR
INR: 7.1 — AB
Prothrombin Time: 60.6 seconds — ABNORMAL HIGH (ref 11.4–15.2)

## 2017-10-22 LAB — HEMOGLOBIN: HEMOGLOBIN: 11.2 g/dL — AB (ref 12.0–16.0)

## 2017-10-22 MED ORDER — BISACODYL 10 MG RE SUPP
10.0000 mg | Freq: Once | RECTAL | Status: AC
  Administered 2017-10-23: 10 mg via RECTAL
  Filled 2017-10-22 (×3): qty 1

## 2017-10-22 MED ORDER — WARFARIN SODIUM 3 MG PO TABS: 5.0000 mg | ORAL_TABLET | Freq: Every evening | ORAL | 0 refills | Status: DC

## 2017-10-22 MED ORDER — HYDROCODONE-ACETAMINOPHEN 5-325 MG PO TABS: 1.0000 | ORAL_TABLET | ORAL | 0 refills | Status: DC | PRN

## 2017-10-22 NOTE — Progress Notes (Addendum)
ANTICOAGULATION CONSULT NOTE - Initial Consult  Pharmacy Consult for warfarin Indication: history of recurrent DVT   Allergies  Allergen Reactions  . Metformin Diarrhea  . Oxycodone-Acetaminophen Nausea And Vomiting  . Oxycodone-Acetaminophen Nausea Only  . Amoxicillin Rash and Other (See Comments)    Has patient had a PCN reaction causing immediate rash, facial/tongue/throat swelling, SOB or lightheadedness with hypotension: No Has patient had a PCN reaction causing severe rash involving mucus membranes or skin necrosis: No Has patient had a PCN reaction that required hospitalization No Has patient had a PCN reaction occurring within the last 10 years: No If all of the above answers are "NO", then may proceed with Cephalosporin use.    Patient Measurements: Height: 5\' 4"  (162.6 cm) Weight: 250 lb (113.4 kg) IBW/kg (Calculated) : 54.7  Vital Signs: Temp: 98.4 F (36.9 C) (04/12 0528) Temp Source: Oral (04/12 0528) BP: 109/43 (04/12 0528) Pulse Rate: 72 (04/12 0528)  Labs: Recent Labs    10/21/17 1646 10/22/17 0619  HGB 11.5*  --   HCT 34.5*  --   PLT 356  --   LABPROT 59.0* 60.6*  INR 6.86* 7.10*  CREATININE 1.28*  --     Estimated Creatinine Clearance: 38.9 mL/min (A) (by C-G formula based on SCr of 1.28 mg/dL (H)).   Medical History: Past Medical History:  Diagnosis Date  . Arthritis   . Basal cell carcinoma 2019   LLE  . Breast cancer ([place]) 2014   right- radiation. Dr Pat Patrick  . Diabetes mellitus without complication ([place])   . Gastric ulcer   . Glaucoma   . Hiatal hernia   . Hypertension   . Osteoporosis   . Recurrent deep vein thrombosis (DVT) ([facility])    Assessment: Pharmacy consulted to dose and monitor warfarin in this 84 year old female who was taking warfarin PTA for a history of recurrent DVT.   INR = 6.9 on admission  Goal of Therapy:  INR 2-3 Monitor platelets by anticoagulation protocol: Yes   Plan:  INR is supratherapeutic. NO WARFARIN  DOSE tonight. Will recheck INR with AM labs tomorrow.  4/12 INR 7.1. Supratherapeutic INR likely due to drug-drug interaction with fluconazole. Continue to hold VKA. Recheck daily with AM labs.  Laural Benes, PharmD, BCPS Clinical Pharmacist 10/22/2017,9:19 AM

## 2017-10-22 NOTE — Discharge Summary (Signed)
[facility] NAME: [name]    MR#:  [id number]  DATE OF BIRTH:  06-23-1929  DATE OF ADMISSION:  10/21/2017 ADMITTING PHYSICIAN: Henreitta Leber, MD  DATE OF DISCHARGE: 10/22/2017  PRIMARY CARE PHYSICIAN: Tama High III, MD    ADMISSION DIAGNOSIS:  Tinea corporis [B35.4] Elevated INR [R79.1] Acute pain of right knee [M25.561]  DISCHARGE DIAGNOSIS:  Active Problems:   Recurrent ventral hernia   Left knee pain   SECONDARY DIAGNOSIS:   Past Medical History:  Diagnosis Date  . Arthritis   . Basal cell carcinoma 2019   LLE  . Breast cancer ([place]) 2014   right- radiation. Dr Pat Patrick  . Diabetes mellitus without complication ([place])   . Gastric ulcer   . Glaucoma   . Hiatal hernia   . Hypertension   . Osteoporosis   . Recurrent deep vein thrombosis (DVT) [facility])     HOSPITAL COURSE:   84 year old female with past medical history of known history of morbid obesity, hypertension, chronic hiatal hernia, glaucoma, history of breast cancer, diabetes, osteoporosis, chronic abdominal pain who presents to the hospital due to significant left knee pain and also abdominal pain.  1.  Left knee pain-etiology unclear suspected to be secondary to underlying chronic osteoarthritis with   a benign enchondroma that was noted on the CT on admission. - Patient is being admitted for pain control as she is having difficulty in voiding and bearing weight on   that leg.  -on further discussion with her son, patient does not get up and walk much, her husband and son helps her at home but they are requesting to get more services. - Spoke with case managerAbout arrangements of home health, physical therapy, visiting nurse and will discharge home today.  2.  Hx of recurrent DVT-held Coumadin, INR is currently supratherapeutic   Advised to hold Coumadin 3 more days and restarted at lower dose and check her INR in one week with PMD  3.   History of glaucoma-continue latanoprost eyedrops.  4.  Essential hypertension-continue lisinopril atenolol.  5.  History of diabetes-continue Januvia, Actos.  6.  GERD-continue Protonix.  7.  Fungal rash-continue fluconazole, ketoconazole topical   DISCHARGE CONDITIONS:   Stable.  CONSULTS OBTAINED:    DRUG ALLERGIES:   Allergies  Allergen Reactions  . Metformin Diarrhea  . Oxycodone-Acetaminophen Nausea And Vomiting  . Oxycodone-Acetaminophen Nausea Only  . Amoxicillin Rash and Other (See Comments)    Has patient had a PCN reaction causing immediate rash, facial/tongue/throat swelling, SOB or lightheadedness with hypotension: No Has patient had a PCN reaction causing severe rash involving mucus membranes or skin necrosis: No Has patient had a PCN reaction that required hospitalization No Has patient had a PCN reaction occurring within the last 10 years: No If all of the above answers are "NO", then may proceed with Cephalosporin use.    DISCHARGE MEDICATIONS:   Allergies as of 10/22/2017      Reactions   Metformin Diarrhea   Oxycodone-acetaminophen Nausea And Vomiting   Oxycodone-acetaminophen Nausea Only   Amoxicillin Rash, Other (See Comments)   Has patient had a PCN reaction causing immediate rash, facial/tongue/throat swelling, SOB or lightheadedness with hypotension: No Has patient had a PCN reaction causing severe rash involving mucus membranes or skin necrosis: No Has patient had a PCN reaction that required hospitalization No Has patient had a PCN reaction occurring within the last 10 years: No If all  of the above answers are "NO", then may proceed with Cephalosporin use.      Medication List    STOP taking these medications   anastrozole 1 MG tablet Commonly known as:  ARIMIDEX   HEMORRHOIDAL MAX ST/ALOE 1-0.25-14.4-15 % Crea Generic drug:  Pramox-PE-Glycerin-Petrolatum   pioglitazone 30 MG tablet Commonly known as:  ACTOS     TAKE these  medications   allopurinol 100 MG tablet Commonly known as:  ZYLOPRIM Take 100 mg by mouth daily.   ascorbic acid 1000 MG tablet Commonly known as:  VITAMIN C Take 1,000 mg by mouth daily.   aspirin EC 81 MG tablet Take 81 mg by mouth every evening.   atenolol 25 MG tablet Commonly known as:  TENORMIN Take 25 mg by mouth every evening.   CALCIUM 1200+D3 600-40-500 MG-MG-UNIT Tb24 Generic drug:  Calcium-Magnesium-Vitamin D Take 1 tablet by mouth 2 (two) times daily.   cholecalciferol 1000 units tablet Commonly known as:  VITAMIN D Take 1,000 Units by mouth daily.   docusate sodium 100 MG capsule Commonly known as:  COLACE Take 100 mg by mouth every evening.   fluconazole 150 MG tablet Commonly known as:  DIFLUCAN Take 1 tablet by mouth every 7 (seven) days.   FOLIC ACID PO Take 1 tablet by mouth every evening.   HYDROcodone-acetaminophen 5-325 MG tablet Commonly known as:  NORCO/VICODIN Take 1-2 tablets by mouth every 4 (four) hours as needed for severe pain.   ketoconazole 2 % cream Commonly known as:  NIZORAL Apply 1 application topically 2 (two) times daily.   latanoprost 0.005 % ophthalmic solution Commonly known as:  XALATAN Place 1 drop into both eyes at bedtime.   lisinopril 30 MG tablet Commonly known as:  PRINIVIL,ZESTRIL Take 30 mg by mouth 2 (two) times daily.   multivitamin with minerals Tabs tablet Take 1 tablet by mouth daily.   omeprazole 20 MG capsule Commonly known as:  PRILOSEC Take 20 mg by mouth every evening.   OSTEO BI-FLEX ADV DOUBLE ST PO Take 1 tablet by mouth 2 (two) times daily.   sitaGLIPtin 100 MG tablet Commonly known as:  JANUVIA Take 100 mg by mouth every evening.   warfarin 3 MG tablet Commonly known as:  COUMADIN Take 1.5 tablets (4.5 mg total) by mouth every evening. INR is high, so start after 3 days. Start taking on:  10/25/2017 What changed:    medication strength  how much to take  additional  instructions  These instructions start on 10/25/2017. If you are unsure what to do until then, ask your doctor or other care provider.        DISCHARGE INSTRUCTIONS:    Follow with PMD in 1 week for INR>  If you experience worsening of your admission symptoms, develop shortness of breath, life threatening emergency, suicidal or homicidal thoughts you must seek medical attention immediately by calling 911 or calling your MD immediately  if symptoms less severe.  You Must read complete instructions/literature along with all the possible adverse reactions/side effects for all the Medicines you take and that have been prescribed to you. Take any new Medicines after you have completely understood and accept all the possible adverse reactions/side effects.   Please note  You were cared for by a hospitalist during your hospital stay. If you have any questions about your discharge medications or the care you received while you were in the hospital after you are discharged, you can call the unit and asked to speak  with the hospitalist on call if the hospitalist that took care of you is not available. Once you are discharged, your primary care physician will handle any further medical issues. Please note that NO REFILLS for any discharge medications will be authorized once you are discharged, as it is imperative that you return to your primary care physician (or establish a relationship with a primary care physician if you do not have one) for your aftercare needs so that they can reassess your need for medications and monitor your lab values.    Today   CHIEF COMPLAINT:   Chief Complaint  Patient presents with  . Abdominal Pain  . Knee Pain    HISTORY OF PRESENT ILLNESS:  [name]  is a 84 y.o. female with a known history of morbid obesity, hypertension, chronic hiatal hernia, glaucoma, history of breast cancer, diabetes, osteoporosis, chronic abdominal pain who presents to the hospital due  to significant left knee pain and also abdominal pain.  Patient's husband who gives most of the history says that normally patient is able to pivot off the bed and get to a bedside commode but over the past few days her mobility has been significantly worse and she has been crying/complaining of abdominal/knee pain.  She came to the ER underwent a CT of the abdomen pelvis which showed a hiatal hernia but no evidence of bowel obstruction, she also underwent a CT of her left knee which showed a benign tumor of the cartilage but otherwise no abnormality.  She received multiple doses of IV fentanyl but despite that continues to have significant left knee pain and therefore hospitalist services were contacted for treatment evaluation.    VITAL SIGNS:  Blood pressure (!) 124/52, pulse 70, temperature 98.3 F (36.8 C), temperature source Oral, resp. rate 20, height 5\' 4"  (1.626 m), weight 113.4 kg (250 lb), SpO2 95 %.  I/O:    Intake/Output Summary (Last 24 hours) at 10/22/2017 1606 Last data filed at 10/22/2017 1300 Gross per 24 hour  Intake 340 ml  Output 550 ml  Net -210 ml    PHYSICAL EXAMINATION:   GENERAL:  84 y.o.-year-old obese patient lying in the bed crying and complaining of abdominal/knee pain.  EYES: Pupils equal, round, reactive to light and accommodation. No scleral icterus. Extraocular muscles intact.  HEENT: Head atraumatic, normocephalic. Oropharynx and nasopharynx clear. No oropharyngeal erythema, moist oral mucosa  NECK:  Supple, no jugular venous distention. No thyroid enlargement, no tenderness.  LUNGS: Normal breath sounds bilaterally, no wheezing, rales, rhonchi. No use of accessory muscles of respiration.  CARDIOVASCULAR: S1, S2 RRR. No murmurs, rubs, gallops, clicks.  ABDOMEN: Soft, nontender, nondistended. Bowel sounds present. No organomegaly or mass.  Ventral hernia which is easily reducible. EXTREMITIES: No pedal edema, cyanosis, or clubbing. + 2 pedal & radial  pulses b/l.   NEUROLOGIC: Cranial nerves II through XII are intact. No focal Motor or sensory deficits appreciated b/l globally weak PSYCHIATRIC: The patient is alert and oriented x 3.  SKIN: No lesion, or ulcer.  Fungal rash noted with foul-smelling in the lower abdomen area in the inguinal area.    DATA REVIEW:   CBC Recent Labs  Lab 10/21/17 1646  WBC 13.2*  HGB 11.5*  HCT 34.5*  PLT 356    Chemistries  Recent Labs  Lab 10/21/17 1646  NA 136  K 4.2  CL 105  CO2 23  GLUCOSE 161*  BUN 40*  CREATININE 1.28*  CALCIUM 9.2  AST 26  ALT 19  ALKPHOS 90  BILITOT 0.5    Cardiac Enzymes No results for input(s): TROPONINI in the last 168 hours.  Microbiology Results  Results for orders placed or performed during the hospital encounter of 08/29/17  Urine Culture     Status: Abnormal   Collection Time: 08/29/17  3:35 PM  Result Value Ref Range Status   Specimen Description   Final    URINE, RANDOM Performed at [facility], [address], [place], [place] [address]    Special Requests   Final    NONE Performed at [facility], [address], [place],  [address]    Culture MULTIPLE SPECIES PRESENT, SUGGEST RECOLLECTION (A)  Final   Report Status 08/31/2017 FINAL  Final    RADIOLOGY:  Ct Knee Right Wo Contrast  Result Date: 10/21/2017 CLINICAL DATA:  Knee pain.  Followup abnormal x-rays. EXAM: CT OF THE right KNEE WITHOUT CONTRAST TECHNIQUE: Multidetector CT imaging of the right knee was performed according to the standard protocol. Multiplanar CT image reconstructions were also generated. COMPARISON:  Radiographs 10/21/2017 FINDINGS: Tricompartmental degenerative changes with joint space narrowing and spurring. This is most significant in the lateral compartment. No acute fracture.  No osteochondral lesion. There is a chondroid lesion in the fibular metaphysis. This is likely benign but does abut the cortex. No definite fracture is  identified. Small amount of joint fluid without overt joint effusion. Fatty atrophy of the thigh and calf musculature. IMPRESSION: 1. Probably benign enchondroma involving the fibular metaphysis. Given the patient's age I would just follow this radiographically. No definite pathologic fracture involving the fibula. 2. Tricompartmental degenerative changes but no acute bony abnormality. Electronically Signed   By: Marijo Sanes M.D.   On: 10/21/2017 18:44   Ct Abdomen Pelvis W Contrast  Result Date: 10/21/2017 CLINICAL DATA:  Right lower quadrant abdominal pain.  Known hernia. EXAM: CT ABDOMEN AND PELVIS WITH CONTRAST TECHNIQUE: Multidetector CT imaging of the abdomen and pelvis was performed using the standard protocol following bolus administration of intravenous contrast. CONTRAST:  11mL ISOVUE-300 IOPAMIDOL (ISOVUE-300) INJECTION 61% COMPARISON:  12/13/2015 FINDINGS: Lower chest: Stable emphysematous changes and pulmonary scarring. No pleural effusion. The heart is enlarged. Stable aortic and coronary artery calcifications. Hepatobiliary: No focal hepatic lesions or intrahepatic biliary dilatation. The gallbladder is distended and contains sludge and gallstones. No definite findings for acute cholecystitis. Normal caliber common bile duct. Pancreas: No mass, inflammation or ductal dilatation. Spleen: Normal size.  No focal lesions. Adrenals/Urinary Tract: Adrenal glands are normal. No significant renal findings. The bladder is normal. Stomach/Bowel: Evidence of previous bowel surgery with a right hemicolectomy. There is an anterior abdominal wall hernia containing small bowel loops. Some of these loops are dilated and demonstrate air-fluid levels. Marked narrowing of adjacent bowel loops worrisome for obstruction/incarceration. There is interstitial changes and edema around these bowel loops. The stomach, duodenum and proximal small bowel loops appear normal. The colon the remarkable.  Moderate sigmoid  diverticulosis. Vascular/Lymphatic: Stable aortic calcifications without focal aneurysm or dissection. The branch vessels are patent. The major venous structures are patent. No mesenteric or retroperitoneal mass or adenopathy. Reproductive: The uterus and ovaries are unremarkable. Other: No inguinal mass or adenopathy. Musculoskeletal: No acute or significant bony findings. Advanced degenerative changes involving the spine. IMPRESSION: 1. Lower anterior abdominal wall hernia with findings suspicious for incarceration and early obstruction of the small bowel loops. 2. Distended gallbladder with gallstones and sludge but no definite findings for acute cholecystitis.  Electronically Signed   By: Marijo Sanes M.D.   On: 10/21/2017 18:37   Dg Knee Complete 4 Views Right  Result Date: 10/21/2017 CLINICAL DATA:  RIGHT knee pain EXAM: RIGHT KNEE - COMPLETE 4+ VIEW COMPARISON:  MR RIGHT knee 12/10/2016 FINDINGS: Osseous demineralization. Joint space narrowing and spur formation greatest at the lateral compartment. Irregular sclerotic lesion within the proximal RIGHT fibular metaphysis likely an enchondroma measuring 2.7 cm in length. On the lateral view, a questionable cortical irregularity is seen at the posterior inferior margin of the proximal fibular lesion, cannot exclude subtle pathologic fracture. No additional fracture or dislocation. Small tibial tubercle spur. No knee joint effusion. IMPRESSION: Chronic chondroid lesion at the proximal RIGHT fibular metaphysis favor enchondroma 2.7 cm in length with questionable pathologic fracture at the posterior cortex on the lateral view; correlation for pain/tenderness at the proximal fibula is recommended. Electronically Signed   By: Lavonia Dana M.D.   On: 10/21/2017 17:35   Dg Abd Acute W/chest  Result Date: 10/22/2017 CLINICAL DATA:  Abdominal pain. Chronic hiatal hernia. History of breast carcinoma. EXAM: DG ABDOMEN ACUTE W/ 1V CHEST COMPARISON:  CT, 10/21/2017  FINDINGS: There is no bowel dilation to suggest obstruction. No free air. Hernia is not defined on the supine and erect AP views. There are numerous surgical vascular clips in the right central abdomen. No evidence of renal or ureteral stones. Cardiac silhouette is normal in size. There is opacity at the right lung base, likely atelectasis. There are prominent bronchovascular markings and areas of reticular scarring. Chronic bronchitic changes seen in the central lungs. No convincing pneumonia or pulmonary edema. Skeletal structures are demineralized. Old healed fracture of the proximal left humerus. Advanced arthropathic changes of the right hip. IMPRESSION: 1. No acute findings.  No evidence of bowel obstruction or free air. 2. No convincing acute cardiopulmonary disease. Opacity at the right lung base is likely atelectasis. This may be from the prominent cardiophrenic angle fat and evident on the previous day's CT. Electronically Signed   By: Lajean Manes M.D.   On: 10/22/2017 13:04    EKG:   Orders placed or performed in visit on 10/15/06  . EKG 12-Lead      Management plans discussed with the patient, family and they are in agreement.  CODE STATUS:     Code Status Orders  (From admission, onward)        Start     Ordered   10/21/17 2134  Full code  Continuous     10/21/17 2134    Code Status History    Date Active Date Inactive Code Status Order ID Comments User Context   12/13/2015 1638 12/20/2015 1710 Full Code [id number]  Florene Glen, MD ED   06/04/2015 0215 06/06/2015 1632 Full Code [id number]  Marlyce Huge, MD Inpatient    Advance Directive Documentation     Most Recent Value  Type of Advance Directive  Healthcare Power of Attorney  Pre-existing out of facility DNR order (yellow form or pink MOST form)  -  "MOST" Form in Place?  -      TOTAL TIME TAKING CARE OF THIS PATIENT: 35 minutes.    Vaughan Basta M.D on 10/22/2017 at 4:06 PM  Between 7am  to 6pm - Pager - [phone]  After 6pm go to [email or web address] - password [email or web address] [facility]  Office  [phone]  CC: Primary care physician; Adin Hector, MD   Note: This dictation was prepared with  Dragon dictation along with smaller Company secretary. Any transcriptional errors that result from this process are unintentional.

## 2017-10-22 NOTE — Discharge Instructions (Signed)
DO NOT take coumadin for next 3 days, then dose is decreased, check INR in 1 week with PMD

## 2017-10-22 NOTE — Consult Note (Signed)
Patient ID: [name], female   DOB: January 06, 1929, 84 y.o.   MRN: [id number]  HPI [name] is a 84 y.o. female asked to see in consultation by Dr. Anselm Jungling (case discussed with him in detail).  He was admitted yesterday for knee pain. He has multiple comorbidities including morbid obesity, on anticoagulation and her INR is supratherapeutic.  She had a history of recurrent ventral hernia repairs and the latest was performed by Dr. Burt Knack proximally 9 months ago.  They placed a round mesh.  Has significant mobility issues due to the orthopedic problems and super morbid obesity.  She reports that apparently there has been increase in the size of the hernia.  She is able to tolerate a regular diet she is not having any signs of obstruction and she denies any abdominal pain. CT scan personally reviewed there is evidence of a large hernia defect with small bowel within the sac.  No evidence of ischemia no evidence of obstruction.  I repeated a KUB this morning because there was a questionable incarceration per the radiology report and sure enough there is no evidence of obstruction no free air or acute abnormalities. INR 6.8, creat 1.2, 1.5 and white count of 13   HPI  Past Medical History:  Diagnosis Date  . Arthritis   . Basal cell carcinoma 2019   LLE  . Breast cancer ([place]) 2014   right- radiation. Dr Pat Patrick  . Diabetes mellitus without complication ([place])   . Gastric ulcer   . Glaucoma   . Hiatal hernia   . Hypertension   . Osteoporosis   . Recurrent deep vein thrombosis (DVT) ([facility])     Past Surgical History:  Procedure Laterality Date  . APPENDECTOMY    . BREAST BIOPSY Right 2014  . COLONOSCOPY  2013  . PARTIAL COLECTOMY     pre cancerous polyps/ over 10 years ago  . REPLACEMENT TOTAL KNEE    . TONSILLECTOMY    . VENTRAL HERNIA REPAIR N/A 06/04/2015   Procedure:  REPAIR of INCARCERATED VENTRAL HERNIA;  Surgeon: Marlyce Huge, MD;  Location: [facility] ORS;  Service: General;   Laterality: N/A;  . VENTRAL HERNIA REPAIR N/A 12/18/2015   Procedure: repair of recurent incarcerated ventral hernia with mesh;  Surgeon: Florene Glen, MD;  Location: [facility] ORS;  Service: General;  Laterality: N/A;    Family History  Problem Relation Age of Onset  . Diabetes Mother   . Hypertension Mother   . Deep vein thrombosis Mother   . Heart attack Father   . Breast cancer Neg Hx     Social History Social History   Tobacco Use  . Smoking status: Never Smoker  . Smokeless tobacco: Never Used  Substance Use Topics  . Alcohol use: No    Alcohol/week: 0.0 oz  . Drug use: No    Allergies  Allergen Reactions  . Metformin Diarrhea  . Oxycodone-Acetaminophen Nausea And Vomiting  . Oxycodone-Acetaminophen Nausea Only  . Amoxicillin Rash and Other (See Comments)    Has patient had a PCN reaction causing immediate rash, facial/tongue/throat swelling, SOB or lightheadedness with hypotension: No Has patient had a PCN reaction causing severe rash involving mucus membranes or skin necrosis: No Has patient had a PCN reaction that required hospitalization No Has patient had a PCN reaction occurring within the last 10 years: No If all of the above answers are "NO", then may proceed with Cephalosporin use.    Current Facility-Administered Medications  Medication Dose  Route Frequency Provider Last Rate Last Dose  . acetaminophen (TYLENOL) tablet 650 mg  650 mg Oral Q6H PRN Henreitta Leber, MD       Or  . acetaminophen (TYLENOL) suppository 650 mg  650 mg Rectal Q6H PRN Henreitta Leber, MD      . allopurinol (ZYLOPRIM) tablet 100 mg  100 mg Oral Daily Henreitta Leber, MD   100 mg at 10/22/17 0927  . aspirin EC tablet 81 mg  81 mg Oral QPM Sainani, Vivek J, MD      . atenolol (TENORMIN) tablet 25 mg  25 mg Oral QPM Henreitta Leber, MD   25 mg at 10/21/17 2331  . bisacodyl (DULCOLAX) suppository 10 mg  10 mg Rectal Once Vaughan Basta, MD      . calcium-vitamin D (OSCAL  WITH D) 500-200 MG-UNIT per tablet 1 tablet  1 tablet Oral BID Vaughan Basta, MD   1 tablet at 10/22/17 0927  . cholecalciferol (VITAMIN D) tablet 1,000 Units  1,000 Units Oral Daily Henreitta Leber, MD   1,000 Units at 10/22/17 0927  . docusate sodium (COLACE) capsule 100 mg  100 mg Oral QPM Henreitta Leber, MD      . Derrill Memo ON 10/25/2017] fluconazole (DIFLUCAN) tablet 150 mg  150 mg Oral Q7 days Henreitta Leber, MD      . folic acid (FOLVITE) tablet 1 mg  1 mg Oral QPM Sainani, Belia Heman, MD      . HYDROcodone-acetaminophen (NORCO/VICODIN) 5-325 MG per tablet 1-2 tablet  1-2 tablet Oral Q4H PRN Henreitta Leber, MD   2 tablet at 10/21/17 2329  . ketoconazole (NIZORAL) 2 % cream 1 application  1 application Topical BID Henreitta Leber, MD   1 application at 27/25/36 [phone]  . latanoprost (XALATAN) 0.005 % ophthalmic solution 1 drop  1 drop Both Eyes QHS Sainani, Vivek J, MD      . linagliptin (TRADJENTA) tablet 5 mg  5 mg Oral Daily Henreitta Leber, MD   5 mg at 10/21/17 2330  . lisinopril (PRINIVIL,ZESTRIL) tablet 30 mg  30 mg Oral BID Henreitta Leber, MD   30 mg at 10/22/17 0927  . multivitamin with minerals tablet 1 tablet  1 tablet Oral Daily Henreitta Leber, MD   1 tablet at 10/22/17 0927  . ondansetron (ZOFRAN) tablet 4 mg  4 mg Oral Q6H PRN Henreitta Leber, MD       Or  . ondansetron (ZOFRAN) injection 4 mg  4 mg Intravenous Q6H PRN Sainani, Belia Heman, MD      . pantoprazole (PROTONIX) EC tablet 40 mg  40 mg Oral Daily Sainani, Vivek J, MD      . pioglitazone (ACTOS) tablet 30 mg  30 mg Oral Daily Henreitta Leber, MD   30 mg at 10/22/17 0927  . traMADol (ULTRAM) tablet 50 mg  50 mg Oral Q6H PRN Henreitta Leber, MD      . vitamin C (ASCORBIC ACID) tablet 1,000 mg  1,000 mg Oral Daily Henreitta Leber, MD   1,000 mg at 10/22/17 3474     Review of Systems Full ROS  was asked and was negative except for the information on the HPI  Physical Exam Blood pressure (!) 124/52,  pulse 70, temperature 98.3 F (36.8 C), temperature source Oral, resp. rate 20, height 5\' 4"  (1.626 m), weight 113.4 kg (250 lb), SpO2 95 %. CONSTITUTIONAL: Morbidly obese female in NAD EYES:  Pupils are equal, round, and reactive to light, Sclera are non-icteric. EARS, NOSE, MOUTH AND THROAT: The oropharynx is clear. The oral mucosa is pink and moist. Hearing is intact to voice. LYMPH NODES:  Lymph nodes in the neck are normal. RESPIRATORY:  Lungs are clear. There is normal respiratory effort, with equal breath sounds bilaterally, and without pathologic use of accessory muscles. CARDIOVASCULAR: Heart is regular without murmurs, gallops, or rubs. GI: The abdomen is soft, large pannus and large reducible ventral hernia.  No evidence of peritonitis no evidence of tenderness to palpation.  There is significant loss of domain within the abdominal wall  GU: Rectal deferred.   MUSCULOSKELETAL: Normal muscle strength and tone. No cyanosis or edema.   SKIN: Turgor is good and there are no pathologic skin lesions or ulcers. NEUROLOGIC: Motor and sensation is grossly normal. Cranial nerves are grossly intact. PSYCH:  Oriented to person, place and time. Affect is normal.  Data Reviewed  I have personally reviewed the patient's imaging, laboratory findings and medical records.    Assessment/ Plan 84 year old morbidly obese female with multiple comorbidities now anticoagulated with a symptomatic recurrent ventral hernia.  There is no evidence of incarceration there is no evidence of bowel obstruction.  She is clinically doing well.  Discussed with the patient in detail.  Given all her mobility issues and comorbidities I will not advise any major surgical intervention that will be either an abdominal wall reconstruction with component separation.  I do not think she will be able to tolerate this large procedure and will be more harmful to try to perform this on an elderly debilitated a morbidly obese female.   Discussed my thought process with her and she understands. No Need for emergent surgical intervention  Caroleen Hamman, MD FACS General Surgeon 10/22/2017, 1:32 PM

## 2017-10-22 NOTE — Progress Notes (Signed)
[place] [facility] [place] NAME: [name]    MR#:  [id number]  DATE OF BIRTH:  02-13-1929  SUBJECTIVE:  CHIEF COMPLAINT:   Chief Complaint  Patient presents with  . Abdominal Pain  . Knee Pain   K with complaint of pain in abdomen and knee, very poor functional status at home.  REVIEW OF SYSTEMS:  CONSTITUTIONAL: No fever,Positive fatigue or weakness.  EYES: No blurred or double vision.  EARS, NOSE, AND THROAT: No tinnitus or ear pain.  RESPIRATORY: No cough, shortness of breath, wheezing or hemoptysis.  CARDIOVASCULAR: No chest pain, orthopnea, edema.  GASTROINTESTINAL: No nausea, vomiting, diarrhea or abdominal pain.  GENITOURINARY: No dysuria, hematuria.  ENDOCRINE: No polyuria, nocturia,  HEMATOLOGY: No anemia, easy bruising or bleeding SKIN: No rash or lesion. MUSCULOSKELETAL: No joint pain or arthritis.   NEUROLOGIC: No tingling, numbness, weakness.  PSYCHIATRY: No anxiety or depression.   ROS  DRUG ALLERGIES:   Allergies  Allergen Reactions  . Metformin Diarrhea  . Oxycodone-Acetaminophen Nausea And Vomiting  . Oxycodone-Acetaminophen Nausea Only  . Amoxicillin Rash and Other (See Comments)    Has patient had a PCN reaction causing immediate rash, facial/tongue/throat swelling, SOB or lightheadedness with hypotension: No Has patient had a PCN reaction causing severe rash involving mucus membranes or skin necrosis: No Has patient had a PCN reaction that required hospitalization No Has patient had a PCN reaction occurring within the last 10 years: No If all of the above answers are "NO", then may proceed with Cephalosporin use.    VITALS:  Blood pressure (!) 124/52, pulse 70, temperature 98.3 F (36.8 C), temperature source Oral, resp. rate 20, height 5\' 4"  (1.626 m), weight 113.4 kg (250 lb), SpO2 95 %.  PHYSICAL EXAMINATION:   GENERAL:84 y.o.-year-oldobesepatient lying in the bedcrying and complaining of  abdominal/knee pain.  EYES: Pupils equal, round, reactive to light and accommodation. No scleral icterus. Extraocular muscles intact.  HEENT: Head atraumatic, normocephalic. Oropharynx and nasopharynx clear. No oropharyngeal erythema, moist oral mucosa  NECK: Supple, no jugular venous distention. No thyroid enlargement, no tenderness.  LUNGS: Normal breath sounds bilaterally, no wheezing, rales, rhonchi. No use of accessory muscles of respiration.  CARDIOVASCULAR: S1, S2 RRR. No murmurs, rubs, gallops, clicks.  ABDOMEN: Soft, nontender, nondistended. Bowel sounds present. No organomegaly or mass.Ventral hernia which is easily reducible. EXTREMITIES: No pedal edema, cyanosis, or clubbing. + 2 pedal &radial pulses b/l.  NEUROLOGIC: Cranial nerves II through XII are intact. No focal Motor or sensory deficits appreciated b/lglobally weak PSYCHIATRIC: The patient is alert and oriented x 3.  SKIN: No lesion, or ulcer.Fungal rash noted with foul-smelling in the lower abdomen area in the inguinal area.   Physical Exam LABORATORY PANEL:   CBC Recent Labs  Lab 10/21/17 1646  WBC 13.2*  HGB 11.5*  HCT 34.5*  PLT 356   ------------------------------------------------------------------------------------------------------------------  Chemistries  Recent Labs  Lab 10/21/17 1646  NA 136  K 4.2  CL 105  CO2 23  GLUCOSE 161*  BUN 40*  CREATININE 1.28*  CALCIUM 9.2  AST 26  ALT 19  ALKPHOS 90  BILITOT 0.5   ------------------------------------------------------------------------------------------------------------------  Cardiac Enzymes No results for input(s): TROPONINI in the last 168 hours. ------------------------------------------------------------------------------------------------------------------  RADIOLOGY:  Ct Knee Right Wo Contrast  Result Date: 10/21/2017 CLINICAL DATA:  Knee pain.  Followup abnormal x-rays. EXAM: CT OF THE right KNEE WITHOUT CONTRAST  TECHNIQUE: Multidetector CT imaging of the right knee was performed according to  the standard protocol. Multiplanar CT image reconstructions were also generated. COMPARISON:  Radiographs 10/21/2017 FINDINGS: Tricompartmental degenerative changes with joint space narrowing and spurring. This is most significant in the lateral compartment. No acute fracture.  No osteochondral lesion. There is a chondroid lesion in the fibular metaphysis. This is likely benign but does abut the cortex. No definite fracture is identified. Small amount of joint fluid without overt joint effusion. Fatty atrophy of the thigh and calf musculature. IMPRESSION: 1. Probably benign enchondroma involving the fibular metaphysis. Given the patient's age I would just follow this radiographically. No definite pathologic fracture involving the fibula. 2. Tricompartmental degenerative changes but no acute bony abnormality. Electronically Signed   By: Marijo Sanes M.D.   On: 10/21/2017 18:44   Ct Abdomen Pelvis W Contrast  Result Date: 10/21/2017 CLINICAL DATA:  Right lower quadrant abdominal pain.  Known hernia. EXAM: CT ABDOMEN AND PELVIS WITH CONTRAST TECHNIQUE: Multidetector CT imaging of the abdomen and pelvis was performed using the standard protocol following bolus administration of intravenous contrast. CONTRAST:  72mL ISOVUE-300 IOPAMIDOL (ISOVUE-300) INJECTION 61% COMPARISON:  12/13/2015 FINDINGS: Lower chest: Stable emphysematous changes and pulmonary scarring. No pleural effusion. The heart is enlarged. Stable aortic and coronary artery calcifications. Hepatobiliary: No focal hepatic lesions or intrahepatic biliary dilatation. The gallbladder is distended and contains sludge and gallstones. No definite findings for acute cholecystitis. Normal caliber common bile duct. Pancreas: No mass, inflammation or ductal dilatation. Spleen: Normal size.  No focal lesions. Adrenals/Urinary Tract: Adrenal glands are normal. No significant renal  findings. The bladder is normal. Stomach/Bowel: Evidence of previous bowel surgery with a right hemicolectomy. There is an anterior abdominal wall hernia containing small bowel loops. Some of these loops are dilated and demonstrate air-fluid levels. Marked narrowing of adjacent bowel loops worrisome for obstruction/incarceration. There is interstitial changes and edema around these bowel loops. The stomach, duodenum and proximal small bowel loops appear normal. The colon the remarkable.  Moderate sigmoid diverticulosis. Vascular/Lymphatic: Stable aortic calcifications without focal aneurysm or dissection. The branch vessels are patent. The major venous structures are patent. No mesenteric or retroperitoneal mass or adenopathy. Reproductive: The uterus and ovaries are unremarkable. Other: No inguinal mass or adenopathy. Musculoskeletal: No acute or significant bony findings. Advanced degenerative changes involving the spine. IMPRESSION: 1. Lower anterior abdominal wall hernia with findings suspicious for incarceration and early obstruction of the small bowel loops. 2. Distended gallbladder with gallstones and sludge but no definite findings for acute cholecystitis. Electronically Signed   By: Marijo Sanes M.D.   On: 10/21/2017 18:37   Dg Knee Complete 4 Views Right  Result Date: 10/21/2017 CLINICAL DATA:  RIGHT knee pain EXAM: RIGHT KNEE - COMPLETE 4+ VIEW COMPARISON:  MR RIGHT knee 12/10/2016 FINDINGS: Osseous demineralization. Joint space narrowing and spur formation greatest at the lateral compartment. Irregular sclerotic lesion within the proximal RIGHT fibular metaphysis likely an enchondroma measuring 2.7 cm in length. On the lateral view, a questionable cortical irregularity is seen at the posterior inferior margin of the proximal fibular lesion, cannot exclude subtle pathologic fracture. No additional fracture or dislocation. Small tibial tubercle spur. No knee joint effusion. IMPRESSION: Chronic  chondroid lesion at the proximal RIGHT fibular metaphysis favor enchondroma 2.7 cm in length with questionable pathologic fracture at the posterior cortex on the lateral view; correlation for pain/tenderness at the proximal fibula is recommended. Electronically Signed   By: Lavonia Dana M.D.   On: 10/21/2017 17:35   Dg Abd Acute W/chest  Result Date: 10/22/2017  CLINICAL DATA:  Abdominal pain. Chronic hiatal hernia. History of breast carcinoma. EXAM: DG ABDOMEN ACUTE W/ 1V CHEST COMPARISON:  CT, 10/21/2017 FINDINGS: There is no bowel dilation to suggest obstruction. No free air. Hernia is not defined on the supine and erect AP views. There are numerous surgical vascular clips in the right central abdomen. No evidence of renal or ureteral stones. Cardiac silhouette is normal in size. There is opacity at the right lung base, likely atelectasis. There are prominent bronchovascular markings and areas of reticular scarring. Chronic bronchitic changes seen in the central lungs. No convincing pneumonia or pulmonary edema. Skeletal structures are demineralized. Old healed fracture of the proximal left humerus. Advanced arthropathic changes of the right hip. IMPRESSION: 1. No acute findings.  No evidence of bowel obstruction or free air. 2. No convincing acute cardiopulmonary disease. Opacity at the right lung base is likely atelectasis. This may be from the prominent cardiophrenic angle fat and evident on the previous day's CT. Electronically Signed   By: Lajean Manes M.D.   On: 10/22/2017 13:04    ASSESSMENT AND PLAN:   Active Problems:   Recurrent ventral hernia   Left knee pain  84 year old female with past medical history ofknown history ofmorbid obesity, hypertension, chronic hiatal hernia, glaucoma, history of breast cancer, diabetes, osteoporosis,chronic abdominal pain who presents to the hospital due to significant left knee pain and also abdominal pain.  1. Left knee pain-etiology unclear  suspected to be secondary to underlying chronic osteoarthritis with   a benign enchondroma that was noted on the CT on admission. - Patient is being admitted for pain control as she is having difficulty in voiding and bearing weight on   that leg.  -on further discussion with her son, patient does not get up and walk much, her husband and son helps her at home but they are requesting to get more services. - Spoke with case managerAbout arrangements of home health, physical therapy, visiting nurse   2.Hx ofrecurrent DVT-held Coumadin, INR is currently supratherapeutic   monitor.  3. History of glaucoma-continue latanoprost eyedrops.  4. Essential hypertension-continue lisinopril atenolol.  5. History of diabetes-continue Januvia, Actos.  6. GERD-continue Protonix.  7. Fungal rash-continue fluconazole, ketoconazole topical  8.rectal bleed- after bowel movement    Likely hemorrhoidal, but patient's INR is 7 so I would like to monitor and follow serial hemoglobin.   All the records are reviewed and case discussed with Care Management/Social Workerr. Management plans discussed with the patient, family and they are in agreement.  CODE STATUS: Full.  TOTAL TIME TAKING CARE OF THIS PATIENT: 35 minutes.    POSSIBLE D/C IN 1-2 DAYS, DEPENDING ON CLINICAL CONDITION.   Vaughan Basta M.D on 10/22/2017   Between 7am to 6pm - Pager - [phone]  After 6pm go to [email or web address] - password [email or web address] [facility]  Office  [phone]  CC: Primary care physician; Adin Hector, MD  Note: This dictation was prepared with Dragon dictation along with smaller phrase technology. Any transcriptional errors that result from this process are unintentional.

## 2017-10-22 NOTE — Care Management Note (Signed)
Case Management Note  Patient Details  Name: [name] MRN: [id number] Date of Birth: 1929/04/29   Brittney with [facility] notified of discharge.  Home health to check INR outpatient.  RNCM signing off.   Subjective/Objective:                    Action/Plan:   Expected Discharge Date:  10/22/17               Expected Discharge Plan:  [place]  In-House Referral:     Discharge planning Services     Post Acute Care Choice:  Resumption of Svcs/PTA Provider Choice offered to:     DME Arranged:    DME Agency:     HH Arranged:  RN, PT, Nurse's Aide [place] Agency:  [facility]  Status of Service:  Completed, signed off  If discussed at [place] of Stay Meetings, dates discussed:    Additional Comments:  Beverly Sessions, RN 10/22/2017, 4:32 PM

## 2017-10-22 NOTE — Progress Notes (Signed)
PT Cancellation Note  Patient Details Name: [name] MRN: [id number] DOB: Nov 15, 1928   Cancelled Treatment:    Reason Eval/Treat Not Completed: Other (comment). Consult received and chart reviewed. Initially INR at 6.86 and rechecked this AM at 7.10. Currently pt is not appropriate for PT evaluation. Will hold at this time and re-attempt next date.   Vernal Hritz 10/22/2017, 10:36 AM  Greggory Stallion, PT, DPT [phone]

## 2017-10-22 NOTE — Care Management Obs Status (Signed)
[place] NOTIFICATION   Patient Details  Name: [name] MRN: [id number] Date of Birth: October 12, 1928   Medicare Observation Status Notification Given:  No(admitted obs less than 24 hours)    Beverly Sessions, RN 10/22/2017, 4:33 PM

## 2017-10-23 DIAGNOSIS — K432 Incisional hernia without obstruction or gangrene: Secondary | ICD-10-CM | POA: Diagnosis present

## 2017-10-23 DIAGNOSIS — Z7984 Long term (current) use of oral hypoglycemic drugs: Secondary | ICD-10-CM | POA: Diagnosis not present

## 2017-10-23 DIAGNOSIS — M810 Age-related osteoporosis without current pathological fracture: Secondary | ICD-10-CM | POA: Diagnosis present

## 2017-10-23 DIAGNOSIS — Z7982 Long term (current) use of aspirin: Secondary | ICD-10-CM | POA: Diagnosis not present

## 2017-10-23 DIAGNOSIS — E119 Type 2 diabetes mellitus without complications: Secondary | ICD-10-CM | POA: Diagnosis present

## 2017-10-23 DIAGNOSIS — Z7901 Long term (current) use of anticoagulants: Secondary | ICD-10-CM | POA: Diagnosis not present

## 2017-10-23 DIAGNOSIS — H409 Unspecified glaucoma: Secondary | ICD-10-CM | POA: Diagnosis present

## 2017-10-23 DIAGNOSIS — Z85828 Personal history of other malignant neoplasm of skin: Secondary | ICD-10-CM | POA: Diagnosis not present

## 2017-10-23 DIAGNOSIS — K219 Gastro-esophageal reflux disease without esophagitis: Secondary | ICD-10-CM | POA: Diagnosis present

## 2017-10-23 DIAGNOSIS — D1621 Benign neoplasm of long bones of right lower limb: Secondary | ICD-10-CM | POA: Diagnosis present

## 2017-10-23 DIAGNOSIS — K449 Diaphragmatic hernia without obstruction or gangrene: Secondary | ICD-10-CM | POA: Diagnosis present

## 2017-10-23 DIAGNOSIS — Z880 Allergy status to penicillin: Secondary | ICD-10-CM | POA: Diagnosis not present

## 2017-10-23 DIAGNOSIS — Z6841 Body Mass Index (BMI) 40.0 and over, adult: Secondary | ICD-10-CM | POA: Diagnosis not present

## 2017-10-23 DIAGNOSIS — M1711 Unilateral primary osteoarthritis, right knee: Secondary | ICD-10-CM | POA: Diagnosis present

## 2017-10-23 DIAGNOSIS — I10 Essential (primary) hypertension: Secondary | ICD-10-CM | POA: Diagnosis present

## 2017-10-23 DIAGNOSIS — B369 Superficial mycosis, unspecified: Secondary | ICD-10-CM | POA: Diagnosis present

## 2017-10-23 DIAGNOSIS — G8929 Other chronic pain: Secondary | ICD-10-CM | POA: Diagnosis present

## 2017-10-23 DIAGNOSIS — D689 Coagulation defect, unspecified: Secondary | ICD-10-CM | POA: Diagnosis present

## 2017-10-23 DIAGNOSIS — Z96652 Presence of left artificial knee joint: Secondary | ICD-10-CM | POA: Diagnosis present

## 2017-10-23 DIAGNOSIS — K649 Unspecified hemorrhoids: Secondary | ICD-10-CM | POA: Diagnosis present

## 2017-10-23 DIAGNOSIS — D684 Acquired coagulation factor deficiency: Secondary | ICD-10-CM | POA: Diagnosis present

## 2017-10-23 DIAGNOSIS — Z853 Personal history of malignant neoplasm of breast: Secondary | ICD-10-CM | POA: Diagnosis not present

## 2017-10-23 DIAGNOSIS — Z79818 Long term (current) use of other agents affecting estrogen receptors and estrogen levels: Secondary | ICD-10-CM | POA: Diagnosis not present

## 2017-10-23 DIAGNOSIS — Z86718 Personal history of other venous thrombosis and embolism: Secondary | ICD-10-CM | POA: Diagnosis not present

## 2017-10-23 LAB — CBC
HCT: 30.8 % — ABNORMAL LOW (ref 35.0–47.0)
Hemoglobin: 10.4 g/dL — ABNORMAL LOW (ref 12.0–16.0)
MCH: 32.3 pg (ref 26.0–34.0)
MCHC: 33.9 g/dL (ref 32.0–36.0)
MCV: 95.3 fL (ref 80.0–100.0)
Platelets: 282 10*3/uL (ref 150–440)
RBC: 3.23 MIL/uL — ABNORMAL LOW (ref 3.80–5.20)
RDW: 14.9 % — AB (ref 11.5–14.5)
WBC: 8.7 10*3/uL (ref 3.6–11.0)

## 2017-10-23 LAB — HEMOGLOBIN
Hemoglobin: 10.9 g/dL — ABNORMAL LOW (ref 12.0–16.0)
Hemoglobin: 10.8 g/dL — ABNORMAL LOW (ref 12.0–16.0)

## 2017-10-23 LAB — PROTIME-INR
INR: 8.26 — AB
Prothrombin Time: 68.2 seconds — ABNORMAL HIGH (ref 11.4–15.2)

## 2017-10-23 MED ORDER — BISACODYL 10 MG RE SUPP
10.0000 mg | Freq: Once | RECTAL | Status: AC
  Administered 2017-10-24: 10 mg via RECTAL
  Filled 2017-10-23: qty 1

## 2017-10-23 MED ORDER — HYDROCODONE-ACETAMINOPHEN 5-325 MG PO TABS
1.0000 | ORAL_TABLET | ORAL | Status: DC | PRN
  Administered 2017-10-23 – 2017-10-24 (×5): 1 via ORAL
  Filled 2017-10-23 (×5): qty 1

## 2017-10-23 MED ORDER — PHYTONADIONE 5 MG PO TABS
5.0000 mg | ORAL_TABLET | Freq: Once | ORAL | Status: AC
  Administered 2017-10-23: 5 mg via ORAL
  Filled 2017-10-23: qty 1

## 2017-10-23 MED ORDER — SENNOSIDES-DOCUSATE SODIUM 8.6-50 MG PO TABS
1.0000 | ORAL_TABLET | Freq: Two times a day (BID) | ORAL | Status: DC
  Administered 2017-10-24: 1 via ORAL
  Filled 2017-10-23 (×2): qty 1

## 2017-10-23 NOTE — Progress Notes (Signed)
ANTICOAGULATION CONSULT NOTE - Initial Consult  Pharmacy Consult for warfarin Indication: history of recurrent DVT   Allergies  Allergen Reactions  . Metformin Diarrhea  . Oxycodone-Acetaminophen Nausea And Vomiting  . Oxycodone-Acetaminophen Nausea Only  . Amoxicillin Rash and Other (See Comments)    Has patient had a PCN reaction causing immediate rash, facial/tongue/throat swelling, SOB or lightheadedness with hypotension: No Has patient had a PCN reaction causing severe rash involving mucus membranes or skin necrosis: No Has patient had a PCN reaction that required hospitalization No Has patient had a PCN reaction occurring within the last 10 years: No If all of the above answers are "NO", then may proceed with Cephalosporin use.    Patient Measurements: Height: 5\' 4"  (162.6 cm) Weight: 250 lb (113.4 kg) IBW/kg (Calculated) : 54.7  Vital Signs: Temp: 98 F (36.7 C) (04/13 0348) Temp Source: Oral (04/13 0348) BP: 133/46 (04/13 0352) Pulse Rate: 72 (04/13 0352)  Labs: Recent Labs    10/21/17 1646 10/22/17 0619 10/22/17 1729 10/23/17 0157  HGB 11.5*  --  11.2* 10.4*  HCT 34.5*  --   --  30.8*  PLT 356  --   --  282  LABPROT 59.0* 60.6*  --  68.2*  INR 6.86* 7.10*  --  8.26*  CREATININE 1.28*  --   --   --     Estimated Creatinine Clearance: 38.9 mL/min (A) (by C-G formula based on SCr of 1.28 mg/dL (H)).  Assessment: Pharmacy consulted to dose and monitor warfarin in this 84 year old female who was taking warfarin PTA for a history of recurrent DVT.   INR = 6.9 on admission  Goal of Therapy:  INR 2-3 Monitor platelets by anticoagulation protocol: Yes   Plan:  INR supratherapeutic and increasing. Hemoglobin stable. MD ordered oral vitamin K 5mg  x 1. Will continue to hold warfarin, recheck INR and CBC with AM labs.   Patient on oral fluconazole for fungal rash, likely cause of INR elevation. Will continue to monitor.   Cheri Guppy, PharmD,  BCPS Clinical Pharmacist 10/23/2017,8:33 AM

## 2017-10-23 NOTE — Progress Notes (Signed)
PT Cancellation Note  Patient Details Name: [name] MRN: [id number] DOB: 1928-12-21   Cancelled Treatment:    Reason Eval/Treat Not Completed: Medical issues which prohibited therapy.  INR is over 8 and will hold for now due to being outside acceptable parameters for PT visit.  Ck later as time and pt allow.   Ramond Dial 10/23/2017, 8:41 AM   Mee Hives, PT MS Acute Rehab Dept. Number: [place] [facility] [place]

## 2017-10-23 NOTE — Care Management (Signed)
Patient has discharge order from 10/22/17 however INR is ~ 8. RNCM attempted to update [name] with [facility] home health [phone] however this number is not working.  RNCM will follow. RN updated. Sticky note entered for covering MD.

## 2017-10-23 NOTE — Progress Notes (Signed)
[facility] [place] NAME: [name]    MR#:  [id number]  DATE OF BIRTH:  02-08-1929  SUBJECTIVE:  CHIEF COMPLAINT:   Chief Complaint  Patient presents with  . Abdominal Pain  . Knee Pain   K with complaint of pain in abdomen and knee, very poor functional status at home.  Has some streak of bloods with her bowel movements since yesterday, hemoglobin is stable.  REVIEW OF SYSTEMS:  CONSTITUTIONAL: No fever,Positive fatigue or weakness.  EYES: No blurred or double vision.  EARS, NOSE, AND THROAT: No tinnitus or ear pain.  RESPIRATORY: No cough, shortness of breath, wheezing or hemoptysis.  CARDIOVASCULAR: No chest pain, orthopnea, edema.  GASTROINTESTINAL: No nausea, vomiting, diarrhea or abdominal pain.  GENITOURINARY: No dysuria, hematuria.  ENDOCRINE: No polyuria, nocturia,  HEMATOLOGY: No anemia, easy bruising or bleeding SKIN: No rash or lesion. MUSCULOSKELETAL: No joint pain or arthritis.   NEUROLOGIC: No tingling, numbness, weakness.  PSYCHIATRY: No anxiety or depression.   ROS  DRUG ALLERGIES:   Allergies  Allergen Reactions  . Metformin Diarrhea  . Oxycodone-Acetaminophen Nausea And Vomiting  . Oxycodone-Acetaminophen Nausea Only  . Amoxicillin Rash and Other (See Comments)    Has patient had a PCN reaction causing immediate rash, facial/tongue/throat swelling, SOB or lightheadedness with hypotension: No Has patient had a PCN reaction causing severe rash involving mucus membranes or skin necrosis: No Has patient had a PCN reaction that required hospitalization No Has patient had a PCN reaction occurring within the last 10 years: No If all of the above answers are "NO", then may proceed with Cephalosporin use.    VITALS:  Blood pressure (!) 133/46, pulse 72, temperature 98 F (36.7 C), temperature source Oral, resp. rate (!) 22, height 5\' 4"  (1.626 m), weight 113.4 kg (250 lb), SpO2 96 %.  PHYSICAL EXAMINATION:    GENERAL:84 y.o.-year-oldobesepatient lying in the bedcrying and complaining of abdominal/knee pain.  EYES: Pupils equal, round, reactive to light and accommodation. No scleral icterus. Extraocular muscles intact.  HEENT: Head atraumatic, normocephalic. Oropharynx and nasopharynx clear. No oropharyngeal erythema, moist oral mucosa  NECK: Supple, no jugular venous distention. No thyroid enlargement, no tenderness.  LUNGS: Normal breath sounds bilaterally, no wheezing, rales, rhonchi. No use of accessory muscles of respiration.  CARDIOVASCULAR: S1, S2 RRR. No murmurs, rubs, gallops, clicks.  ABDOMEN: Soft, nontender, nondistended. Bowel sounds present. No organomegaly or mass.Ventral hernia which is easily reducible. EXTREMITIES: No pedal edema, cyanosis, or clubbing. + 2 pedal &radial pulses b/l.  NEUROLOGIC: Cranial nerves II through XII are intact. No focal Motor or sensory deficits appreciated b/lglobally weak PSYCHIATRIC: The patient is alert and oriented x 3.  SKIN: No lesion, or ulcer.Fungal rash noted with foul-smelling in the lower abdomen area in the inguinal area.   Physical Exam LABORATORY PANEL:   CBC Recent Labs  Lab 10/23/17 0157 10/23/17 0852  WBC 8.7  --   HGB 10.4* 10.9*  HCT 30.8*  --   PLT 282  --    ------------------------------------------------------------------------------------------------------------------  Chemistries  Recent Labs  Lab 10/21/17 1646  NA 136  K 4.2  CL 105  CO2 23  GLUCOSE 161*  BUN 40*  CREATININE 1.28*  CALCIUM 9.2  AST 26  ALT 19  ALKPHOS 90  BILITOT 0.5   ------------------------------------------------------------------------------------------------------------------  Cardiac Enzymes No results for input(s): TROPONINI in the last 168 hours. ------------------------------------------------------------------------------------------------------------------  RADIOLOGY:  Ct Knee Right Wo Contrast  Result  Date: 10/21/2017 CLINICAL  DATA:  Knee pain.  Followup abnormal x-rays. EXAM: CT OF THE right KNEE WITHOUT CONTRAST TECHNIQUE: Multidetector CT imaging of the right knee was performed according to the standard protocol. Multiplanar CT image reconstructions were also generated. COMPARISON:  Radiographs 10/21/2017 FINDINGS: Tricompartmental degenerative changes with joint space narrowing and spurring. This is most significant in the lateral compartment. No acute fracture.  No osteochondral lesion. There is a chondroid lesion in the fibular metaphysis. This is likely benign but does abut the cortex. No definite fracture is identified. Small amount of joint fluid without overt joint effusion. Fatty atrophy of the thigh and calf musculature. IMPRESSION: 1. Probably benign enchondroma involving the fibular metaphysis. Given the patient's age I would just follow this radiographically. No definite pathologic fracture involving the fibula. 2. Tricompartmental degenerative changes but no acute bony abnormality. Electronically Signed   By: Marijo Sanes M.D.   On: 10/21/2017 18:44   Ct Abdomen Pelvis W Contrast  Result Date: 10/21/2017 CLINICAL DATA:  Right lower quadrant abdominal pain.  Known hernia. EXAM: CT ABDOMEN AND PELVIS WITH CONTRAST TECHNIQUE: Multidetector CT imaging of the abdomen and pelvis was performed using the standard protocol following bolus administration of intravenous contrast. CONTRAST:  49mL ISOVUE-300 IOPAMIDOL (ISOVUE-300) INJECTION 61% COMPARISON:  12/13/2015 FINDINGS: Lower chest: Stable emphysematous changes and pulmonary scarring. No pleural effusion. The heart is enlarged. Stable aortic and coronary artery calcifications. Hepatobiliary: No focal hepatic lesions or intrahepatic biliary dilatation. The gallbladder is distended and contains sludge and gallstones. No definite findings for acute cholecystitis. Normal caliber common bile duct. Pancreas: No mass, inflammation or ductal dilatation.  Spleen: Normal size.  No focal lesions. Adrenals/Urinary Tract: Adrenal glands are normal. No significant renal findings. The bladder is normal. Stomach/Bowel: Evidence of previous bowel surgery with a right hemicolectomy. There is an anterior abdominal wall hernia containing small bowel loops. Some of these loops are dilated and demonstrate air-fluid levels. Marked narrowing of adjacent bowel loops worrisome for obstruction/incarceration. There is interstitial changes and edema around these bowel loops. The stomach, duodenum and proximal small bowel loops appear normal. The colon the remarkable.  Moderate sigmoid diverticulosis. Vascular/Lymphatic: Stable aortic calcifications without focal aneurysm or dissection. The branch vessels are patent. The major venous structures are patent. No mesenteric or retroperitoneal mass or adenopathy. Reproductive: The uterus and ovaries are unremarkable. Other: No inguinal mass or adenopathy. Musculoskeletal: No acute or significant bony findings. Advanced degenerative changes involving the spine. IMPRESSION: 1. Lower anterior abdominal wall hernia with findings suspicious for incarceration and early obstruction of the small bowel loops. 2. Distended gallbladder with gallstones and sludge but no definite findings for acute cholecystitis. Electronically Signed   By: Marijo Sanes M.D.   On: 10/21/2017 18:37   Dg Knee Complete 4 Views Right  Result Date: 10/21/2017 CLINICAL DATA:  RIGHT knee pain EXAM: RIGHT KNEE - COMPLETE 4+ VIEW COMPARISON:  MR RIGHT knee 12/10/2016 FINDINGS: Osseous demineralization. Joint space narrowing and spur formation greatest at the lateral compartment. Irregular sclerotic lesion within the proximal RIGHT fibular metaphysis likely an enchondroma measuring 2.7 cm in length. On the lateral view, a questionable cortical irregularity is seen at the posterior inferior margin of the proximal fibular lesion, cannot exclude subtle pathologic fracture. No  additional fracture or dislocation. Small tibial tubercle spur. No knee joint effusion. IMPRESSION: Chronic chondroid lesion at the proximal RIGHT fibular metaphysis favor enchondroma 2.7 cm in length with questionable pathologic fracture at the posterior cortex on the lateral view; correlation for pain/tenderness at the  proximal fibula is recommended. Electronically Signed   By: Lavonia Dana M.D.   On: 10/21/2017 17:35   Dg Abd Acute W/chest  Result Date: 10/22/2017 CLINICAL DATA:  Abdominal pain. Chronic hiatal hernia. History of breast carcinoma. EXAM: DG ABDOMEN ACUTE W/ 1V CHEST COMPARISON:  CT, 10/21/2017 FINDINGS: There is no bowel dilation to suggest obstruction. No free air. Hernia is not defined on the supine and erect AP views. There are numerous surgical vascular clips in the right central abdomen. No evidence of renal or ureteral stones. Cardiac silhouette is normal in size. There is opacity at the right lung base, likely atelectasis. There are prominent bronchovascular markings and areas of reticular scarring. Chronic bronchitic changes seen in the central lungs. No convincing pneumonia or pulmonary edema. Skeletal structures are demineralized. Old healed fracture of the proximal left humerus. Advanced arthropathic changes of the right hip. IMPRESSION: 1. No acute findings.  No evidence of bowel obstruction or free air. 2. No convincing acute cardiopulmonary disease. Opacity at the right lung base is likely atelectasis. This may be from the prominent cardiophrenic angle fat and evident on the previous day's CT. Electronically Signed   By: Lajean Manes M.D.   On: 10/22/2017 13:04    ASSESSMENT AND PLAN:   Active Problems:   Recurrent ventral hernia   Left knee pain   Coagulopathy ([place])  84 year old female with past medical history ofknown history ofmorbid obesity, hypertension, chronic hiatal hernia, glaucoma, history of breast cancer, diabetes, osteoporosis,chronic abdominal pain who  presents to the hospital due to significant left knee pain and also abdominal pain.  1. Left knee pain-etiology unclear suspected to be secondary to underlying chronic osteoarthritis with   a benign enchondroma that was noted on the CT on admission. - Patient is being admitted for pain control as she is having difficulty in voiding and bearing weight on   that leg.  -on further discussion with her son, patient does not get up and walk much, her husband and son helps her at home but they are requesting to get more services. - Spoke with case managerAbout arrangements of home health, physical therapy, visiting nurse  - Cont hydrocodone for pain.  2.Acquired Coagulopathy  Hx ofrecurrent DVT-held Coumadin, INR is currently supratherapeutic   monitor.   INR is rising and more than 8 today.   I will give oral vitamin K to help control.  3. History of glaucoma-continue latanoprost eyedrops.  4. Essential hypertension-continue lisinopril atenolol.  5. History of diabetes-continue Januvia, Actos.  6. GERD-continue Protonix.  7. Fungal rash-continue fluconazole, ketoconazole topical  8.rectal bleed- after bowel movement    Likely hemorrhoidal, but patient's INR is 8 so I would like to monitor and follow serial hemoglobin.   Hemoglobin is stable, so if INR comes under control and patient is stable in next 1-2 days, she may not need any further workup for this.   All the records are reviewed and case discussed with Care Management/Social Workerr. Management plans discussed with the patient, family and they are in agreement.  CODE STATUS: Full.  TOTAL TIME TAKING CARE OF THIS PATIENT: 35 minutes.  Discussed with her husband in the room.  POSSIBLE D/C IN 1-2 DAYS, DEPENDING ON CLINICAL CONDITION.   Vaughan Basta M.D on 10/23/2017   Between 7am to 6pm - Pager - [phone]  After 6pm go to [email or web address] - password [email or web address] [facility]   Office  [phone]  CC: Primary care physician; Adin Hector, MD  Note: This dictation was prepared with Dragon dictation along with smaller phrase technology. Any transcriptional errors that result from this process are unintentional.

## 2017-10-23 NOTE — Progress Notes (Signed)
Called Dr. Jodell Cipro regarding critical INR value of 8.26.  Will continue to monitor patient's levels and hemoglobin.  Christene Slates   10/23/2017  3:09 AM

## 2017-10-24 LAB — CBC
HCT: 30.7 % — ABNORMAL LOW (ref 35.0–47.0)
Hemoglobin: 10.4 g/dL — ABNORMAL LOW (ref 12.0–16.0)
MCH: 32.3 pg (ref 26.0–34.0)
MCHC: 33.9 g/dL (ref 32.0–36.0)
MCV: 95.3 fL (ref 80.0–100.0)
PLATELETS: 264 10*3/uL (ref 150–440)
RBC: 3.22 MIL/uL — ABNORMAL LOW (ref 3.80–5.20)
RDW: 15.1 % — AB (ref 11.5–14.5)
WBC: 7.8 10*3/uL (ref 3.6–11.0)

## 2017-10-24 LAB — PROTIME-INR
INR: 2.04
Prothrombin Time: 22.9 seconds — ABNORMAL HIGH (ref 11.4–15.2)

## 2017-10-24 MED ORDER — WARFARIN SODIUM 5 MG PO TABS: 5.0000 mg | ORAL_TABLET | Freq: Every day | ORAL | 0 refills | Status: DC

## 2017-10-24 MED ORDER — WARFARIN - PHARMACIST DOSING INPATIENT
Freq: Every day | Status: DC
Start: 2017-10-24 — End: 2017-10-24

## 2017-10-24 MED ORDER — KETOCONAZOLE 2 % EX CREA: 1.0000 "application " | TOPICAL_CREAM | Freq: Two times a day (BID) | CUTANEOUS | 2 refills | Status: AC

## 2017-10-24 MED ORDER — WARFARIN SODIUM 5 MG PO TABS
5.0000 mg | ORAL_TABLET | Freq: Every day | ORAL | Status: DC
  Administered 2017-10-24: 5 mg via ORAL
  Filled 2017-10-24: qty 1

## 2017-10-24 NOTE — Care Management (Addendum)
Discharge to home today per Dr. Posey Pronto. Will update Caryl Asp, [facility] representative concerning new lab. Orders. Orders faxed to [facility]. Family/friend will transport Shelbie Ammons RN MSN [place] [facility] [phone]

## 2017-10-24 NOTE — Evaluation (Signed)
Physical Therapy Evaluation Patient Details Name: [name] MRN: [id number] DOB: 1929/02/12 Today's Date: 10/24/2017   History of Present Illness  Patient is an 84 y/o female admitted with difficulty ambulating secondary to L knee pain, found to have a benign enchondroma. Her INR values had been over 8, but are now therapeutic.   Clinical Impression  Patient is seen for decreased mobility secondary to L knee pain, has degenerative arthritic changes and a benign enchondroma but no acute processes per imaging findings in this admission. She reports she has likely been under-dosing her typical pain medications, as she is able to ambulate and transfer with RW in this session without obvious antalgic pattern or loss of balance. She does fatigue relatively quickly with ambulation, but rates pain in her L knee as mild throughout ambulation. She appears to be slightly below her baseline level of mobility and was educated on proper use of RW as well as techniques to enter her household (she has three steps, will have son and grandson to help along with railing). She would likely benefit from  HHPT services to address her mobility deficits.     Follow Up Recommendations Home health PT    Equipment Recommendations  Rolling walker with 5" wheels    Recommendations for Other Services       Precautions / Restrictions Precautions Precautions: Fall Restrictions Weight Bearing Restrictions: No      Mobility  Bed Mobility Overal bed mobility: Needs Assistance Bed Mobility: Supine to Sit     Supine to sit: Min guard     General bed mobility comments: Patient is able to transfer with cga x 1, prolonged time to complete.   Transfers Overall transfer level: Needs assistance Equipment used: Rolling walker (2 wheeled) Transfers: Sit to/from Stand Sit to Stand: Min guard         General transfer comment: Patient is able to transfer with RW and cuing for appropriate technique to complete sit  to stand transfers, no loss of balance noted.   Ambulation/Gait Ambulation/Gait assistance: Min guard Ambulation Distance (Feet): 120 Feet Assistive device: Rolling walker (2 wheeled) Gait Pattern/deviations: WFL(Within Functional Limits);Trunk flexed   Gait velocity interpretation: <1.31 ft/sec, indicative of household ambulator General Gait Details: Patient ambulates with trunk flexed, RW anterior to her COM, though no antalgic pattern observed. Educated patient to push RW and keep her COM within the 4 points of the RW.   Stairs            Wheelchair Mobility    Modified Rankin (Stroke Patients Only)       Balance Overall balance assessment: Needs assistance Sitting-balance support: Bilateral upper extremity supported Sitting balance-Leahy Scale: Good     Standing balance support: Bilateral upper extremity supported Standing balance-Leahy Scale: Fair                               Pertinent Vitals/Pain Pain Assessment: Faces Pain Score: 2  Pain Location: L knee Pain Descriptors / Indicators: Aching Pain Intervention(s): Limited activity within patient's tolerance;Monitored during session    [place] expects to be discharged to:: Private residence Living Arrangements: Spouse/significant other Available Help at Discharge: Family;Available 24 hours/day Type of Home: House Home Access: Stairs to enter Entrance Stairs-Rails: Can reach both Entrance Stairs-Number of Steps: 3 Home Layout: Two level;Able to live on main level with bedroom/bathroom Home Equipment: Cane - single point Additional Comments: [facility] aide comes 3x weekly for  4 hours.     Prior Function Level of Independence: Independent with assistive device(s)         Comments: Patient has been a household ambulator with [facility], has her husband assist as needed.      Hand Dominance   Dominant Hand: Right    Extremity/Trunk Assessment   Upper Extremity Assessment Upper  Extremity Assessment: Generalized weakness    Lower Extremity Assessment Lower Extremity Assessment: Generalized weakness       Communication   Communication: No difficulties  Cognition Arousal/Alertness: Awake/alert Behavior During Therapy: WFL for tasks assessed/performed Overall Cognitive Status: Within Functional Limits for tasks assessed                                        General Comments      Exercises Other Exercises Other Exercises: Assisted patient with transfer to and from [facility] with min A without use of RW for initial transfer, cga x 1 and RW for second transfer.    Assessment/Plan    PT Assessment Patient needs continued PT services  PT Problem List Decreased strength;Decreased mobility;Cardiopulmonary status limiting activity;Decreased balance;Decreased knowledge of use of DME       PT Treatment Interventions DME instruction;Therapeutic activities;Gait training;Therapeutic exercise;Stair training;Balance training;Neuromuscular re-education    PT Goals (Current goals can be found in the Care Plan section)  Acute Rehab PT Goals Patient Stated Goal: To return home safely  PT Goal Formulation: With patient Time For Goal Achievement: 11/07/17 Potential to Achieve Goals: Good    Frequency Min 2X/week   Barriers to discharge        Co-evaluation               AM-PAC PT "6 Clicks" Daily Activity  Outcome Measure Difficulty turning over in bed (including adjusting bedclothes, sheets and blankets)?: A Little Difficulty moving from lying on back to sitting on the side of the bed? : A Little Difficulty sitting down on and standing up from a chair with arms (e.g., wheelchair, bedside commode, etc,.)?: A Little Help needed moving to and from a bed to chair (including a wheelchair)?: A Little Help needed walking in hospital room?: None Help needed climbing 3-5 steps with a railing? : A Lot 6 Click Score: 18    End of Session Equipment  Utilized During Treatment: Gait belt Activity Tolerance: Patient tolerated treatment well;Patient limited by fatigue Patient left: in chair;with call bell/phone within reach;with chair alarm set;with family/visitor present Nurse Communication: Mobility status PT Visit Diagnosis: Unsteadiness on feet (R26.81);Muscle weakness (generalized) (M62.81)    Time: 6837-2902 PT Time Calculation (min) (ACUTE ONLY): 31 min   Charges:   PT Evaluation $PT Eval Moderate Complexity: 1 Mod PT Treatments $Therapeutic Activity: 8-22 mins   PT G Codes:        Royce Macadamia PT, DPT, CSCS    10/24/2017, 2:17 PM

## 2017-10-24 NOTE — Discharge Summary (Addendum)
[facility] NAME: [name]    MR#:  [id number]  DATE OF BIRTH:  07-02-1929  DATE OF ADMISSION:  10/21/2017 ADMITTING PHYSICIAN: Henreitta Leber, MD  DATE OF DISCHARGE: 10/24/2017  PRIMARY CARE PHYSICIAN: Tama High III, MD    ADMISSION DIAGNOSIS:  Tinea corporis [B35.4] Elevated INR [R79.1] Acute pain of right knee [M25.561]  DISCHARGE DIAGNOSIS:  left knee pain-- due to chronic osteoarthritis history of recurrent DVT-- on Coumadin  SECONDARY DIAGNOSIS:   Past Medical History:  Diagnosis Date  . Arthritis   . Basal cell carcinoma 2019   LLE  . Breast cancer ([place]) 2014   right- radiation. Dr Pat Patrick  . Diabetes mellitus without complication ([place])   . Gastric ulcer   . Glaucoma   . Hiatal hernia   . Hypertension   . Osteoporosis   . Recurrent deep vein thrombosis (DVT) [facility])     HOSPITAL COURSE:   84 year old female with past medical history ofknown history ofmorbid obesity, hypertension, chronic hiatal hernia, glaucoma, history of breast cancer, diabetes, osteoporosis,chronic abdominal pain who presents to the hospital due to significant left knee pain and also abdominal pain.  1. Left knee pain-etiology unclear suspected to be secondary to underlying chronic osteoarthritis with a benign enchondroma that was noted on the CT on admission. - Patient is being admitted for pain control as she is having difficulty in voiding and bearing weight on that leg. - Spoke with case managerAbout arrangements of home health, physical therapy, visiting nurse  -patient ambulated with physical therapy to the nurses station this morning. -The new pain meds  2.Hx ofrecurrent DVT-heldCoumadin, INR was  supratherapeutic due to being on fluconazole. INR is now 2.09. Received a dose of vitamin K yesterday. Will resume Coumadin at 5 mg Q 1800 hrs. and PT/INR check to be checked by home health nurse next Tuesday and  level to be followed by Dr. Caryl Comes  3. History of glaucoma-continue latanoprost eyedrops.  4. Essential hypertension-continue lisinopril atenolol.  5. History of diabetes-continue Januvia, Actos.  6. GERD-continue Protonix.  7. Fungal rash- received  fluconazole, ketoconazole topical  8.rectal bleed- after bowel movement    Likely hemorrhoidal, but patient's INR is 7 so I would like to monitor and follow serial hemoglobin.   Overall feels better. She is thrilled to go home. Discussed with husband at bedside. Home health arrangement has been made. CONSULTS OBTAINED:    DRUG ALLERGIES:   Allergies  Allergen Reactions  . Metformin Diarrhea  . Oxycodone-Acetaminophen Nausea And Vomiting  . Oxycodone-Acetaminophen Nausea Only  . Amoxicillin Rash and Other (See Comments)    Has patient had a PCN reaction causing immediate rash, facial/tongue/throat swelling, SOB or lightheadedness with hypotension: No Has patient had a PCN reaction causing severe rash involving mucus membranes or skin necrosis: No Has patient had a PCN reaction that required hospitalization No Has patient had a PCN reaction occurring within the last 10 years: No If all of the above answers are "NO", then may proceed with Cephalosporin use.    DISCHARGE MEDICATIONS:   Allergies as of 10/24/2017      Reactions   Metformin Diarrhea   Oxycodone-acetaminophen Nausea And Vomiting   Oxycodone-acetaminophen Nausea Only   Amoxicillin Rash, Other (See Comments)   Has patient had a PCN reaction causing immediate rash, facial/tongue/throat swelling, SOB or lightheadedness with hypotension: No Has patient had a PCN reaction causing severe rash involving mucus membranes or skin  necrosis: No Has patient had a PCN reaction that required hospitalization No Has patient had a PCN reaction occurring within the last 10 years: No If all of the above answers are "NO", then may proceed with Cephalosporin use.       Medication List    STOP taking these medications   anastrozole 1 MG tablet Commonly known as:  ARIMIDEX   fluconazole 150 MG tablet Commonly known as:  DIFLUCAN   HEMORRHOIDAL MAX ST/ALOE 1-0.25-14.4-15 % Crea Generic drug:  Pramox-PE-Glycerin-Petrolatum   pioglitazone 30 MG tablet Commonly known as:  ACTOS     TAKE these medications   allopurinol 100 MG tablet Commonly known as:  ZYLOPRIM Take 100 mg by mouth daily.   ascorbic acid 1000 MG tablet Commonly known as:  VITAMIN C Take 1,000 mg by mouth daily.   aspirin EC 81 MG tablet Take 81 mg by mouth every evening.   atenolol 25 MG tablet Commonly known as:  TENORMIN Take 25 mg by mouth every evening.   CALCIUM 1200+D3 600-40-500 MG-MG-UNIT Tb24 Generic drug:  Calcium-Magnesium-Vitamin D Take 1 tablet by mouth 2 (two) times daily.   cholecalciferol 1000 units tablet Commonly known as:  VITAMIN D Take 1,000 Units by mouth daily.   docusate sodium 100 MG capsule Commonly known as:  COLACE Take 100 mg by mouth every evening.   FOLIC ACID PO Take 1 tablet by mouth every evening.   HYDROcodone-acetaminophen 5-325 MG tablet Commonly known as:  NORCO/VICODIN Take 1-2 tablets by mouth every 4 (four) hours as needed for severe pain.   ketoconazole 2 % cream Commonly known as:  NIZORAL Apply 1 application topically 2 (two) times daily.   latanoprost 0.005 % ophthalmic solution Commonly known as:  XALATAN Place 1 drop into both eyes at bedtime.   lisinopril 30 MG tablet Commonly known as:  PRINIVIL,ZESTRIL Take 30 mg by mouth 2 (two) times daily.   multivitamin with minerals Tabs tablet Take 1 tablet by mouth daily.   omeprazole 20 MG capsule Commonly known as:  PRILOSEC Take 20 mg by mouth every evening.   OSTEO BI-FLEX ADV DOUBLE ST PO Take 1 tablet by mouth 2 (two) times daily.   sitaGLIPtin 100 MG tablet Commonly known as:  JANUVIA Take 100 mg by mouth every evening.   warfarin 5 MG  tablet Commonly known as:  COUMADIN Take 1 tablet (5 mg total) by mouth daily at 6 PM. INR is high, so start after 3 days. What changed:    when to take this  additional instructions       If you experience worsening of your admission symptoms, develop shortness of breath, life threatening emergency, suicidal or homicidal thoughts you must seek medical attention immediately by calling 911 or calling your MD immediately  if symptoms less severe.  You Must read complete instructions/literature along with all the possible adverse reactions/side effects for all the Medicines you take and that have been prescribed to you. Take any new Medicines after you have completely understood and accept all the possible adverse reactions/side effects.   Please note  You were cared for by a hospitalist during your hospital stay. If you have any questions about your discharge medications or the care you received while you were in the hospital after you are discharged, you can call the unit and asked to speak with the hospitalist on call if the hospitalist that took care of you is not available. Once you are discharged, your primary care physician will  handle any further medical issues. Please note that NO REFILLS for any discharge medications will be authorized once you are discharged, as it is imperative that you return to your primary care physician (or establish a relationship with a primary care physician if you do not have one) for your aftercare needs so that they can reassess your need for medications and monitor your lab values. Today   SUBJECTIVE  feels a lot better.   VITAL SIGNS:  Blood pressure (!) 151/46, pulse 75, temperature 97.8 F (36.6 C), temperature source Oral, resp. rate (!) 22, height 5\' 4"  (1.626 m), weight 113.4 kg (250 lb), SpO2 96 %.  I/O:    Intake/Output Summary (Last 24 hours) at 10/24/2017 1250 Last data filed at 10/23/2017 2110 Gross per 24 hour  Intake 60 ml  Output -   Net 60 ml    PHYSICAL EXAMINATION:  GENERAL:  84 y.o.-year-old patient lying in the bed with no acute distress. Obese EYES: Pupils equal, round, reactive to light and accommodation. No scleral icterus. Extraocular muscles intact.  HEENT: Head atraumatic, normocephalic. Oropharynx and nasopharynx clear.  NECK:  Supple, no jugular venous distention. No thyroid enlargement, no tenderness.  LUNGS: Normal breath sounds bilaterally, no wheezing, rales,rhonchi or crepitation. No use of accessory muscles of respiration.  CARDIOVASCULAR: S1, S2 normal. No murmurs, rubs, or gallops.  ABDOMEN: Soft, non-tender, non-distended. Bowel sounds present. No organomegaly or mass.  EXTREMITIES: No pedal edema, cyanosis, or clubbing.  NEUROLOGIC: Cranial nerves II through XII are intact. Muscle strength 5/5 in all extremities. Sensation intact. Gait not checked.  PSYCHIATRIC: The patient is alert and oriented x 3.  SKIN: candida rash in the groin  DATA REVIEW:   CBC  Recent Labs  Lab 10/24/17 0556  WBC 7.8  HGB 10.4*  HCT 30.7*  PLT 264    Chemistries  Recent Labs  Lab 10/21/17 1646  NA 136  K 4.2  CL 105  CO2 23  GLUCOSE 161*  BUN 40*  CREATININE 1.28*  CALCIUM 9.2  AST 26  ALT 19  ALKPHOS 90  BILITOT 0.5    Microbiology Results   No results found for this or any previous visit (from the past 240 hour(s)).  RADIOLOGY:  Dg Abd Acute W/chest  Result Date: 10/22/2017 CLINICAL DATA:  Abdominal pain. Chronic hiatal hernia. History of breast carcinoma. EXAM: DG ABDOMEN ACUTE W/ 1V CHEST COMPARISON:  CT, 10/21/2017 FINDINGS: There is no bowel dilation to suggest obstruction. No free air. Hernia is not defined on the supine and erect AP views. There are numerous surgical vascular clips in the right central abdomen. No evidence of renal or ureteral stones. Cardiac silhouette is normal in size. There is opacity at the right lung base, likely atelectasis. There are prominent bronchovascular  markings and areas of reticular scarring. Chronic bronchitic changes seen in the central lungs. No convincing pneumonia or pulmonary edema. Skeletal structures are demineralized. Old healed fracture of the proximal left humerus. Advanced arthropathic changes of the right hip. IMPRESSION: 1. No acute findings.  No evidence of bowel obstruction or free air. 2. No convincing acute cardiopulmonary disease. Opacity at the right lung base is likely atelectasis. This may be from the prominent cardiophrenic angle fat and evident on the previous day's CT. Electronically Signed   By: Lajean Manes M.D.   On: 10/22/2017 13:04     Management plans discussed with the patient, family and they are in agreement.  CODE STATUS:     Code Status  Orders  (From admission, onward)        Start     Ordered   10/21/17 2134  Full code  Continuous     10/21/17 2134    Code Status History    Date Active Date Inactive Code Status Order ID Comments User Context   12/13/2015 1638 12/20/2015 1710 Full Code [id number]  Florene Glen, MD ED   06/04/2015 0215 06/06/2015 1632 Full Code [id number]  Marlyce Huge, MD Inpatient    Advance Directive Documentation     Most Recent Value  Type of Advance Directive  Healthcare Power of Attorney  Pre-existing out of facility DNR order (yellow form or pink MOST form)  -  "MOST" Form in Place?  -      TOTAL TIME TAKING CARE OF THIS PATIENT: *40* minutes.    Fritzi Mandes M.D on 10/24/2017 at 12:50 PM  Between 7am to 6pm - Pager - [phone] After 6pm go to [email or web address] - password [email or web address] [facility]  Office  [phone]  CC: Primary care physician; Adin Hector, MD

## 2017-10-24 NOTE — Progress Notes (Signed)
Home now w/husband. Understands d/c instr.

## 2017-11-01 ENCOUNTER — Telehealth: Payer: Self-pay

## 2017-11-01 NOTE — Telephone Encounter (Signed)
Flagged on EMMI report for having other questions/problems.  Called and spoke with patient's husband, Gwenlyn Perking, as informed patient was unavailable.  Husband mentioned they were trying to track down where Dr. Ramonita Lab from [facility] sent her pain medication prescription to.  I called and spoke with the receptionist at [facility] and was told that the script was just sent over today to [facility] on [address] in [place].   Called pharmacy to confirm that they received it.  Per pharmacist, they had received it and that someone had just stopped by and picked it up.  I called Mr. Filter back and made him aware that the medication was sent to [facility] in [place] and that someone had just picked it up.  He believes the person who must have picked it up was their son, who just arrived to their house while I was on the phone with him.  He reports they have no other questions or issues at this time other than ones Dr. Caryl Comes is already aware of for his wife.  He expressed appreciation for assistance tracking down prescription. I thanked him for his time.

## 2017-12-10 ENCOUNTER — Encounter

## 2017-12-10 ENCOUNTER — Ambulatory Visit (INDEPENDENT_AMBULATORY_CARE_PROVIDER_SITE_OTHER): Payer: Medicare Other | Admitting: Urology

## 2017-12-10 ENCOUNTER — Encounter: Payer: Self-pay | Admitting: Urology

## 2017-12-10 VITALS — BP 123/69 | HR 99

## 2017-12-10 DIAGNOSIS — R300 Dysuria: Secondary | ICD-10-CM

## 2017-12-10 NOTE — Progress Notes (Signed)
12/10/2017 3:05 PM   [name] Dec 06, 1928 [id number]  Referring provider: Adin Hector, MD [facility] [place], [place] [address]  Chief Complaint  Patient presents with  . Dysuria    HPI: [name] is an 84 year old female referred for evaluation of chronic dysuria.  She presents today with her husband who states she began to complain of burning and stinging with urination in February 2019.  She had 2+ urine cultures in February 2019 and March 2019 for beta-hemolytic strep.  She is also had pressure ulcers and topical candidiasis which may be contributing to her pelvic discomfort.  She denies gross hematuria.  There is no history of pyelonephritis or febrile UTI.  She states her husband always brings in a urine specimen because she cannot void in a physician's office and declined a catheterized specimen.  A CT of the abdomen and pelvis performed April 2019 showed no genitourinary abnormalities.  PMH: Past Medical History:  Diagnosis Date  . Arthritis   . Basal cell carcinoma 2019   LLE  . Breast cancer ([place]) 2014   right- radiation. Dr Pat Patrick  . Diabetes mellitus without complication ([place])   . Gastric ulcer   . Glaucoma   . Hiatal hernia   . Hypertension   . Osteoporosis   . Recurrent deep vein thrombosis (DVT) [facility])     Surgical History: Past Surgical History:  Procedure Laterality Date  . APPENDECTOMY    . BREAST BIOPSY Right 2014  . COLONOSCOPY  2013  . PARTIAL COLECTOMY     pre cancerous polyps/ over 10 years ago  . REPLACEMENT TOTAL KNEE    . TONSILLECTOMY    . VENTRAL HERNIA REPAIR N/A 06/04/2015   Procedure:  REPAIR of INCARCERATED VENTRAL HERNIA;  Surgeon: Marlyce Huge, MD;  Location: [facility] ORS;  Service: General;  Laterality: N/A;  . VENTRAL HERNIA REPAIR N/A 12/18/2015   Procedure: repair of recurent incarcerated ventral hernia with mesh;  Surgeon: Florene Glen, MD;  Location: [facility] ORS;  Service: General;   Laterality: N/A;    Home Medications:  Allergies as of 12/10/2017      Reactions   Metformin Diarrhea   Oxycodone-acetaminophen Nausea And Vomiting   Oxycodone-acetaminophen Nausea Only   Amoxicillin Rash, Other (See Comments)   Has patient had a PCN reaction causing immediate rash, facial/tongue/throat swelling, SOB or lightheadedness with hypotension: No Has patient had a PCN reaction causing severe rash involving mucus membranes or skin necrosis: No Has patient had a PCN reaction that required hospitalization No Has patient had a PCN reaction occurring within the last 10 years: No If all of the above answers are "NO", then may proceed with Cephalosporin use.      Medication List        Accurate as of 12/10/17  3:05 PM. Always use your most recent med list.          allopurinol 100 MG tablet Commonly known as:  ZYLOPRIM Take 100 mg by mouth daily.   ascorbic acid 1000 MG tablet Commonly known as:  VITAMIN C Take 1,000 mg by mouth daily.   aspirin EC 81 MG tablet Take 81 mg by mouth every evening.   atenolol 25 MG tablet Commonly known as:  TENORMIN Take 25 mg by mouth every evening.   CALCIUM 1200+D3 600-40-500 MG-MG-UNIT Tb24 Generic drug:  Calcium-Magnesium-Vitamin D Take 1 tablet by mouth 2 (two) times daily.   cholecalciferol 1000 units tablet Commonly known as:  VITAMIN D Take 1,000 Units by mouth daily.   docusate sodium 100 MG capsule Commonly known as:  COLACE Take 100 mg by mouth every evening.   FOLIC ACID PO Take 1 tablet by mouth every evening.   HYDROcodone-acetaminophen 5-325 MG tablet Commonly known as:  NORCO/VICODIN Take 1-2 tablets by mouth every 4 (four) hours as needed for severe pain.   hydrocortisone 2.5 % cream APP EXT AA BID PRN   ketoconazole 2 % cream Commonly known as:  NIZORAL Apply 1 application topically 2 (two) times daily.   latanoprost 0.005 % ophthalmic solution Commonly known as:  XALATAN Place 1 drop into both  eyes at bedtime.   lisinopril 30 MG tablet Commonly known as:  PRINIVIL,ZESTRIL Take 30 mg by mouth 2 (two) times daily.   multivitamin with minerals Tabs tablet Take 1 tablet by mouth daily.   omeprazole 20 MG capsule Commonly known as:  PRILOSEC Take 20 mg by mouth every evening.   OSTEO BI-FLEX ADV DOUBLE ST PO Take 1 tablet by mouth 2 (two) times daily.   OYSTER SHELL CALCIUM 500 + D 500-125 MG-UNIT Tabs Generic drug:  Calcium Carbonate-Vitamin D Take by mouth.   sitaGLIPtin 100 MG tablet Commonly known as:  JANUVIA Take 100 mg by mouth every evening.   terconazole 0.4 % vaginal cream Commonly known as:  TERAZOL 7 I 1 APL VAGINALLY Q NIGHT   warfarin 5 MG tablet Commonly known as:  COUMADIN Take 1 tablet (5 mg total) by mouth daily at 6 PM. INR is high, so start after 3 days.       Allergies:  Allergies  Allergen Reactions  . Metformin Diarrhea  . Oxycodone-Acetaminophen Nausea And Vomiting  . Oxycodone-Acetaminophen Nausea Only  . Amoxicillin Rash and Other (See Comments)    Has patient had a PCN reaction causing immediate rash, facial/tongue/throat swelling, SOB or lightheadedness with hypotension: No Has patient had a PCN reaction causing severe rash involving mucus membranes or skin necrosis: No Has patient had a PCN reaction that required hospitalization No Has patient had a PCN reaction occurring within the last 10 years: No If all of the above answers are "NO", then may proceed with Cephalosporin use.    Family History: Family History  Problem Relation Age of Onset  . Diabetes Mother   . Hypertension Mother   . Deep vein thrombosis Mother   . Heart attack Father   . Breast cancer Neg Hx     Social History:  reports that she has never smoked. She has never used smokeless tobacco. She reports that she does not drink alcohol or use drugs.  ROS: UROLOGY Frequent Urination?: No Hard to postpone urination?: No Burning/pain with urination?:  Yes Get up at night to urinate?: No Leakage of urine?: No Urine stream starts and stops?: No Trouble starting stream?: No Do you have to strain to urinate?: No Blood in urine?: No Urinary tract infection?: No Sexually transmitted disease?: No Injury to kidneys or bladder?: No Painful intercourse?: No Weak stream?: No Currently pregnant?: No Vaginal bleeding?: No Last menstrual period?: Hysterectomy  Gastrointestinal Nausea?: No Vomiting?: No Indigestion/heartburn?: No Diarrhea?: No Constipation?: No  Constitutional Fever: No Night sweats?: No Weight loss?: No Fatigue?: No  Skin Skin rash/lesions?: No Itching?: No  Eyes Blurred vision?: No Double vision?: No  Ears/Nose/Throat Sore throat?: No Sinus problems?: No  Hematologic/Lymphatic Swollen glands?: No Easy bruising?: No  Cardiovascular Leg swelling?: No Chest pain?: No  Respiratory Cough?: No Shortness of breath?:  No  Endocrine Excessive thirst?: No  Musculoskeletal Back pain?: No Joint pain?: No  Neurological Headaches?: No Dizziness?: No  Psychologic Depression?: No Anxiety?: No  Physical Exam: BP 123/69 (BP Location: Left Arm, Patient Position: Sitting, Cuff Size: Large)   Pulse 99   Constitutional:  Alert and oriented, No acute distress. HEENT: [place] AT, moist mucus membranes.  Trachea midline, no masses. Cardiovascular: No clubbing, cyanosis, or edema. Respiratory: Normal respiratory effort, no increased work of breathing. GI: Abdomen is soft, nontender, nondistended, no abdominal masses.  Abdominal wall hernia  A CT of the abdomen and pelvis performed in the ED in April 2019 showed no genitourinary abnormalities.   Assessment & Plan:   84 year old female with chronic dysuria.  Her husband will bring in a urine specimen on Monday.  I have recommended scheduling cystoscopy for further evaluation.  Pelvic exam to be performed at time of cystoscopy.   Abbie Sons,  [place] [address], [place], [place] [address] [phone]

## 2017-12-15 ENCOUNTER — Encounter: Payer: Medicare Other | Attending: Internal Medicine | Admitting: Internal Medicine

## 2017-12-15 DIAGNOSIS — B354 Tinea corporis: Secondary | ICD-10-CM | POA: Diagnosis not present

## 2017-12-15 DIAGNOSIS — E119 Type 2 diabetes mellitus without complications: Secondary | ICD-10-CM | POA: Diagnosis not present

## 2017-12-15 DIAGNOSIS — Z880 Allergy status to penicillin: Secondary | ICD-10-CM | POA: Insufficient documentation

## 2017-12-15 DIAGNOSIS — L89323 Pressure ulcer of left buttock, stage 3: Secondary | ICD-10-CM | POA: Insufficient documentation

## 2017-12-15 DIAGNOSIS — X58XXXA Exposure to other specified factors, initial encounter: Secondary | ICD-10-CM | POA: Diagnosis not present

## 2017-12-15 DIAGNOSIS — S3140XA Unspecified open wound of vagina and vulva, initial encounter: Secondary | ICD-10-CM | POA: Insufficient documentation

## 2017-12-15 DIAGNOSIS — F0390 Unspecified dementia without behavioral disturbance: Secondary | ICD-10-CM | POA: Diagnosis not present

## 2017-12-15 DIAGNOSIS — Z993 Dependence on wheelchair: Secondary | ICD-10-CM | POA: Diagnosis not present

## 2017-12-15 DIAGNOSIS — L304 Erythema intertrigo: Secondary | ICD-10-CM | POA: Insufficient documentation

## 2017-12-15 DIAGNOSIS — Z883 Allergy status to other anti-infective agents status: Secondary | ICD-10-CM | POA: Diagnosis not present

## 2017-12-15 DIAGNOSIS — I10 Essential (primary) hypertension: Secondary | ICD-10-CM | POA: Diagnosis not present

## 2017-12-15 DIAGNOSIS — L97111 Non-pressure chronic ulcer of right thigh limited to breakdown of skin: Secondary | ICD-10-CM | POA: Insufficient documentation

## 2017-12-15 DIAGNOSIS — L89322 Pressure ulcer of left buttock, stage 2: Secondary | ICD-10-CM | POA: Insufficient documentation

## 2017-12-15 DIAGNOSIS — L89312 Pressure ulcer of right buttock, stage 2: Secondary | ICD-10-CM | POA: Diagnosis present

## 2017-12-17 ENCOUNTER — Other Ambulatory Visit: Payer: Medicare Other

## 2017-12-17 DIAGNOSIS — R300 Dysuria: Secondary | ICD-10-CM

## 2017-12-17 LAB — MICROSCOPIC EXAMINATION: RBC, UA: NONE SEEN /hpf (ref 0–2)

## 2017-12-17 LAB — URINALYSIS, COMPLETE
BILIRUBIN UA: NEGATIVE
KETONES UA: NEGATIVE
LEUKOCYTES UA: NEGATIVE
Nitrite, UA: NEGATIVE
RBC UA: NEGATIVE
SPEC GRAV UA: 1.025 (ref 1.005–1.030)
Urobilinogen, Ur: 0.2 mg/dL (ref 0.2–1.0)
pH, UA: 5.5 (ref 5.0–7.5)

## 2017-12-17 NOTE — Progress Notes (Signed)
[name] ([id number]) Visit Report for 12/15/2017 Chief Complaint Document Details Patient Name: [name], [name] Date of Service: 12/15/2017 12:30 PM Medical Record Number: [id number] Patient Account Number: [id number] Date of Birth/Sex: 11-14-1928 (84 y.o. Female) Treating RN: [name] Primary Care Provider: Ramonita [name] Other Clinician: Referring Provider: Ramonita [name] Treating Provider/Extender: [name] [name]: 0 Information Obtained from: Patient Chief Complaint 12/15/17; patient is here for review of multiple pressure areas/ulcers as well as other skin issues Electronic Signature(s) Signed: 12/15/2017 6:13:14 PM By: Norma Ham MD Entered By: [name] on 12/15/2017 15:17:55 [name], [name] ([id number]) -------------------------------------------------------------------------------- HPI Details Patient Name: [name], [name] Date of Service: 12/15/2017 12:30 PM Medical Record Number: [id number] Patient Account Number: [id number] Date of Birth/Sex: [birth year] (84 y.o. Female) Treating RN: [name] Primary Care Provider: Ramonita [name] Other Clinician: Referring Provider: Ramonita [name] Treating Provider/Extender: [name] [name]: 0 History of Present Illness HPI Description: ADMISSION 12/15/17; this is an 84 year old woman who lives at home with her husband. She is referred here from her dermatologist Dr. Sarina [name] for review of pressure areas on her bilateral buttock's gluteal folds left ischial tuberosity and labia. She also has absolutely horrible tinea/intertrigo involving her lower abdomen, thighs, peri-area. This is also present in her right inframammary area. The patient is immobilized and her husband's description this is actually been getting worse. 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 She is mostly confined to wheelchair at home and  sleeps in a recliner at night and has done this for many years. Not able to get a history of urinary incontinence. Currently they are using cortisone and a and D cream to the wounds and he apparently has ketoconazole to the large areas of intertrigo. She was hospitalized in April with increased knee pain as well as scrotal irritation from tinea/[place] infection. 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 I'm not sure exactly when this was started over this is a predictable interaction which almost precludes joint use of these treatments. She has 6 pressure ulcers on the right and left gluteal folds, upper right and left buttock. Left labia. A stage III wound over the left ischial tuberosity. She also has an area on the right interior upper thigh just low the gluteal fold. And an area on the right anterior thigh. She has absolutely terrible intertrigo which could be candidal or could be tineal. She also probably has candidal dermatitis under the right greater than left breast. She is in a lot of discomfort with movement. Her husband tells me he is exhausted with the care needs of his wife currently. Electronic Signature(s) Signed: 12/15/2017 6:13:14 PM By: Norma Ham MD Entered By: [name] on 12/15/2017 15:31:02 [name] [name] ([id number]) -------------------------------------------------------------------------------- Physical Exam Details Patient Name: [name], [name] Date of Service: 12/15/2017 12:30 PM Medical Record Number: [id number] Patient Account Number: [id number] Date of Birth/Sex: August 31, 1928 (84 y.o. Female) Treating RN: [name] Primary Care Provider: Ramonita [name] Other Clinician: Referring Provider: Ramonita [name] Treating Provider/Extender: [name] [name]: 0 Constitutional Patient is hypertensive.. Pulse regular and within target range for patient.Marland Kitchen Respirations regular, non-labored and within  target range.. Temperature is normal and within the target range for the patient.Marland Kitchen appears in no distress. Eyes Conjunctivae clear. No discharge. Respiratory Respiratory effort is easy and symmetric bilaterally. Rate is normal at rest  and on room air.. Bilateral breath sounds are clear and equal in all lobes with no wheezes, rales or rhonchi.. Cardiovascular Heart rhythm and rate regular, without murmur or gallop.. Chest arch pendulous breasts with extensive probable candidal dermatitis under the right satellite lesions here. Gastrointestinal (GI) large lower abdominal hernia that reduces easily. Genitourinary (GU) bladder is not obviously distended. Integumentary (Hair, Skin) absolutely horrible intertrigo involving the lower abdomen/pannus, groin area, inner thighs anterior thighs. Psychiatric there is no doubt there is cognitive impairment. Notes wound exam; multiple areas of concern oShe has superficial wounds on the upper gluteus mirror images stage II wounds close to the midline. oOn the left ischial tuberosity a stage III wound through the muscle over the left ischial tuberosity oRight gluteal more superficial areas stage II oAreas under the right posterior thigh which are pressure areas. oShe has a ulcer on the left posterior labia and although this wouldn't be a typical area for a pressure ulcer this seems to hang down between her legs and is possibly therefore a pressure-related area in somebody who sits in the chair all night. This will need to be watched a biopsy is not out of the question Electronic Signature(s) Signed: 12/15/2017 6:13:14 PM By: Norma Ham MD Entered By: [name] on 12/15/2017 15:36:03 [name], [name] ([id number]) -------------------------------------------------------------------------------- Physician Orders Details Patient Name: [name], [name] Date of Service: 12/15/2017 12:30 PM Medical Record Number: [id number] Patient Account Number:  [id number] Date of Birth/Sex: [birth year] (84 y.o. Female) Treating RN: [name] Primary Care Provider: Ramonita [name] Other Clinician: Referring Provider: Ramonita [name] Treating Provider/Extender: Tito Dine in [name]: 0 Verbal / Phone Orders: No Diagnosis Coding Wound Cleansing o Cleanse wound with mild soap and water - All wounds Anesthetic (add to Medication List) o Topical Lidocaine 4% cream applied to wound bed prior to debridement (In Clinic Only). - all wounds as needed Skin Barriers/Peri-Wound Care o Other: - ketoconazole on abdomen Primary Wound Dressing Wound #1 Right Gluteus o Silver Alginate Wound #2 Left Gluteus o Silver Alginate Wound #3 Left Gluteal fold o Silver Alginate Wound #4 Left,Posterior Upper Leg o Silver Alginate Wound #5 Left Labia o Silver Alginate Wound #6 Right Gluteal fold o Silver Alginate Wound #7 Right Upper Leg o Silver Alginate Secondary Dressing Wound #1 Right Gluteus o Boardered Foam Dressing Wound #2 Left Gluteus o Boardered Foam Dressing Wound #3 Left Gluteal fold o Boardered Foam Dressing Wound #4 Left,Posterior Upper Leg o Boardered Foam Dressing Wound #5 Left Labia o Boardered Foam Dressing [name], [name] ([id number]) Wound #6 Right Gluteal fold o Boardered Foam Dressing Wound #7 Right Upper Leg o Boardered Foam Dressing Dressing Change Frequency o Change Dressing Monday, Wednesday, Friday Follow-up Appointments o Return Appointment in 1 week. Off-Loading Wound #1 Right Gluteus o Other: - Hospital Bed with mattress Wound #2 Left Gluteus o Other: - Hospital Bed with mattress Wound #3 Left Gluteal fold o Other: - Hospital Bed with mattress Wound #4 Left,Posterior Upper Leg o Other: - Hospital Bed with mattress Wound #5 Left Labia o Other: - Hospital Bed with mattress Wound #6 Right Gluteal fold o Other: - Hospital Bed with mattress Wound #7 Right Upper  Leg o Other: - Hospital Bed with mattress Additional Orders / Instructions o Increase protein intake. - all wounds Home Health Wound #1 Right [place] for [place] wound care and send social worker out to help family coordinate necessary care. o [place] Visits o  Home Health Nurse may visit PRN to address patientos wound care needs. o FACE TO FACE ENCOUNTER: MEDICARE and MEDICAID PATIENTS: I certify that this patient is under my care and that I had a face-to-face encounter that meets the physician face-to-face encounter requirements with this patient on this date. The encounter with the patient was in whole or in part for the following MEDICAL CONDITION: (primary reason for [place]) MEDICAL NECESSITY: I certify, that based on my findings, NURSING services are a medically necessary home health service. HOME BOUND STATUS: I certify that my clinical findings support that this patient is homebound (i.e., Due to illness or injury, pt requires aid of supportive devices such as crutches, cane, wheelchairs, walkers, the use of special transportation or the assistance of another person to leave their place of residence. There is a normal inability to leave the home and doing so requires considerable and taxing effort. Other absences are for medical reasons / religious services and are infrequent or of short duration when for other reasons). o If current dressing causes regression in wound condition, may D/C ordered dressing product/s and apply Normal Saline Moist Dressing daily until next [place] / Other MD appointment. [place] of regression in wound condition at [phone]. o Please direct any NON-WOUND related issues/requests for orders to patient's Primary Care Physician MICHEALA, MORISSETTE ([id number]) Wound #2 Left [place] for [place] wound care and send  social worker out to help family coordinate necessary care. o [place] Nurse may visit PRN to address patientos wound care needs. o FACE TO FACE ENCOUNTER: MEDICARE and MEDICAID PATIENTS: I certify that this patient is under my care and that I had a face-to-face encounter that meets the physician face-to-face encounter requirements with this patient on this date. The encounter with the patient was in whole or in part for the following MEDICAL CONDITION: (primary reason for [place]) MEDICAL NECESSITY: I certify, that based on my findings, NURSING services are a medically necessary home health service. HOME BOUND STATUS: I certify that my clinical findings support that this patient is homebound (i.e., Due to illness or injury, pt requires aid of supportive devices such as crutches, cane, wheelchairs, walkers, the use of special transportation or the assistance of another person to leave their place of residence. There is a normal inability to leave the home and doing so requires considerable and taxing effort. Other absences are for medical reasons / religious services and are infrequent or of short duration when for other reasons). o If current dressing causes regression in wound condition, may D/C ordered dressing product/s and apply Normal Saline Moist Dressing daily until next [place] / Other MD appointment. [place] of regression in wound condition at [phone]. o Please direct any NON-WOUND related issues/requests for orders to patient's Primary Care Physician Wound #3 Left Gluteal fold o [place] for [place] wound care and send social worker out to help family coordinate necessary care. o [place] Nurse may visit PRN to address patientos wound care needs. o FACE TO FACE ENCOUNTER: MEDICARE and MEDICAID PATIENTS: I certify that this  patient is under my care and that I had a face-to-face encounter that meets the physician face-to-face encounter requirements with this patient on this date. The encounter with the patient was in whole or in part for the following MEDICAL CONDITION: (primary  reason for Home Healthcare) MEDICAL NECESSITY: I certify, that based on my findings, NURSING services are a medically necessary home health service. HOME BOUND STATUS: I certify that my clinical findings support that this patient is homebound (i.e., Due to illness or injury, pt requires aid of supportive devices such as crutches, cane, wheelchairs, walkers, the use of special transportation or the assistance of another person to leave their place of residence. There is a normal inability to leave the home and doing so requires considerable and taxing effort. Other absences are for medical reasons / religious services and are infrequent or of short duration when for other reasons). o If current dressing causes regression in wound condition, may D/C ordered dressing product/s and apply Normal Saline Moist Dressing daily until next [place] / Other MD appointment. [place] of regression in wound condition at [phone]. o Please direct any NON-WOUND related issues/requests for orders to patient's Primary Care Physician Wound #4 [place] for [place] wound care and send social worker out to help family coordinate necessary care. o [place] Nurse may visit PRN to address patientos wound care needs. o FACE TO FACE ENCOUNTER: MEDICARE and MEDICAID PATIENTS: I certify that this patient is under my care and that I had a face-to-face encounter that meets the physician face-to-face encounter requirements with this patient on this date. The encounter with the patient was in whole or in part for the following  MEDICAL CONDITION: (primary reason for [place]) MEDICAL NECESSITY: I certify, that based on my findings, NURSING services are a medically necessary home health service. HOME BOUND STATUS: I certify that my clinical findings support that this patient is homebound (i.e., Due to illness or injury, pt requires aid of supportive devices such as crutches, cane, wheelchairs, walkers, the use of special transportation or the assistance of another person to leave their place of residence. There is a normal inability to leave the home and doing so requires considerable and taxing effort. Other absences are for medical reasons / religious services and are infrequent or of short duration when for other reasons). o If current dressing causes regression in wound condition, may D/C ordered dressing product/s and apply Normal Saline Moist Dressing daily until next [place] / Other MD appointment. [place] of regression in wound condition at [phone]. ALESHIA, CARTELLI ([id number]) o Please direct any NON-WOUND related issues/requests for orders to patient's Primary Care Physician Wound #5 Left [place] for [place] wound care and send social worker out to help family coordinate necessary care. o [place] Nurse may visit PRN to address patientos wound care needs. o FACE TO FACE ENCOUNTER: MEDICARE and MEDICAID PATIENTS: I certify that this patient is under my care and that I had a face-to-face encounter that meets the physician face-to-face encounter requirements with this patient on this date. The encounter with the patient was in whole or in part for the following MEDICAL CONDITION: (primary reason for [place]) MEDICAL NECESSITY: I certify, that based on my findings, NURSING services are a medically necessary home health service. HOME BOUND STATUS: I certify that my clinical findings  support that this patient is homebound (i.e., Due to illness or injury, pt requires aid of supportive devices such as crutches, cane, wheelchairs, walkers, the use of special transportation or the assistance of another person to  leave their place of residence. There is a normal inability to leave the home and doing so requires considerable and taxing effort. Other absences are for medical reasons / religious services and are infrequent or of short duration when for other reasons). o If current dressing causes regression in wound condition, may D/C ordered dressing product/s and apply Normal Saline Moist Dressing daily until next [place] / Other MD appointment. [place] of regression in wound condition at [phone]. o Please direct any NON-WOUND related issues/requests for orders to patient's Primary Care Physician Wound #6 Right Gluteal fold o [place] for [place] wound care and send social worker out to help family coordinate necessary care. o [place] Nurse may visit PRN to address patientos wound care needs. o FACE TO FACE ENCOUNTER: MEDICARE and MEDICAID PATIENTS: I certify that this patient is under my care and that I had a face-to-face encounter that meets the physician face-to-face encounter requirements with this patient on this date. The encounter with the patient was in whole or in part for the following MEDICAL CONDITION: (primary reason for [place]) MEDICAL NECESSITY: I certify, that based on my findings, NURSING services are a medically necessary home health service. HOME BOUND STATUS: I certify that my clinical findings support that this patient is homebound (i.e., Due to illness or injury, pt requires aid of supportive devices such as crutches, cane, wheelchairs, walkers, the use of special transportation or the assistance of another person to leave their place of  residence. There is a normal inability to leave the home and doing so requires considerable and taxing effort. Other absences are for medical reasons / religious services and are infrequent or of short duration when for other reasons). o If current dressing causes regression in wound condition, may D/C ordered dressing product/s and apply Normal Saline Moist Dressing daily until next [place] / Other MD appointment. [place] of regression in wound condition at [phone]. o Please direct any NON-WOUND related issues/requests for orders to patient's Primary Care Physician Wound #7 Right Upper Leg o [place] for [place] wound care and send social worker out to help family coordinate necessary care. o [place] Nurse may visit PRN to address patientos wound care needs. o FACE TO FACE ENCOUNTER: MEDICARE and MEDICAID PATIENTS: I certify that this patient is under my care and that I had a face-to-face encounter that meets the physician face-to-face encounter requirements with this patient on this date. The encounter with the patient was in whole or in part for the following MEDICAL CONDITION: (primary reason for [place]) MEDICAL NECESSITY: I certify, that based on my findings, NURSING services are a medically necessary home health service. HOME BOUND STATUS: I certify that my clinical findings support that this patient is homebound (i.e., Due to illness or injury, pt requires aid of supportive devices such as crutches, cane, wheelchairs, walkers, the use of special transportation or the assistance of another person to leave their place of residence. There is a normal inability to leave the home and doing so requires considerable and taxing effort. Other absences are for medical reasons / religious services and are infrequent or of short duration when for other reasons). [name], [name]  ([id number]) o If current dressing causes regression in wound condition, may D/C ordered dressing product/s and apply Normal Saline Moist Dressing daily until next [facility] / Other MD appointment. [place] of regression in wound condition at [phone]. o Please direct any NON-WOUND related issues/requests for orders to patient's Primary Care Physician Devices o Bed, Bariatric - Home health to order o Bed- Mattress-Overlay or Replacement - Home health to order o Wheelchair [place] [facility] to order Electronic Signature(s) Signed: 12/15/2017 6:13:14 PM By: Norma Ham MD Signed: 12/16/2017 2:15:42 PM By: Gretta Cool, BSN, RN, CWS, Kim RN, BSN Entered By: Gretta Cool, BSN, RN, CWS, Kim on 12/15/2017 17:24:13 [name], Norma [place] ([id number]) -------------------------------------------------------------------------------- Problem List Details Patient Name: [name], [name] Date of Service: 12/15/2017 12:30 PM Medical Record Number: [id number] Patient Account Number: [id number] Date of Birth/Sex: 05/04/1929 (84 y.o. 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 ([id number]) -------------------------------------------------------------------------------- Progress Note Details Patient Name: [name], [name] Date of Service: 12/15/2017 12:30 PM Medical Record Number: [id number] Patient Account Number: [id number] Date of Birth/Sex: April 27, 1929 (84 y.o. Female) Treating RN: [name] Primary Care Provider: Ramonita [name] Other Clinician: Referring Provider: Ramonita [name] Treating Provider/Extender: [name] [name]: 0 Subjective Chief Complaint Information obtained from Patient 12/15/17; patient is here for review of multiple pressure areas/ulcers as well as other skin issues History of Present Illness (HPI) ADMISSION 12/15/17; this is an 84 year old woman who lives at home with her husband. She is referred here from her dermatologist Dr. Sarina [name] for review of pressure areas on her bilateral buttock's gluteal folds left ischial tuberosity and labia. She also has absolutely horrible tinea/intertrigo involving her lower abdomen, thighs, peri-area. This is also present in her right inframammary area. The patient is immobilized and her husband's description this is actually been getting worse. 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 She is mostly confined to wheelchair at home and sleeps in a recliner at night and has done this for many years. Not able to get a history of urinary incontinence. Currently they are using cortisone and a and D cream to the wounds and he apparently has ketoconazole to the large areas of intertrigo. She was hospitalized in April with increased knee pain as well as scrotal irritation from tinea/[place] infection. This was apparently complicated by coagulopathy caused by  interaction of Coumadin with fluconazole. I'm not sure exactly when this was started over this is a predictable interaction which almost precludes joint use of these treatments. She has 6 pressure ulcers on the right and left gluteal folds, upper right and left buttock. Left labia. A stage III wound over the left ischial tuberosity. She also has an area on the right interior upper thigh just low the  gluteal fold. And an area on the right anterior thigh. She has absolutely terrible intertrigo which could be candidal or could be tineal. She also probably has candidal dermatitis under the right greater than left breast. She is in a lot of discomfort with movement. Her husband tells me he is exhausted with the care needs of his wife currently. Wound History Patient presents with 6 open wounds that have been present for approximately 3 months. Patient has been treating wounds in the following manner: AandD ointment, cortisone. Laboratory tests have not been performed in the last month. Patient reportedly has not tested positive for an antibiotic resistant organism. Patient reportedly has not tested positive for osteomyelitis. Patient reportedly has had testing performed to evaluate circulation in the legs. Patient History Information obtained from Patient. Allergies amoxicillin Family History Heart Disease - Father, Hypertension - Father,Mother, No family history of Cancer, Diabetes, Hereditary Spherocytosis, Kidney Disease, Lung Disease, Seizures, Stroke, Thyroid Problems, Tuberculosis. Social History Weins, SAPHYRA HUTT ([id number]) Never smoker, Marital Status - Married, Alcohol Use - Never, Drug Use - No History, Caffeine Use - Daily. Medical History Eyes Patient has history of Cataracts - surgery, Glaucoma Denies history of Optic Neuritis Ear/Nose/Mouth/Throat Denies history of Chronic sinus problems/congestion, Middle ear problems Hematologic/Lymphatic Denies history of Anemia, Hemophilia,  Human Immunodeficiency Virus, Lymphedema, Sickle Cell Disease Respiratory Denies history of Aspiration, Asthma, Chronic Obstructive Pulmonary Disease (COPD), Pneumothorax, Sleep Apnea, Tuberculosis Cardiovascular Patient has history of Hypertension Denies history of Angina, Arrhythmia, Congestive Heart Failure, Coronary Artery Disease, Deep Vein Thrombosis, Hypotension, Myocardial Infarction, Peripheral Arterial Disease, Peripheral Venous Disease, Phlebitis, Vasculitis Gastrointestinal Denies history of Cirrhosis , Colitis, Crohn s, Hepatitis A, Hepatitis B, Hepatitis C Endocrine Patient has history of Type II Diabetes Denies history of Type I Diabetes Genitourinary Denies history of End Stage Renal Disease Immunological Denies history of Lupus Erythematosus, Raynaud s, Scleroderma Integumentary (Skin) Denies history of History of Burn, History of pressure wounds Musculoskeletal Patient has history of Gout, Osteoarthritis Denies history of Rheumatoid Arthritis, Osteomyelitis Neurologic Patient has history of Dementia Denies history of Neuropathy, Quadriplegia, Paraplegia, Seizure Disorder Oncologic Patient has history of Received Radiation - 2014 Denies history of Received Chemotherapy Patient is treated with Oral Agents. Blood sugar is not tested. Review of Systems (ROS) Constitutional Symptoms (General Health) The patient has no complaints or symptoms. Eyes Complains or has symptoms of Glasses / Contacts. Denies complaints or symptoms of Dry Eyes, Vision Changes. Ear/Nose/Mouth/Throat The patient has no complaints or symptoms. Hematologic/Lymphatic The patient has no complaints or symptoms. Respiratory The patient has no complaints or symptoms. Cardiovascular Denies complaints or symptoms of Chest pain, LE edema. Gastrointestinal Denies complaints or symptoms of Frequent diarrhea, Nausea, Vomiting, hernia surgery 2008 removed precancerous polyps Endocrine Denies  complaints or symptoms of Hepatitis, Thyroid disease, Polydypsia (Excessive Thirst). Genitourinary Denies complaints or symptoms of Kidney failure/ Dialysis, Incontinence/dribbling, currently seeing Dr. Bernardo Heater for UTI sx but [name], [name] ([id number]) tests are neg, more tests coming up Immunological The patient has no complaints or symptoms. Integumentary (Skin) Complains or has symptoms of Wounds. Denies complaints or symptoms of Bleeding or bruising tendency, Breakdown, Swelling. Musculoskeletal Denies complaints or symptoms of Muscle Pain, Muscle Weakness. Neurologic Denies complaints or symptoms of Numbness/parasthesias, Focal/Weakness. Oncologic right breast cancer only nodes removed 2014 Objective Constitutional Patient is hypertensive.. Pulse regular and within target range for patient.Marland Kitchen Respirations regular, non-labored and within target range.. Temperature is normal and within the target range for the patient.Marland Kitchen appears in no distress. Vitals Time Taken:  12:47 PM, Height: 63 in, Source: Stated, Weight: 246 lbs, Source: Stated, BMI: 43.6, Temperature: 98.4  F, Pulse: 91 bpm, Respiratory Rate: 18 breaths/min, Blood Pressure: 173/67 mmHg. Eyes Conjunctivae clear. No discharge. Respiratory Respiratory effort is easy and symmetric bilaterally. Rate is normal at rest and on room air.. Bilateral breath sounds are clear and equal in all lobes with no wheezes, rales or rhonchi.. Cardiovascular Heart rhythm and rate regular, without murmur or gallop.. Chest arch pendulous breasts with extensive probable candidal dermatitis under the right satellite lesions here. Gastrointestinal (GI) large lower abdominal hernia that reduces easily. Genitourinary (GU) bladder is not obviously distended. Psychiatric there is no doubt there is cognitive impairment. General Notes: wound exam; multiple areas of concern She has superficial wounds on the upper gluteus mirror images stage II wounds  close to the midline. On the left ischial tuberosity a stage III wound through the muscle over the left ischial tuberosity Right gluteal more superficial areas stage II Areas under the right posterior thigh which are pressure areas. She has a ulcer on the left posterior labia and although this wouldn't be a typical area for a pressure ulcer this seems to hang down between her legs and is possibly therefore a pressure-related area in somebody who sits in the chair all night. This will Gartley, Kamora L. ([id number]) need to be watched a biopsy is not out of the question Integumentary (Hair, Skin) absolutely horrible intertrigo involving the lower abdomen/pannus, groin area, inner thighs anterior thighs. Wound #1 status is Open. Original cause of wound was Pressure Injury. The wound is located on the Right Gluteus. The wound measures 1.7cm length x 0.6cm width x 0.1cm depth; 0.801cm^2 area and 0.08cm^3 volume. There is no tunneling or undermining noted. There is a large amount of serous drainage noted. The wound margin is distinct with the outline attached to the wound base. There is large (67-100%) red granulation within the wound bed. There is a small (1-33%) amount of necrotic tissue within the wound bed including Adherent Slough. The periwound skin appearance exhibited: Ecchymosis. The periwound skin appearance did not exhibit: Erythema. Periwound temperature was noted as No Abnormality. The periwound has tenderness on palpation. Wound #2 status is Open. Original cause of wound was Pressure Injury. The wound is located on the Left Gluteus. The wound measures 0.5cm length x 0.4cm width x 0.1cm depth; 0.157cm^2 area and 0.016cm^3 volume. There is no tunneling or undermining noted. There is a large amount of serous drainage noted. The wound margin is distinct with the outline attached to the wound base. There is large (67-100%) red granulation within the wound bed. There is no necrotic tissue within the  wound bed. The periwound skin appearance exhibited: Ecchymosis. Periwound temperature was noted as No Abnormality. The periwound has tenderness on palpation. Wound #3 status is Open. Original cause of wound was Pressure Injury. The wound is located on the Left Gluteal fold. The wound measures 1.4cm length x 0.8cm width x 0.2cm depth; 0.88cm^2 area and 0.176cm^3 volume. There is no tunneling or undermining noted. There is a large amount of serous drainage noted. The wound margin is distinct with the outline attached to the wound base. There is small (1-33%) red granulation within the wound bed. There is a large (67-100%) amount of necrotic tissue within the wound bed including Adherent Slough. The periwound skin appearance exhibited: Ecchymosis. Periwound temperature was noted as No Abnormality. The periwound has tenderness on palpation. Wound #4 status is Open. Original cause of wound was Pressure  Injury. The wound is located on the Left,Posterior Upper Leg. The wound measures 0.7cm length x 0.9cm width x 0.1cm depth; 0.495cm^2 area and 0.049cm^3 volume. There is no tunneling or undermining noted. There is a large amount of serous drainage noted. The wound margin is distinct with the outline attached to the wound base. There is large (67-100%) red granulation within the wound bed. There is a small (1-33%) amount of necrotic tissue within the wound bed including Adherent Slough. The periwound skin appearance exhibited: Ecchymosis. Periwound temperature was noted as No Abnormality. The periwound has tenderness on palpation. Wound #5 status is Open. Original cause of wound was Pressure Injury. The wound is located on the Left Labia. The wound measures 1.7cm length x 0.9cm width x 0.1cm depth; 1.202cm^2 area and 0.12cm^3 volume. There is no tunneling or undermining noted. There is a large amount of serous drainage noted. The wound margin is distinct with the outline attached to the wound base. There is  large (67-100%) red granulation within the wound bed. There is a small (1-33%) amount of necrotic tissue within the wound bed including Adherent Slough. The periwound skin appearance exhibited: Ecchymosis. Periwound temperature was noted as No Abnormality. The periwound has tenderness on palpation. Wound #6 status is Open. Original cause of wound was Pressure Injury. The wound is located on the Right Gluteal fold. The wound measures 3.1cm length x 0.9cm width x 0.2cm depth; 2.191cm^2 area and 0.438cm^3 volume. There is no tunneling or undermining noted. There is a large amount of serous drainage noted. The wound margin is distinct with the outline attached to the wound base. There is medium (34-66%) red granulation within the wound bed. There is a medium (34-66%) amount of necrotic tissue within the wound bed including Adherent Slough. The periwound skin appearance exhibited: Ecchymosis. Periwound temperature was noted as No Abnormality. The periwound has tenderness on palpation. 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 Tinea corporis Unspecified open wound of unspecified external genital organs, female, subsequent encounter Plan Wound Cleansing: Wound #1 Right Gluteus: Cleanse wound with mild soap and water Wound #2 Left Gluteus: Cleanse wound with mild soap and water Wound #3 Left Gluteal fold: Cleanse wound with mild soap and water Wound #4 Left,Posterior Upper Leg: Cleanse wound with mild soap and water Wound #5 Left Labia: Cleanse wound with mild soap and water Wound #6 Right Gluteal fold: Cleanse wound with mild soap and water Anesthetic (add to Medication List): Wound #1 Right Gluteus: Topical  Lidocaine 4% cream applied to wound bed prior to debridement (In Clinic Only). Wound #2 Left Gluteus: Topical Lidocaine 4% cream applied to wound bed prior to debridement (In Clinic Only). Wound #3 Left Gluteal fold: Topical Lidocaine 4% cream applied to wound bed prior to debridement (In Clinic Only). Wound #4 Left,Posterior Upper Leg: Topical Lidocaine 4% cream applied to wound bed prior to debridement (In Clinic Only). Wound #5 Left Labia: Topical Lidocaine 4% cream applied to wound bed prior to debridement (In Clinic Only). Wound #6 Right Gluteal fold: Topical Lidocaine 4% cream applied to wound bed prior to debridement (In Clinic Only). Skin Barriers/Peri-Wound Care: Other: - ketoconazole on abdomen Primary Wound Dressing: Wound #1 Right Gluteus: Silver Alginate Wound #2 Left Gluteus: Silver Alginate Wound #3  Left Gluteal fold: Silver Alginate Wound #4 Left,Posterior Upper Leg: Silver Alginate Wound #5 Left Labia: Silver Alginate Wound #6 Right Gluteal fold: Silver Alginate Secondary Dressing: Wound #1 Right Gluteus: Boardered Foam Dressing Wound #2 Left Gluteus: Boardered Foam Dressing Wound #3 Left Gluteal fold: [name], [name] ([id number]) Boardered Foam Dressing Wound #4 Left,Posterior Upper Leg: Boardered Foam Dressing Wound #5 Left Labia: Boardered Foam Dressing Wound #6 Right Gluteal fold: Boardered Foam Dressing Dressing Change Frequency: Wound #1 Right Gluteus: Change Dressing Monday, Wednesday, Friday Wound #2 Left Gluteus: Change Dressing Monday, Wednesday, Friday Wound #3 Left Gluteal fold: Change Dressing Monday, Wednesday, Friday Wound #4 Left,Posterior Upper Leg: Change Dressing Monday, Wednesday, Friday Wound #5 Left Labia: Change Dressing Monday, Wednesday, Friday Wound #6 Right Gluteal fold: Change Dressing Monday, Wednesday, Friday Follow-up Appointments: Wound #1 Right Gluteus: Return Appointment in 1 week. Wound #2 Left Gluteus: Return  Appointment in 1 week. Wound #3 Left Gluteal fold: Return Appointment in 1 week. Wound #4 Left,Posterior Upper Leg: Return Appointment in 1 week. Wound #5 Left Labia: Return Appointment in 1 week. Wound #6 Right Gluteal fold: Return Appointment in 1 week. Off-Loading: Wound #1 Right Gluteus: Other: - Hospital Bed with mattress Wound #2 Left Gluteus: Other: - Hospital Bed with mattress Wound #3 Left Gluteal fold: Other: - Hospital Bed with mattress Wound #4 Left,Posterior Upper Leg: Other: - Hospital Bed with mattress Wound #5 Left Labia: Other: - Hospital Bed with mattress Wound #6 Right Gluteal fold: Other: - Hospital Bed with mattress Additional Orders / Instructions: Wound #1 Right Gluteus: Increase protein intake. Wound #2 Left Gluteus: Increase protein intake. Wound #3 Left Gluteal fold: Increase protein intake. Wound #4 Left,Posterior Upper Leg: Increase protein intake. Wound #5 Left Labia: Increase protein intake. Wound #6 Right Gluteal fold: Increase protein intake. Home Health: [name], VANBROCKLIN ([id number]) Wound #1 Right Gluteus: [place] for [place] Nurse may visit PRN to address patient s wound care needs. FACE TO FACE ENCOUNTER: MEDICARE and MEDICAID PATIENTS: I certify that this patient is under my care and that I had a face-to-face encounter that meets the physician face-to-face encounter requirements with this patient on this date. The encounter with the patient was in whole or in part for the following MEDICAL CONDITION: (primary reason for [place]) MEDICAL NECESSITY: I certify, that based on my findings, NURSING services are a medically necessary home health service. HOME BOUND STATUS: I certify that my clinical findings support that this patient is homebound (i.e., Due to illness or injury, pt requires aid of supportive devices such as crutches, cane, wheelchairs, walkers, the use of  special transportation or the assistance of another person to leave their place of residence. There is a normal inability to leave the home and doing so requires considerable and taxing effort. Other absences are for medical reasons / religious services and are infrequent or of short duration when for other reasons). If current dressing causes regression in wound condition, may D/C ordered dressing product/s and apply Normal Saline Moist Dressing daily until next [place] / Other MD appointment. [place] of regression in wound condition at [phone]. Please direct any NON-WOUND related issues/requests for orders to patient's Primary Care Physician Wound #2 Left Gluteus: [place] for [place] Nurse may visit PRN to address patient s wound care needs. FACE TO FACE ENCOUNTER: MEDICARE and MEDICAID PATIENTS: I certify that this patient is  under my care and that I had a face-to-face encounter that meets the physician face-to-face encounter requirements with this patient on this date. The encounter with the patient was in whole or in part for the following MEDICAL CONDITION: (primary reason for [place]) MEDICAL NECESSITY: I certify, that based on my findings, NURSING services are a medically necessary home health service. HOME BOUND STATUS: I certify that my clinical findings support that this patient is homebound (i.e., Due to illness or injury, pt requires aid of supportive devices such as crutches, cane, wheelchairs, walkers, the use of special transportation or the assistance of another person to leave their place of residence. There is a normal inability to leave the home and doing so requires considerable and taxing effort. Other absences are for medical reasons / religious services and are infrequent or of short duration when for other reasons). If current dressing causes regression in wound  condition, may D/C ordered dressing product/s and apply Normal Saline Moist Dressing daily until next [place] / Other MD appointment. [place] of regression in wound condition at [phone]. Please direct any NON-WOUND related issues/requests for orders to patient's Primary Care Physician Wound #3 Left Gluteal fold: [place] for [place] Nurse may visit PRN to address patient s wound care needs. FACE TO FACE ENCOUNTER: MEDICARE and MEDICAID PATIENTS: I certify that this patient is under my care and that I had a face-to-face encounter that meets the physician face-to-face encounter requirements with this patient on this date. The encounter with the patient was in whole or in part for the following MEDICAL CONDITION: (primary reason for [place]) MEDICAL NECESSITY: I certify, that based on my findings, NURSING services are a medically necessary home health service. HOME BOUND STATUS: I certify that my clinical findings support that this patient is homebound (i.e., Due to illness or injury, pt requires aid of supportive devices such as crutches, cane, wheelchairs, walkers, the use of special transportation or the assistance of another person to leave their place of residence. There is a normal inability to leave the home and doing so requires considerable and taxing effort. Other absences are for medical reasons / religious services and are infrequent or of short duration when for other reasons). If current dressing causes regression in wound condition, may D/C ordered dressing product/s and apply Normal Saline Moist Dressing daily until next [place] / Other MD appointment. [place] of regression in wound condition at [phone]. Please direct any NON-WOUND related issues/requests for orders to patient's Primary Care Physician Wound #4 Left,Posterior Upper  Leg: [place] for [place] Nurse may visit PRN to address patient s wound care needs. FACE TO FACE ENCOUNTER: MEDICARE and MEDICAID PATIENTS: I certify that this patient is under my care and that I had a face-to-face encounter that meets the physician face-to-face encounter requirements with this patient on this date. The encounter with the patient was in whole or in part for the following MEDICAL CONDITION: (primary reason for [place]) MEDICAL NECESSITY: I certify, that based on my findings, NURSING services are a medically necessary home health service. HOME BOUND STATUS: I certify that my clinical findings support that this patient is homebound (i.e., Due to Gettinger, BERRY GALLACHER. ([id number]) illness or injury, pt requires aid of supportive devices such as crutches, cane, wheelchairs, walkers, the use of special transportation or the assistance of another person  to leave their place of residence. There is a normal inability to leave the home and doing so requires considerable and taxing effort. Other absences are for medical reasons / religious services and are infrequent or of short duration when for other reasons). If current dressing causes regression in wound condition, may D/C ordered dressing product/s and apply Normal Saline Moist Dressing daily until next [place] / Other MD appointment. [place] of regression in wound condition at [phone]. Please direct any NON-WOUND related issues/requests for orders to patient's Primary Care Physician Wound #5 Left Labia: [place] for [place] Nurse may visit PRN to address patient s wound care needs. FACE TO FACE ENCOUNTER: MEDICARE and MEDICAID PATIENTS: I certify that this patient is under my care and that I had a face-to-face encounter that meets the physician face-to-face encounter  requirements with this patient on this date. The encounter with the patient was in whole or in part for the following MEDICAL CONDITION: (primary reason for [place]) MEDICAL NECESSITY: I certify, that based on my findings, NURSING services are a medically necessary home health service. HOME BOUND STATUS: I certify that my clinical findings support that this patient is homebound (i.e., Due to illness or injury, pt requires aid of supportive devices such as crutches, cane, wheelchairs, walkers, the use of special transportation or the assistance of another person to leave their place of residence. There is a normal inability to leave the home and doing so requires considerable and taxing effort. Other absences are for medical reasons / religious services and are infrequent or of short duration when for other reasons). If current dressing causes regression in wound condition, may D/C ordered dressing product/s and apply Normal Saline Moist Dressing daily until next [place] / Other MD appointment. [place] of regression in wound condition at [phone]. Please direct any NON-WOUND related issues/requests for orders to patient's Primary Care Physician Wound #6 Right Gluteal fold: [place] for [place] Nurse may visit PRN to address patient s wound care needs. FACE TO FACE ENCOUNTER: MEDICARE and MEDICAID PATIENTS: I certify that this patient is under my care and that I had a face-to-face encounter that meets the physician face-to-face encounter requirements with this patient on this date. The encounter with the patient was in whole or in part for the following MEDICAL CONDITION: (primary reason for [place]) MEDICAL NECESSITY: I certify, that based on my findings, NURSING services are a medically necessary home health service. HOME BOUND STATUS: I certify that my clinical findings support that  this patient is homebound (i.e., Due to illness or injury, pt requires aid of supportive devices such as crutches, cane, wheelchairs, walkers, the use of special transportation or the assistance of another person to leave their place of residence. There is a normal inability to leave the home and doing so requires considerable and taxing effort. Other absences are for medical reasons / religious services and are infrequent or of short duration when for other reasons). If current dressing causes regression in wound condition, may D/C ordered dressing product/s and apply Normal Saline Moist Dressing daily until next [place] / Other MD appointment. [place] of regression in wound condition at [phone]. Please direct any NON-WOUND related issues/requests for orders to patient's Primary Care Physician #1 we are going to use silver alginate and border foam to all the wound  areas. #2 she will require oral antifungals. I wonder whether her doctor who is Dr. Caryl Comes at the current nodal clinic would consider an alternative oral anticoagulant versus simply stopping the Coumadin. She is on Coumadin for recurrent DVTs apparently last one per her husband is 10 years ago. I think it would be reasonable to stop the Coumadin less there is more information that I am not aware of and simply monitor her. She is certainly at risk for recurrence however given her immobility status. An alternative might be considered #3 interaction with Diflucan and Coumadin is predictable and precludes their use in my opinion I do not think there is an oral alternative that would be good and affordable #4 we will try to get home care out to help with dressing supplies etc. I think she would benefit from a hospital bed with a level II or 3 surface Spano, Aislee L. ([id number]) #5 I have been firm that she cannot sit in a recliner all night. If she does not only will these not he'll but she is at risk  for further breakdown. #6 I am okay with the ketoconazole topically but I don't think that will be enough to heal the extensive areas of skin damage. #7 the area on the labia could possibly be a pressure area given the way this sits anatomically however I'm always worried about ulcers on labialis vis--vis possible cancer etc. this will need to be monitored. #8 her husband is exhausted alternatives care level may be necessary. Electronic Signature(s) Signed: 12/15/2017 6:13:14 PM By: Norma Ham MD Entered By: [name] on 12/15/2017 15:40:24 Brashier, Katreena Carlean [name] ([id number]) -------------------------------------------------------------------------------- ROS/PFSH Details Patient Name: Bellamy, Jonique L. Date of Service: 12/15/2017 12:30 PM Medical Record Number: [id number] Patient Account Number: [id number] Date of Birth/Sex: [birth year] (84 y.o. Female) Treating RN: Carolyne Fiscal, Debi Primary Care Provider: Ramonita [name] Other Clinician: Referring Provider: Ramonita [name] Treating Provider/Extender: [name] [name]: 0 Information Obtained From Patient Wound History Do you currently have one or more open woundso Yes How many open wounds do you currently haveo 6 Approximately how long have you had your woundso 3 months How have you been treating your wound(s) until nowo AandD ointment, cortisone Has your wound(s) ever healed and then re-openedo No Have you had any [name] work done in the past montho No Have you tested positive for an antibiotic resistant organism (MRSA, VRE)o No Have you tested positive for osteomyelitis (bone infection)o No Have you had any tests for circulation on your legso Yes Where was the test doneo [facility] Constitutional Symptoms (General Health) Complaints and Symptoms: No Complaints or Symptoms Complaints and Symptoms: Negative for: Fatigue; Fever; Chills; Marked Weight Change Eyes Complaints and Symptoms: Positive for: Glasses / Contacts Negative  for: Dry Eyes; Vision Changes Medical History: Positive for: Cataracts - surgery; Glaucoma Negative for: Optic Neuritis Ear/Nose/Mouth/Throat Complaints and Symptoms: No Complaints or Symptoms Complaints and Symptoms: Negative for: Difficult clearing ears; Sinusitis Medical History: Negative for: Chronic sinus problems/congestion; Middle ear problems Hematologic/Lymphatic Complaints and Symptoms: No Complaints or Symptoms Complaints and Symptoms: Stevick, Shaunessy L. ([id number]) Negative for: Bleeding / Clotting Disorders; Human Immunodeficiency Virus Medical History: Negative for: Anemia; Hemophilia; Human Immunodeficiency Virus; Lymphedema; Sickle Cell Disease Respiratory Complaints and Symptoms: No Complaints or Symptoms Complaints and Symptoms: Negative for: Chronic or frequent coughs; Shortness of Breath Medical History: Negative for: Aspiration; Asthma; Chronic Obstructive Pulmonary Disease (COPD); Pneumothorax; Sleep Apnea; Tuberculosis Cardiovascular Complaints and Symptoms: Negative for: Chest pain; LE edema Medical History:  Positive for: Hypertension Negative for: Angina; Arrhythmia; Congestive Heart Failure; Coronary Artery Disease; Deep Vein Thrombosis; Hypotension; Myocardial Infarction; Peripheral Arterial Disease; Peripheral Venous Disease; Phlebitis; Vasculitis Gastrointestinal Complaints and Symptoms: Negative for: Frequent diarrhea; Nausea; Vomiting Review of System Notes: hernia surgery 2008 removed precancerous polyps Medical History: Negative for: Cirrhosis ; Colitis; Crohnos; Hepatitis A; Hepatitis B; Hepatitis C Endocrine Complaints and Symptoms: Negative for: Hepatitis; Thyroid disease; Polydypsia (Excessive Thirst) Medical History: Positive for: Type II Diabetes Negative for: Type I Diabetes Treated with: Oral agents Blood sugar tested every day: No Genitourinary Complaints and Symptoms: Negative for: Kidney failure/ Dialysis;  Incontinence/dribbling Review of System Notes: currently seeing Dr. Bernardo Heater for UTI sx but tests are neg, more tests coming up Medical History: Negative for: End Stage Renal Disease Budnick, Juda L. ([id number]) Immunological Complaints and Symptoms: No Complaints or Symptoms Complaints and Symptoms: Negative for: Hives; Itching Medical History: Negative for: Lupus Erythematosus; Raynaudos; Scleroderma Integumentary (Skin) Complaints and Symptoms: Positive for: Wounds Negative for: Bleeding or bruising tendency; Breakdown; Swelling Medical History: Negative for: History of Burn; History of pressure wounds Musculoskeletal Complaints and Symptoms: Negative for: Muscle Pain; Muscle Weakness Medical History: Positive for: Gout; Osteoarthritis Negative for: Rheumatoid Arthritis; Osteomyelitis Neurologic Complaints and Symptoms: Negative for: Numbness/parasthesias; Focal/Weakness Medical History: Positive for: Dementia Negative for: Neuropathy; Quadriplegia; Paraplegia; Seizure Disorder Oncologic Complaints and Symptoms: Review of System Notes: right breast cancer only nodes removed 2014 Medical History: Positive for: Received Radiation - 2014 Negative for: Received Chemotherapy HBO Extended History Items Eyes: Eyes: Cataracts Glaucoma Immunizations Pneumococcal Vaccine: Received Pneumococcal Vaccination: Yes Implantable Devices Family and Social History ALAYZA, PIEPER ([id number]) Cancer: No; Diabetes: No; Heart Disease: Yes - Father; Hereditary Spherocytosis: No; Hypertension: Yes - Father,Mother; Kidney Disease: No; Lung Disease: No; Seizures: No; Stroke: No; Thyroid Problems: No; Tuberculosis: No; Never smoker; Marital Status - Married; Alcohol Use: Never; Drug Use: No History; Caffeine Use: Daily; Financial Concerns: No; Food, Clothing or Shelter Needs: No; Support System Lacking: No; Transportation Concerns: No; Advanced Directives: No; Patient does not want information  on Advanced Directives; Do not resuscitate: No; Living Will: Yes (Not Provided); Medical Power of Attorney: Yes Gwenlyn Perking (husband) (Not Provided) Electronic Signature(s) Signed: 12/15/2017 6:13:14 PM By: Norma Ham MD Signed: 12/16/2017 2:43:37 PM By: Alric Quan Entered By: Alric Quan on 12/15/2017 13:08:43 Cokley, Walida L. ([id number]) -------------------------------------------------------------------------------- SuperBill Details Patient Name: Uresti, Quinnie L. Date of Service: 12/15/2017 Medical Record Number: [id number] Patient Account Number: [id number] Date of Birth/Sex: [birth year] (84 y.o. 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 B35.4 Tinea corporis S31.502D Unspecified open wound of unspecified external genital organs, female, subsequent encounter Facility Procedures CPT4 Code: [id number] Description: [phone] - WOUND CARE VISIT-LEV 5 EST PT Modifier: Quantity: 1 Physician Procedures CPT4 Code: [id number] Description: 84166 - WC PHYS LEVEL 4 - NEW PT ICD-10 Diagnosis Description L89.312 Pressure ulcer of right buttock, stage 2 L89.322 Pressure ulcer of left buttock, stage 2 L97.111 Non-pressure chronic ulcer of right thigh limited to breakd L89.323  Pressure ulcer of left buttock, stage 3 Modifier: own of skin Quantity: 1 Electronic Signature(s)  Signed: 12/15/2017 6:13:14 PM By: Norma Ham MD Entered By: [name] on 12/15/2017 15:41:01

## 2017-12-17 NOTE — Progress Notes (Signed)
STARR, URIAS ([id number]) Visit Report for 12/15/2017 Allergy List Details Patient Name: Norma, [name] Date of Service: 12/15/2017 12:30 PM Medical Record Number: [id number] Patient Account Number: [id number] Date of Birth/Sex: 1928/08/22 (84 y.o. Female) Treating RN: Ahmed Prima Primary Care Richar Dunklee: Ramonita Lab Other Clinician: Referring Mariany Mackintosh: Ramonita Lab Treating Jaylyne Breese/Extender: Ricard Dillon Weeks in Treatment: 0 Allergies Active Allergies amoxicillin Allergy Notes Electronic Signature(s) Signed: 12/16/2017 2:43:37 PM By: Alric Quan Entered By: Alric Quan on 12/15/2017 12:55:30 Antenucci, Maeva L. ([id number]) -------------------------------------------------------------------------------- Arrival Information Details Patient Name: [name], [name] Date of Service: 12/15/2017 12:30 PM Medical Record Number: [id number] Patient Account Number: [id number] Date of Birth/Sex: 1928/10/28 (84 y.o. Female) Treating RN: Carolyne Fiscal, Debi Primary Care Dresean Beckel: Ramonita Lab Other Clinician: Referring Mashal Slavick: Ramonita Lab Treating Shaun Zuccaro/Extender: Tito Dine in Treatment: 0 Visit Information Patient Arrived: Wheel Chair Arrival Time: 12:45 Accompanied By: husband, son Transfer Assistance: EasyPivot Patient Lift Patient Identification Verified: Yes Secondary Verification Process Yes Completed: Patient Requires Transmission-Based No Precautions: Patient Has Alerts: Yes Patient Alerts: Patient on Blood Thinner DM II Warfarin Electronic Signature(s) Signed: 12/16/2017 2:43:37 PM By: Alric Quan Entered By: Alric Quan on 12/15/2017 12:55:44 England, Liliann L. ([id number]) -------------------------------------------------------------------------------- Clinic Level of Care Assessment Details Patient Name: [name], [name] Date of Service: 12/15/2017 12:30 PM Medical Record Number: [id number] Patient Account Number: [id number] Date of Birth/Sex:  01/22/1929 (84 y.o. Female) Treating RN: Cornell Barman Primary Care Harris Penton: Ramonita Lab Other Clinician: Referring Perseus Westall: Ramonita Lab Treating Isaly Fasching/Extender: Tito Dine in Treatment: 0 Clinic Level of Care Assessment Items TOOL 2 Quantity Score []  - Use when only an EandM is performed on the INITIAL visit 0 ASSESSMENTS - Nursing Assessment / Reassessment X - General Physical Exam (combine w/ comprehensive assessment (listed just below) when 1 20 performed on new pt. evals) X- 1 25 Comprehensive Assessment (HX, ROS, Risk Assessments, Wounds Hx, etc.) ASSESSMENTS - Wound and Skin Assessment / Reassessment []  - Simple Wound Assessment / Reassessment - one wound 0 X- 8 5 Complex Wound Assessment / Reassessment - multiple wounds []  - 0 Dermatologic / Skin Assessment (not related to wound area) ASSESSMENTS - Ostomy and/or Continence Assessment and Care []  - Incontinence Assessment and Management 0 []  - 0 Ostomy Care Assessment and Management (repouching, etc.) PROCESS - Coordination of Care []  - Simple Patient / Family Education for ongoing care 0 X- 1 20 Complex (extensive) Patient / Family Education for ongoing care X- 1 10 Staff obtains Programmer, systems, Records, Test Results / Process Orders []  - 0 Staff telephones HHA, Nursing Homes / Clarify orders / etc []  - 0 Routine Transfer to another Facility (non-emergent condition) []  - 0 Routine Hospital Admission (non-emergent condition) X- 1 15 New Admissions / Biomedical engineer / Ordering NPWT, Apligraf, etc. []  - 0 Emergency Hospital Admission (emergent condition) []  - 0 Simple Discharge Coordination X- 1 15 Complex (extensive) Discharge Coordination PROCESS - Special Needs []  - Pediatric / Minor Patient Management 0 []  - 0 Isolation Patient Management [name], [name] ([id number]) []  - 0 Hearing / Language / Visual special needs []  - 0 Assessment of Community assistance (transportation, D/C planning,  etc.) []  - 0 Additional assistance / Altered mentation []  - 0 Support Surface(s) Assessment (bed, cushion, seat, etc.) INTERVENTIONS - Wound Cleansing / Measurement X - Wound Imaging (photographs - any number of wounds) 1 5 []  - 0 Wound Tracing (instead of photographs) []  - 0 Simple Wound Measurement - one wound X- 8 5 Complex Wound  Measurement - multiple wounds []  - 0 Simple Wound Cleansing - one wound X- 8 5 Complex Wound Cleansing - multiple wounds INTERVENTIONS - Wound Dressings []  - Small Wound Dressing one or multiple wounds 0 []  - 0 Medium Wound Dressing one or multiple wounds X- 8 20 Large Wound Dressing one or multiple wounds []  - 0 Application of Medications - injection INTERVENTIONS - Miscellaneous []  - External ear exam 0 []  - 0 Specimen Collection (cultures, biopsies, blood, body fluids, etc.) []  - 0 Specimen(s) / Culture(s) sent or taken to Lab for analysis []  - 0 Patient Transfer (multiple staff / Civil Service fast streamer / Similar devices) []  - 0 Simple Staple / Suture removal (25 or less) []  - 0 Complex Staple / Suture removal (26 or more) []  - 0 Hypo / Hyperglycemic Management (close monitor of Blood Glucose) []  - 0 Ankle / Brachial Index (ABI) - do not check if billed separately Has the patient been seen at the hospital within the last three years: Yes Total Score: 390 Level Of Care: New/Established - Level 5 Electronic Signature(s) Signed: 12/16/2017 2:15:42 PM By: Gretta Cool, BSN, RN, CWS, Kim RN, BSN Entered By: Gretta Cool, BSN, RN, CWS, Kim on 12/15/2017 14:05:30 Arriaga, Gennie Alma ([id number]) -------------------------------------------------------------------------------- Encounter Discharge Information Details Patient Name: [name], [name] Date of Service: 12/15/2017 12:30 PM Medical Record Number: [id number] Patient Account Number: [id number] Date of Birth/Sex: 1928/09/27 (84 y.o. Female) Treating RN: Montey Hora Primary Care Conchita Truxillo: Ramonita Lab Other  Clinician: Referring Jashanti Clinkscale: Ramonita Lab Treating Dallis Czaja/Extender: Tito Dine in Treatment: 0 Encounter Discharge Information Items Discharge Condition: Stable Ambulatory Status: Wheelchair Discharge Destination: Home Transportation: Private Auto Accompanied By: spouse Schedule Follow-up Appointment: Yes Clinical Summary of Care: Electronic Signature(s) Signed: 12/15/2017 3:27:38 PM By: Montey Hora Entered By: Montey Hora on 12/15/2017 15:27:38 Salman, Cambria ([id number]) -------------------------------------------------------------------------------- Lower Extremity Assessment Details Patient Name: White, Arva L. Date of Service: 12/15/2017 12:30 PM Medical Record Number: [id number] Patient Account Number: [id number] Date of Birth/Sex: [birth year] (84 y.o. Female) Treating RN: Ahmed Prima Primary Care Angelyse Heslin: Ramonita Lab Other Clinician: Referring Shunda Rabadi: Ramonita Lab Treating Tariya Morrissette/Extender: Ricard Dillon Weeks in Treatment: 0 Electronic Signature(s) Signed: 12/16/2017 2:43:37 PM By: Alric Quan Entered By: Alric Quan on 12/15/2017 13:11:58 Brownstein, Briannia L. ([id number]) -------------------------------------------------------------------------------- Multi Wound Chart Details Patient Name: Charney, Yudit L. Date of Service: 12/15/2017 12:30 PM Medical Record Number: [id number] Patient Account Number: [id number] Date of Birth/Sex: [birth year] (84 y.o. Female) Treating RN: Cornell Barman Primary Care Danyal Whitenack: Ramonita Lab Other Clinician: Referring Leola Fiore: Ramonita Lab Treating Jaylianna Tatlock/Extender: Ricard Dillon Weeks in Treatment: 0 Vital Signs Height(in): 63 Pulse(bpm): 91 Weight(lbs): 246 Blood Pressure(mmHg): 173/67 Body Mass Index(BMI): 44 Temperature(F): 98.4 Respiratory Rate 18 (breaths/min): Photos: [1:No Photos] [2:No Photos] [3:No Photos] Wound Location: [1:Right Gluteus] [2:Left Gluteus] [3:Left Gluteal fold] Wounding  Event: [1:Pressure Injury] [2:Pressure Injury] [3:Pressure Injury] Primary Etiology: [1:Pressure Ulcer] [2:Pressure Ulcer] [3:Pressure Ulcer] Comorbid History: [1:Cataracts, Glaucoma, Hypertension, Type II Diabetes, Gout, Osteoarthritis, Dementia, Received Radiation] [2:Cataracts, Glaucoma, Hypertension, Type II Diabetes, Gout, Osteoarthritis, Dementia, Received Radiation] [3:Cataracts,  Glaucoma, Hypertension, Type II Diabetes, Gout, Osteoarthritis, Dementia, Received Radiation] Date Acquired: [1:09/14/2017] [2:09/14/2017] [3:09/14/2017] Weeks of Treatment: [1:0] [2:0] [3:0] Wound Status: [1:Open] [2:Open] [3:Open] Clustered Wound: [1:No] [2:No] [3:No] Measurements L x W x D [1:1.7x0.6x0.1] [2:0.5x0.4x0.1] [3:1.4x0.8x0.2] (cm) Area (cm) : [1:0.801] [2:0.157] [3:0.88] Volume (cm) : [1:0.08] [2:0.016] [3:0.176] % Reduction in Area: [1:0.00%] [2:N/A] [3:N/A] % Reduction in Volume: [1:0.00%] [2:N/A] [3:N/A] Classification: [1:Category/Stage II] [2:Category/Stage II] [3:Category/Stage II] Exudate Amount: [  1:Large] [2:Large] [3:Large] Exudate Type: [1:Serous] [2:Serous] [3:Serous] Exudate Color: [1:amber] [2:amber] [3:amber] Wound Margin: [1:Distinct, outline attached] [2:Distinct, outline attached] [3:Distinct, outline attached] Granulation Amount: [1:Large (67-100%)] [2:Large (67-100%)] [3:Small (1-33%)] Granulation Quality: [1:Red] [2:Red] [3:Red] Necrotic Amount: [1:Small (1-33%)] [2:None Present (0%)] [3:Large (67-100%)] Exposed Structures: [1:Fascia: No Fat Layer (Subcutaneous Tissue) Exposed: No Tendon: No Muscle: No Joint: No Bone: No] [2:Fascia: No Fat Layer (Subcutaneous Tissue) Exposed: No Tendon: No Muscle: No Joint: No Bone: No] [3:Fascia: No Fat Layer (Subcutaneous Tissue) Exposed:  No Tendon: No Muscle: No Joint: No Bone: No] Epithelialization: [1:None] [2:Large (67-100%)] [3:None] Periwound Skin Texture: [1:No Abnormalities Noted] [2:No Abnormalities Noted] [3:No Abnormalities  Noted] Periwound Skin Moisture: [1:No Abnormalities Noted] [2:No Abnormalities Noted] [3:No Abnormalities Noted] Periwound Skin Color: [2:Ecchymosis: Yes] [3:Ecchymosis: Yes] Ecchymosis: Yes Erythema: No Temperature: No Abnormality No Abnormality No Abnormality Tenderness on Palpation: Yes Yes Yes Wound Preparation: Ulcer Cleansing: Ulcer Cleansing: Ulcer Cleansing: Rinsed/Irrigated with Saline Rinsed/Irrigated with Saline Rinsed/Irrigated with Saline Topical Anesthetic Applied: Topical Anesthetic Applied: Topical Anesthetic Applied: Other: lidocaine 4% Other: lidocaine 4% Other: lidocaine 4% Wound Number: [id number]  Photos: No Photos No Photos No Photos Wound Location: Left Upper Leg - Posterior Left Labia Right Gluteal fold Wounding Event: Pressure Injury Pressure Injury Pressure Injury Primary Etiology: Pressure Ulcer Pressure Ulcer Pressure Ulcer Comorbid History: Cataracts, Glaucoma, Cataracts, Glaucoma, Cataracts, Glaucoma, Hypertension, Type II Hypertension, Type II Hypertension, Type II Diabetes, Gout, Osteoarthritis, Diabetes, Gout, Osteoarthritis, Diabetes, Gout, Osteoarthritis, Dementia, Received Radiation Dementia, Received Radiation Dementia, Received Radiation Date Acquired: 09/14/2017 09/14/2017 09/14/2017 Weeks of Treatment: 0 0 0 Wound Status: Open Open Open Clustered Wound: Yes No No Measurements L x W x D 0.7x0.9x0.1 1.7x0.9x0.1 3.1x0.9x0.2 (cm) Area (cm) : 0.495 1.202 2.191 Volume (cm) : 0.049 0.12 0.438 % Reduction in Area: N/A N/A N/A % Reduction in Volume: N/A N/A N/A Classification: Category/Stage II Category/Stage II Category/Stage II Exudate Amount: Large Large Large Exudate Type: Serous Serous Serous Exudate Color: amber amber amber Wound Margin: Distinct, outline attached Distinct, outline attached Distinct, outline attached Granulation Amount: Large (67-100%) Large (67-100%) Medium (34-66%) Granulation Quality: Red Red Red Necrotic Amount: Small  (1-33%) Small (1-33%) Medium (34-66%) Exposed Structures: Fascia: No Fascia: No Fascia: No Fat Layer (Subcutaneous Fat Layer (Subcutaneous Fat Layer (Subcutaneous Tissue) Exposed: No Tissue) Exposed: No Tissue) Exposed: No Tendon: No Tendon: No Tendon: No Muscle: No Muscle: No Muscle: No Joint: No Joint: No Joint: No Bone: No Bone: No Bone: No Epithelialization: None None None Periwound Skin Texture: No Abnormalities Noted No Abnormalities Noted No Abnormalities Noted Periwound Skin Moisture: No Abnormalities Noted No Abnormalities Noted No Abnormalities Noted Periwound Skin Color: Ecchymosis: Yes Ecchymosis: Yes Ecchymosis: Yes Temperature: No Abnormality No Abnormality No Abnormality Tenderness on Palpation: Yes Yes Yes Wound Preparation: Ulcer Cleansing: Ulcer Cleansing: Ulcer Cleansing: Rinsed/Irrigated with Saline Rinsed/Irrigated with Saline Rinsed/Irrigated with Saline Topical Anesthetic Applied: Topical Anesthetic Applied: Topical Anesthetic Applied: Other: lidocaine 4% Other: lidocaine 4% Other: lidocaine 4% Treatment Notes DORSEY, CHARETTE ([id number]) Electronic Signature(s) Signed: 12/16/2017 2:15:42 PM By: Gretta Cool, BSN, RN, CWS, Kim RN, BSN Entered By: Gretta Cool, BSN, RN, CWS, Kim on 12/15/2017 13:48:17 Lanum, Gennie Alma ([id number]) -------------------------------------------------------------------------------- Multi-Disciplinary Care Plan Details Patient Name: Cato, [place] Date of Service: 12/15/2017 12:30 PM Medical Record Number: [id number] Patient Account Number: [id number] Date of Birth/Sex: [birth year] (84 y.o. Female) Treating RN: Cornell Barman Primary Care Zyra Parrillo: Ramonita Lab Other Clinician: Referring Yaritzel Stange: Ramonita Lab Treating Zasha Belleau/Extender: Ricard Dillon Weeks in Treatment: 0 Active Inactive `  Orientation to the Wound Care Program Nursing Diagnoses: Knowledge deficit related to the wound healing center program Goals: Patient/caregiver  will verbalize understanding of the [facility] Date Initiated: 12/15/2017 Target Resolution Date: 12/15/2017 Goal Status: Active Interventions: Provide education on orientation to the wound center Notes: ` Pressure Nursing Diagnoses: Knowledge deficit related to management of pressures ulcers Goals: Patient/caregiver will verbalize understanding of pressure ulcer management Date Initiated: 12/15/2017 Target Resolution Date: 12/16/2017 Goal Status: Active Interventions: Assess: immobility, friction, shearing, incontinence upon admission and as needed Notes: ` Wound/Skin Impairment Nursing Diagnoses: Knowledge deficit related to ulceration/compromised skin integrity Goals: Ulcer/skin breakdown will have a volume reduction of 80% by week 12 Date Initiated: 12/15/2017 Target Resolution Date: 06/16/2018 Goal Status: Active Interventions: Mckelvie, Chari L. ([id number]) Assess patient/caregiver ability to obtain necessary supplies Provide education on ulcer and skin care Treatment Activities: Topical wound management initiated : 12/15/2017 Notes: Electronic Signature(s) Signed: 12/16/2017 2:15:42 PM By: Gretta Cool, BSN, RN, CWS, Kim RN, BSN Entered By: Gretta Cool, BSN, RN, CWS, Kim on 12/15/2017 13:47:33 Deer, Kalisi L. ([id number]) -------------------------------------------------------------------------------- Non-Wound Condition Assessment Details Patient Name: Ellwanger, Zipporah L. Date of Service: 12/15/2017 12:30 PM Medical Record Number: [id number] Patient Account Number: [id number] Date of Birth/Sex: April 11, 1929 (84 y.o. Female) Treating RN: Cornell Barman Primary Care Elaura Calix: Ramonita Lab Other Clinician: Referring Shalanda Brogden: Ramonita Lab Treating Franklyn Cafaro/Extender: Ricard Dillon Weeks in Treatment: 0 Non-Wound Condition: Condition: Rash / Dermatitis Location: Abdomen Side: Bilateral Photos Periwound Skin Texture Texture Color No Abnormalities Noted: No No Abnormalities Noted:  No Rash: Yes Temperature / Pain Moisture Temperature: Hot No Abnormalities Noted: No Electronic Signature(s) Signed: 12/15/2017 5:11:38 PM By: Montey Hora Signed: 12/16/2017 2:15:42 PM By: Gretta Cool, BSN, RN, CWS, Kim RN, BSN Entered By: Montey Hora on 12/15/2017 16:54:17 Robison, Ilayda L. ([id number]) -------------------------------------------------------------------------------- Non-Wound Condition Assessment Details Patient Name: Hockey, Mahrosh L. Date of Service: 12/15/2017 12:30 PM Medical Record Number: [id number] Patient Account Number: [id number] Date of Birth/Sex: 01/21/1929 (84 y.o. Female) Treating RN: Cornell Barman Primary Care Shaquita Fort: Ramonita Lab Other Clinician: Referring Naureen Benton: Ramonita Lab Treating Burak Zerbe/Extender: Ricard Dillon Weeks in Treatment: 0 Non-Wound Condition: Condition: Rash / Dermatitis Location: Other: under breast Side: Bilateral Photos Periwound Skin Texture Texture Color No Abnormalities Noted: No No Abnormalities Noted: No Rash: Yes Moisture No Abnormalities Noted: No Electronic Signature(s) Signed: 12/15/2017 5:11:38 PM By: Montey Hora Signed: 12/16/2017 2:15:42 PM By: Gretta Cool, BSN, RN, CWS, Kim RN, BSN Entered By: Montey Hora on 12/15/2017 16:54:39 Eliasen, Delorse L. ([id number]) -------------------------------------------------------------------------------- Pain Assessment Details Patient Name: Swayne, Chandelle L. Date of Service: 12/15/2017 12:30 PM Medical Record Number: [id number] Patient Account Number: [id number] Date of Birth/Sex: 03-06-1929 (84 y.o. Female) Treating RN: Carolyne Fiscal, Debi Primary Care Oris Calmes: Ramonita Lab Other Clinician: Referring Vinnie Bobst: Ramonita Lab Treating Kymberlyn Eckford/Extender: Ricard Dillon Weeks in Treatment: 0 Active Problems Location of Pain Severity and Description of Pain Patient Has Paino Yes Site Locations Pain Location: Pain in Ulcers Rate the pain. Current Pain Level: 8 Character of Pain Describe the  Pain: Aching, Burning Pain Management and Medication Current Pain Management: Electronic Signature(s) Signed: 12/16/2017 2:43:37 PM By: Alric Quan Entered By: Alric Quan on 12/15/2017 12:47:49 Mcneeley, Presli Carlean Jews ([id number]) -------------------------------------------------------------------------------- Patient/Caregiver Education Details Patient Name: Monteverde, Maymuna L. Date of Service: 12/15/2017 12:30 PM Medical Record Number: [id number] Patient Account Number: [id number] Date of Birth/Gender: March 17, 1929 (84 y.o. Female) Treating RN: Montey Hora Primary Care Physician: Ramonita Lab Other Clinician: Referring Physician: Ramonita Lab Treating Physician/Extender: Tito Dine in Treatment: 0 Education  Assessment Education Provided To: Patient and Caregiver Education Topics Provided Wound/Skin Impairment: Handouts: Other: wound care as ordered Methods: Demonstration, Explain/Verbal Responses: State content correctly Electronic Signature(s) Signed: 12/15/2017 5:11:38 PM By: Montey Hora Entered By: Montey Hora on 12/15/2017 15:28:14 Gannett, Evelynn L. ([id number]) -------------------------------------------------------------------------------- Wound Assessment Details Patient Name: Fallin, Anari L. Date of Service: 12/15/2017 12:30 PM Medical Record Number: [id number] Patient Account Number: [id number] Date of Birth/Sex: Feb 07, 1929 (84 y.o. Female) Treating RN: Carolyne Fiscal, Debi Primary Care Maryjane Benedict: Ramonita Lab Other Clinician: Referring Nakhia Levitan: Ramonita Lab Treating Lydon Vansickle/Extender: Ricard Dillon Weeks in Treatment: 0 Wound Status Wound Number: 1 Primary Pressure Ulcer Etiology: Wound Location: Right Gluteus Wound Open Wounding Event: Pressure Injury Status: Date Acquired: 09/14/2017 Comorbid Cataracts, Glaucoma, Hypertension, Type II Weeks Of Treatment: 0 History: Diabetes, Gout, Osteoarthritis, Dementia, Clustered Wound: No Received  Radiation Photos Photo Uploaded By: Alric Quan on 12/16/2017 07:51:52 Wound Measurements Length: (cm) 1.7 Width: (cm) 0.6 Depth: (cm) 0.1 Area: (cm) 0.801 Volume: (cm) 0.08 % Reduction in Area: 0% % Reduction in Volume: 0% Epithelialization: None Tunneling: No Undermining: No Wound Description Classification: Category/Stage II Wound Margin: Distinct, outline attached Exudate Amount: Large Exudate Type: Serous Exudate Color: amber Foul Odor After Cleansing: No Slough/Fibrino Yes Wound Bed Granulation Amount: Large (67-100%) Exposed Structure Granulation Quality: Red Fascia Exposed: No Necrotic Amount: Small (1-33%) Fat Layer (Subcutaneous Tissue) Exposed: No Necrotic Quality: Adherent Slough Tendon Exposed: No Muscle Exposed: No Joint Exposed: No Bone Exposed: No Periwound Skin Texture Texture Color No Abnormalities Noted: No No Abnormalities Noted: No Till, Tonea L. ([id number]) Moisture Ecchymosis: Yes No Abnormalities Noted: No Erythema: No Temperature / Pain Temperature: No Abnormality Tenderness on Palpation: Yes Wound Preparation Ulcer Cleansing: Rinsed/Irrigated with Saline Topical Anesthetic Applied: Other: lidocaine 4%, Treatment Notes Wound #1 (Right Gluteus) 1. Cleansed with: Clean wound with Normal Saline 3. Peri-wound Care: Antifungal cream 4. Dressing Applied: Other dressing (specify in notes) 5. Secondary Dressing Applied ABD Pad Notes silvercel, secured with undergarment Electronic Signature(s) Signed: 12/16/2017 2:43:37 PM By: Alric Quan Entered By: Alric Quan on 12/15/2017 13:25:10 Chappuis, Mazie L. ([id number]) -------------------------------------------------------------------------------- Wound Assessment Details Patient Name: Nakagawa, Keyona L. Date of Service: 12/15/2017 12:30 PM Medical Record Number: [id number] Patient Account Number: [id number] Date of Birth/Sex: 28-Feb-1929 (84 y.o. Female) Treating RN: Carolyne Fiscal,  Debi Primary Care Simya Tercero: Ramonita Lab Other Clinician: Referring Eulanda Dorion: Ramonita Lab Treating Annjanette Wertenberger/Extender: Ricard Dillon Weeks in Treatment: 0 Wound Status Wound Number: 2 Primary Pressure Ulcer Etiology: Wound Location: Left Gluteus Wound Open Wounding Event: Pressure Injury Status: Date Acquired: 09/14/2017 Comorbid Cataracts, Glaucoma, Hypertension, Type II Weeks Of Treatment: 0 History: Diabetes, Gout, Osteoarthritis, Dementia, Clustered Wound: No Received Radiation Photos Photo Uploaded By: Alric Quan on 12/16/2017 07:51:52 Wound Measurements Length: (cm) 0.5 Width: (cm) 0.4 Depth: (cm) 0.1 Area: (cm) 0.157 Volume: (cm) 0.016 % Reduction in Area: % Reduction in Volume: Epithelialization: Large (67-100%) Tunneling: No Undermining: No Wound Description Classification: Category/Stage II Wound Margin: Distinct, outline attached Exudate Amount: Large Exudate Type: Serous Exudate Color: amber Foul Odor After Cleansing: No Slough/Fibrino No Wound Bed Granulation Amount: Large (67-100%) Exposed Structure Granulation Quality: Red Fascia Exposed: No Necrotic Amount: None Present (0%) Fat Layer (Subcutaneous Tissue) Exposed: No Tendon Exposed: No Muscle Exposed: No Joint Exposed: No Bone Exposed: No Periwound Skin Texture Texture Color No Abnormalities Noted: No No Abnormalities Noted: No Crilly, Hulda L. ([id number]) Moisture Ecchymosis: Yes No Abnormalities Noted: No Temperature / Pain Temperature: No Abnormality Tenderness on Palpation: Yes Wound Preparation Ulcer Cleansing: Rinsed/Irrigated with  Saline Topical Anesthetic Applied: Other: lidocaine 4%, Treatment Notes Wound #2 (Left Gluteus) 1. Cleansed with: Clean wound with Normal Saline 3. Peri-wound Care: Antifungal cream 4. Dressing Applied: Other dressing (specify in notes) 5. Secondary Dressing Applied ABD Pad Notes silvercel, secured with undergarment Electronic  Signature(s) Signed: 12/16/2017 2:43:37 PM By: Alric Quan Entered By: Alric Quan on 12/15/2017 13:22:19 Watts, Brihana L. ([id number]) -------------------------------------------------------------------------------- Wound Assessment Details Patient Name: Settlemire, Kaytlynn L. Date of Service: 12/15/2017 12:30 PM Medical Record Number: [id number] Patient Account Number: [id number] Date of Birth/Sex: 1928-09-04 (84 y.o. Female) Treating RN: Carolyne Fiscal, Debi Primary Care Tabor Denham: Ramonita Lab Other Clinician: Referring Coren Sagan: Ramonita Lab Treating Roshaunda Starkey/Extender: Ricard Dillon Weeks in Treatment: 0 Wound Status Wound Number: 3 Primary Pressure Ulcer Etiology: Wound Location: Left Gluteal fold Wound Open Wounding Event: Pressure Injury Status: Date Acquired: 09/14/2017 Comorbid Cataracts, Glaucoma, Hypertension, Type II Weeks Of Treatment: 0 History: Diabetes, Gout, Osteoarthritis, Dementia, Clustered Wound: No Received Radiation Photos Photo Uploaded By: Alric Quan on 12/16/2017 07:52:43 Wound Measurements Length: (cm) 1.4 Width: (cm) 0.8 Depth: (cm) 0.2 Area: (cm) 0.88 Volume: (cm) 0.176 % Reduction in Area: 0% % Reduction in Volume: 0% Epithelialization: None Tunneling: No Undermining: No Wound Description Classification: Category/Stage III Wound Margin: Distinct, outline attached Exudate Amount: Large Exudate Type: Serous Exudate Color: amber Wound Bed Granulation Amount: Small (1-33%) Exposed Structure Granulation Quality: Red Fascia Exposed: No Necrotic Amount: Large (67-100%) Fat Layer (Subcutaneous Tissue) Exposed: Yes Necrotic Quality: Adherent Slough Tendon Exposed: No Muscle Exposed: No Joint Exposed: No Bone Exposed: No Periwound Skin Texture Texture Color No Abnormalities Noted: No No Abnormalities Noted: No Raska, Goldy L. ([id number]) Moisture Ecchymosis: Yes No Abnormalities Noted: No Temperature / Pain Temperature: No  Abnormality Tenderness on Palpation: Yes Wound Preparation Ulcer Cleansing: Rinsed/Irrigated with Saline Topical Anesthetic Applied: Other: lidocaine 4%, Treatment Notes Wound #3 (Left Gluteal fold) 1. Cleansed with: Clean wound with Normal Saline 3. Peri-wound Care: Antifungal cream 4. Dressing Applied: Other dressing (specify in notes) 5. Secondary Dressing Applied ABD Pad Notes silvercel, secured with undergarment Electronic Signature(s) Signed: 12/15/2017 5:13:12 PM By: Montey Hora Signed: 12/16/2017 2:43:37 PM By: Alric Quan Entered By: Montey Hora on 12/15/2017 17:13:12 Mustin, Athenia L. ([id number]) -------------------------------------------------------------------------------- Wound Assessment Details Patient Name: Flicker, Dabney L. Date of Service: 12/15/2017 12:30 PM Medical Record Number: [id number] Patient Account Number: [id number] Date of Birth/Sex: 1929/03/11 (84 y.o. Female) Treating RN: Carolyne Fiscal, Debi Primary Care Birgit Nowling: Ramonita Lab Other Clinician: Referring Rhyanna Sorce: Ramonita Lab Treating Stevenson Windmiller/Extender: Ricard Dillon Weeks in Treatment: 0 Wound Status Wound Number: 4 Primary Pressure Ulcer Etiology: Wound Location: Left Upper Leg - Posterior Wound Open Wounding Event: Pressure Injury Status: Date Acquired: 09/14/2017 Comorbid Cataracts, Glaucoma, Hypertension, Type II Weeks Of Treatment: 0 History: Diabetes, Gout, Osteoarthritis, Dementia, Clustered Wound: Yes Received Radiation Photos Photo Uploaded By: Alric Quan on 12/16/2017 07:52:43 Wound Measurements Length: (cm) 0.7 Width: (cm) 0.9 Depth: (cm) 0.1 Area: (cm) 0.495 Volume: (cm) 0.049 % Reduction in Area: % Reduction in Volume: Epithelialization: None Tunneling: No Undermining: No Wound Description Classification: Category/Stage II Wound Margin: Distinct, outline attached Exudate Amount: Large Exudate Type: Serous Exudate Color: amber Foul Odor After  Cleansing: No Slough/Fibrino Yes Wound Bed Granulation Amount: Large (67-100%) Exposed Structure Granulation Quality: Red Fascia Exposed: No Necrotic Amount: Small (1-33%) Fat Layer (Subcutaneous Tissue) Exposed: No Necrotic Quality: Adherent Slough Tendon Exposed: No Muscle Exposed: No Joint Exposed: No Bone Exposed: No Periwound Skin Texture Texture Color No Abnormalities Noted: No No Abnormalities Noted: No Mcauley,  Aitana L. ([id number]) Moisture Ecchymosis: Yes No Abnormalities Noted: No Temperature / Pain Temperature: No Abnormality Tenderness on Palpation: Yes Wound Preparation Ulcer Cleansing: Rinsed/Irrigated with Saline Topical Anesthetic Applied: Other: lidocaine 4%, Treatment Notes Wound #4 (Left, Posterior Upper Leg) 1. Cleansed with: Clean wound with Normal Saline 3. Peri-wound Care: Antifungal cream 4. Dressing Applied: Other dressing (specify in notes) 5. Secondary Dressing Applied ABD Pad Notes silvercel, secured with undergarment Electronic Signature(s) Signed: 12/16/2017 2:43:37 PM By: Alric Quan Entered By: Alric Quan on 12/15/2017 13:24:55 Weppler, Blakelee L. ([id number]) -------------------------------------------------------------------------------- Wound Assessment Details Patient Name: Italiano, Macaria L. Date of Service: 12/15/2017 12:30 PM Medical Record Number: [id number] Patient Account Number: [id number] Date of Birth/Sex: [birth year] (84 y.o. Female) Treating RN: Carolyne Fiscal, Debi Primary Care Oscar Hank: Ramonita Lab Other Clinician: Referring Aeric Burnham: Ramonita Lab Treating Draiden Mirsky/Extender: Ricard Dillon Weeks in Treatment: 0 Wound Status Wound Number: 5 Primary Pressure Ulcer Etiology: Wound Location: Left Labia Wound Open Wounding Event: Pressure Injury Status: Date Acquired: 09/14/2017 Comorbid Cataracts, Glaucoma, Hypertension, Type II Weeks Of Treatment: 0 History: Diabetes, Gout, Osteoarthritis, Dementia, Clustered Wound:  No Received Radiation Photos Photo Uploaded By: Alric Quan on 12/16/2017 07:55:01 Wound Measurements Length: (cm) 1.7 Width: (cm) 0.9 Depth: (cm) 0.1 Area: (cm) 1.202 Volume: (cm) 0.12 % Reduction in Area: % Reduction in Volume: Epithelialization: None Tunneling: No Undermining: No Wound Description Classification: Category/Stage II Wound Margin: Distinct, outline attached Exudate Amount: Large Exudate Type: Serous Exudate Color: amber Foul Odor After Cleansing: No Slough/Fibrino Yes Wound Bed Granulation Amount: Large (67-100%) Exposed Structure Granulation Quality: Red Fascia Exposed: No Necrotic Amount: Small (1-33%) Fat Layer (Subcutaneous Tissue) Exposed: No Necrotic Quality: Adherent Slough Tendon Exposed: No Muscle Exposed: No Joint Exposed: No Bone Exposed: No Periwound Skin Texture Texture Color No Abnormalities Noted: No No Abnormalities Noted: No Pearse, Tenae L. ([id number]) Moisture Ecchymosis: Yes No Abnormalities Noted: No Temperature / Pain Temperature: No Abnormality Tenderness on Palpation: Yes Wound Preparation Ulcer Cleansing: Rinsed/Irrigated with Saline Topical Anesthetic Applied: Other: lidocaine 4%, Treatment Notes Wound #5 (Left Labia) 1. Cleansed with: Clean wound with Normal Saline 3. Peri-wound Care: Antifungal cream 4. Dressing Applied: Other dressing (specify in notes) 5. Secondary Dressing Applied ABD Pad Notes silvercel, secured with undergarment Electronic Signature(s) Signed: 12/16/2017 2:43:37 PM By: Alric Quan Entered By: Alric Quan on 12/15/2017 13:26:14 Salberg, Shany L. ([id number]) -------------------------------------------------------------------------------- Wound Assessment Details Patient Name: Sween, Marquetta L. Date of Service: 12/15/2017 12:30 PM Medical Record Number: [id number] Patient Account Number: [id number] Date of Birth/Sex: 1928/10/24 (84 y.o. Female) Treating RN: Carolyne Fiscal,  Debi Primary Care Lin Glazier: Ramonita Lab Other Clinician: Referring Kazoua Gossen: Ramonita Lab Treating Malaky Tetrault/Extender: Ricard Dillon Weeks in Treatment: 0 Wound Status Wound Number: 6 Primary Pressure Ulcer Etiology: Wound Location: Right Gluteal fold Wound Open Wounding Event: Pressure Injury Status: Date Acquired: 09/14/2017 Comorbid Cataracts, Glaucoma, Hypertension, Type II Weeks Of Treatment: 0 History: Diabetes, Gout, Osteoarthritis, Dementia, Clustered Wound: No Received Radiation Wound Measurements Length: (cm) 3.1 Width: (cm) 0.9 Depth: (cm) 0.2 Area: (cm) 2.191 Volume: (cm) 0.438 % Reduction in Area: % Reduction in Volume: Epithelialization: None Tunneling: No Undermining: No Wound Description Classification: Category/Stage II Wound Margin: Distinct, outline attached Exudate Amount: Large Exudate Type: Serous Exudate Color: amber Foul Odor After Cleansing: No Slough/Fibrino Yes Wound Bed Granulation Amount: Medium (34-66%) Exposed Structure Granulation Quality: Red Fascia Exposed: No Necrotic Amount: Medium (34-66%) Fat Layer (Subcutaneous Tissue) Exposed: No Necrotic Quality: Adherent Slough Tendon Exposed: No Muscle Exposed: No Joint Exposed: No Bone Exposed: No Periwound Skin  Texture Texture Color No Abnormalities Noted: No No Abnormalities Noted: No Ecchymosis: Yes Moisture No Abnormalities Noted: No Temperature / Pain Temperature: No Abnormality Tenderness on Palpation: Yes Wound Preparation Ulcer Cleansing: Rinsed/Irrigated with Saline Topical Anesthetic Applied: Other: lidocaine 4%, Treatment Notes Kilty, Denesha L. ([id number]) Wound #6 (Right Gluteal fold) 1. Cleansed with: Clean wound with Normal Saline 3. Peri-wound Care: Antifungal cream 4. Dressing Applied: Other dressing (specify in notes) 5. Secondary Dressing Applied ABD Pad Notes silvercel, secured with undergarment Electronic Signature(s) Signed: 12/16/2017 2:43:37  PM By: Alric Quan Entered By: Alric Quan on 12/15/2017 13:27:27 Castell, Klawock ([id number]) -------------------------------------------------------------------------------- Wound Assessment Details Patient Name: Blowe, Ivyonna L. Date of Service: 12/15/2017 12:30 PM Medical Record Number: [id number] Patient Account Number: [id number] Date of Birth/Sex: 1928/10/23 (84 y.o. Female) Treating RN: Montey Hora Primary Care Byan Poplaski: Ramonita Lab Other Clinician: Referring Dale Ribeiro: Ramonita Lab Treating Miguel Christiana/Extender: Ricard Dillon Weeks in Treatment: 0 Wound Status Wound Number: 7 Primary Fungal Etiology: Wound Location: Right Upper Leg Wound Open Wounding Event: Gradually Appeared Status: Date Acquired: 07/13/2017 Comorbid Cataracts, Glaucoma, Hypertension, Type II Weeks Of Treatment: 0 History: Diabetes, Gout, Osteoarthritis, Dementia, Clustered Wound: No Received Radiation Wound Measurements Length: (cm) 1.8 Width: (cm) 1.5 Depth: (cm) 0.1 Area: (cm) 2.121 Volume: (cm) 0.212 % Reduction in Area: 0% % Reduction in Volume: 0% Epithelialization: None Tunneling: No Undermining: No Wound Description Full Thickness Without Exposed Support Classification: Structures Wound Margin: Flat and Intact Exudate Large Amount: Exudate Type: Serous Exudate Color: amber Foul Odor After Cleansing: No Slough/Fibrino No Wound Bed Granulation Amount: Large (67-100%) Exposed Structure Granulation Quality: Hyper-granulation Fascia Exposed: No Necrotic Amount: None Present (0%) Fat Layer (Subcutaneous Tissue) Exposed: Yes Tendon Exposed: No Muscle Exposed: No Joint Exposed: No Bone Exposed: No Periwound Skin Texture Texture Color No Abnormalities Noted: No No Abnormalities Noted: No Callus: No Atrophie Blanche: No Crepitus: No Cyanosis: No Excoriation: Yes Ecchymosis: No Induration: No Erythema: No Rash: Yes Hemosiderin Staining: No Scarring: No Mottled:  No Pallor: No Moisture Rubor: No No Abnormalities Noted: No Violet, Denym L. ([id number]) Dry / Scaly: No Temperature / Pain Maceration: No Temperature: No Abnormality Tenderness on Palpation: Yes Wound Preparation Ulcer Cleansing: Rinsed/Irrigated with Saline Topical Anesthetic Applied: None Treatment Notes Wound #7 (Right Upper Leg) 1. Cleansed with: Clean wound with Normal Saline 3. Peri-wound Care: Antifungal cream 4. Dressing Applied: Other dressing (specify in notes) 5. Secondary Dressing Applied ABD Pad Notes silvercel, secured with undergarment Electronic Signature(s) Signed: 12/15/2017 5:11:38 PM By: Montey Hora Entered By: Montey Hora on 12/15/2017 16:52:14 Judice, Nakisha L. ([id number]) -------------------------------------------------------------------------------- Vitals Details Patient Name: Monteith, Sanna L. Date of Service: 12/15/2017 12:30 PM Medical Record Number: [id number] Patient Account Number: [id number] Date of Birth/Sex: Sep 09, 1928 (84 y.o. Female) Treating RN: Carolyne Fiscal, Debi Primary Care Melayah Skorupski: Ramonita Lab Other Clinician: Referring Kaytlin Burklow: Ramonita Lab Treating Anabel Lykins/Extender: Ricard Dillon Weeks in Treatment: 0 Vital Signs Time Taken: 12:47 Temperature (F): 98.4 Height (in): 63 Pulse (bpm): 91 Source: Stated Respiratory Rate (breaths/min): 18 Weight (lbs): 246 Blood Pressure (mmHg): 173/67 Source: Stated Reference Range: 80 - 120 mg / dl Body Mass Index (BMI): 43.6 Electronic Signature(s) Signed: 12/16/2017 2:43:37 PM By: Alric Quan Entered By: Alric Quan on 12/15/2017 12:54:39

## 2017-12-17 NOTE — Progress Notes (Signed)
JANIYAH, BEERY ([id number]) Visit Report for 12/15/2017 Abuse/Suicide Risk Screen Details Patient Name: [name], [name] Date of Service: 12/15/2017 12:30 PM Medical Record Number: [id number] Patient Account Number: [id number] Date of Birth/Sex: 08-20-1928 (84 y.o. Female) Treating RN: Carolyne Fiscal, Debi Primary Care Runa Whittingham: Ramonita Lab Other Clinician: Referring Sumire Halbleib: Ramonita Lab Treating Kaydin Karbowski/Extender: Ricard Dillon Weeks in Treatment: 0 Abuse/Suicide Risk Screen Items Answer ABUSE/SUICIDE RISK SCREEN: Has anyone close to you tried to hurt or harm you recentlyo No Do you feel uncomfortable with anyone in your familyo No Has anyone forced you do things that you didnot want to doo No Do you have any thoughts of harming yourselfo No Patient displays signs or symptoms of abuse and/or neglect. No Electronic Signature(s) Signed: 12/16/2017 2:43:37 PM By: Alric Quan Entered By: Alric Quan on 12/15/2017 13:08:57 Shealy, Kelis L. ([id number]) -------------------------------------------------------------------------------- Activities of Daily Living Details Patient Name: [name], [name] Date of Service: 12/15/2017 12:30 PM Medical Record Number: [id number] Patient Account Number: [id number] Date of Birth/Sex: Aug 06, 1928 (84 y.o. Female) Treating RN: Carolyne Fiscal, Debi Primary Care Julieann Drummonds: Ramonita Lab Other Clinician: Referring Kyser Wandel: Ramonita Lab Treating Nikolay Demetriou/Extender: Ricard Dillon Weeks in Treatment: 0 Activities of Daily Living Items Answer Activities of Daily Living (Please select one for each item) Drive Automobile Not Able Take Medications Need Assistance Use Telephone Need Assistance Care for Appearance Need Assistance Use Toilet Need Assistance Bath / Shower Need Assistance Dress Self Need Assistance Feed Self Completely Able Walk Need Assistance Get In / Out Bed Need Assistance Housework Not Able Prepare Meals Not Able Handle Money Not Able Shop for  Self Not Able Electronic Signature(s) Signed: 12/16/2017 2:43:37 PM By: Alric Quan Entered By: Alric Quan on 12/15/2017 13:09:42 Kaczmarek, Sameerah Carlean Jews ([id number]) -------------------------------------------------------------------------------- Education Assessment Details Patient Name: [name], [name] Date of Service: 12/15/2017 12:30 PM Medical Record Number: [id number] Patient Account Number: [id number] Date of Birth/Sex: [birth year] (84 y.o. Female) Treating RN: Carolyne Fiscal, Debi Primary Care Jermel Artley: Ramonita Lab Other Clinician: Referring Hulet Ehrmann: Ramonita Lab Treating Hulet Ehrmann/Extender: Tito Dine in Treatment: 0 Primary Learner Assessed: Patient Learning Preferences/Education Level/Primary Language Learning Preference: Explanation, Printed Material Highest Education Level: College or Above Preferred Language: English Cognitive Barrier Assessment/Beliefs Language Barrier: No Translator Needed: No Memory Deficit: No Emotional Barrier: No Cultural/Religious Beliefs Affecting Medical Care: No Physical Barrier Assessment Impaired Vision: Yes Glasses Impaired Hearing: No Decreased Hand dexterity: No Knowledge/Comprehension Assessment Knowledge Level: Medium Comprehension Level: Medium Ability to understand written Medium instructions: Ability to understand verbal Medium instructions: Motivation Assessment Anxiety Level: Calm Cooperation: Cooperative Education Importance: Acknowledges Need Interest in Health Problems: Asks Questions Perception: Coherent Willingness to Engage in Self- Medium Management Activities: Readiness to Engage in Self- Medium Management Activities: Electronic Signature(s) Signed: 12/16/2017 2:43:37 PM By: Alric Quan Entered By: Alric Quan on 12/15/2017 13:10:10 Fritcher, Vernell Carlean Jews ([id number]) -------------------------------------------------------------------------------- Fall Risk Assessment Details Patient Name: [name],  [name] Date of Service: 12/15/2017 12:30 PM Medical Record Number: [id number] Patient Account Number: [id number] Date of Birth/Sex: 1928/09/11 (84 y.o. Female) Treating RN: Carolyne Fiscal, Debi Primary Care Jakaree Pickard: Ramonita Lab Other Clinician: Referring Chauncey Sciulli: Ramonita Lab Treating Deshay Blumenfeld/Extender: Ricard Dillon Weeks in Treatment: 0 Fall Risk Assessment Items Have you had 2 or more falls in the last 12 monthso 0 No Have you had any fall that resulted in injury in the last 12 monthso 0 No FALL RISK ASSESSMENT: History of falling - immediate or within 3 months 0 No Secondary diagnosis 15 Yes Ambulatory aid None/bed rest/wheelchair/nurse 0 Yes Crutches/cane/walker 0  No Furniture 0 No IV Access/Saline Lock 0 No Gait/Training Normal/bed rest/immobile 0 No Weak 10 Yes Impaired 20 Yes Mental Status Oriented to own ability 0 Yes Electronic Signature(s) Signed: 12/16/2017 2:43:37 PM By: Alric Quan Entered By: Alric Quan on 12/15/2017 13:10:55 Billy, Stela L. ([id number]) -------------------------------------------------------------------------------- Foot Assessment Details Patient Name: [name], [name] Date of Service: 12/15/2017 12:30 PM Medical Record Number: [id number] Patient Account Number: [id number] Date of Birth/Sex: 11-22-1928 (84 y.o. Female) Treating RN: Carolyne Fiscal, Debi Primary Care Tiegan Terpstra: Ramonita Lab Other Clinician: Referring Aashish Hamm: Ramonita Lab Treating Chaney Maclaren/Extender: Ricard Dillon Weeks in Treatment: 0 Foot Assessment Items Site Locations + = Sensation present, - = Sensation absent, C = Callus, U = Ulcer R = Redness, W = Warmth, M = Maceration, PU = Pre-ulcerative lesion F = Fissure, S = Swelling, D = Dryness Assessment Right: Left: Other Deformity: No No Prior Foot Ulcer: No No Prior Amputation: No No Charcot Joint: No No Ambulatory Status: Gait: Electronic Signature(s) Signed: 12/16/2017 2:43:37 PM By: Alric Quan Entered By:  Alric Quan on 12/15/2017 13:11:42 Feil, Ece L. ([id number]) -------------------------------------------------------------------------------- Nutrition Risk Assessment Details Patient Name: Biernat, Cherisa L. Date of Service: 12/15/2017 12:30 PM Medical Record Number: [id number] Patient Account Number: [id number] Date of Birth/Sex: 06/30/1929 (84 y.o. Female) Treating RN: Carolyne Fiscal, Debi Primary Care Deondrea Aguado: Ramonita Lab Other Clinician: Referring Efren Kross: Ramonita Lab Treating Chelli Yerkes/Extender: Ricard Dillon Weeks in Treatment: 0 Height (in): 63 Weight (lbs): 246 Body Mass Index (BMI): 43.6 Nutrition Risk Assessment Items NUTRITION RISK SCREEN: I have an illness or condition that made me change the kind and/or amount of 0 No food I eat I eat fewer than two meals per day 0 No I eat few fruits and vegetables, or milk products 0 No I have three or more drinks of beer, liquor or wine almost every day 0 No I have tooth or mouth problems that make it hard for me to eat 0 No I don't always have enough money to buy the food I need 0 No I eat alone most of the time 0 No I take three or more different prescribed or over-the-counter drugs a day 1 Yes Without wanting to, I have lost or gained 10 pounds in the last six months 0 No I am not always physically able to shop, cook and/or feed myself 0 No Nutrition Protocols Good Risk Protocol Moderate Risk Protocol Electronic Signature(s) Signed: 12/16/2017 2:43:37 PM By: Alric Quan Entered By: Alric Quan on 12/15/2017 13:11:33

## 2017-12-21 LAB — CULTURE, URINE COMPREHENSIVE

## 2017-12-22 ENCOUNTER — Encounter: Payer: Medicare Other | Admitting: Internal Medicine

## 2017-12-22 DIAGNOSIS — L89312 Pressure ulcer of right buttock, stage 2: Secondary | ICD-10-CM | POA: Diagnosis not present

## 2017-12-23 ENCOUNTER — Telehealth: Payer: Self-pay

## 2017-12-23 DIAGNOSIS — A498 Other bacterial infections of unspecified site: Secondary | ICD-10-CM

## 2017-12-23 MED ORDER — CEFUROXIME AXETIL 250 MG PO TABS: 250.0000 mg | ORAL_TABLET | Freq: Two times a day (BID) | ORAL | 0 refills | Status: AC

## 2017-12-23 NOTE — Telephone Encounter (Signed)
Called pt informed her of the information below. Pt gave verbal understanding. Per Dr.Stoioff Cefuroxime sent in as pt takes warfarin which interferes with Septra.

## 2017-12-23 NOTE — Telephone Encounter (Signed)
-----   Message from Abbie Sons, MD sent at 12/23/2017  9:00 AM EDT ----- Urine culture had a low level of bacteria.  She is scheduled for cystoscopy.  Please send Rx Septra DS 1 p.o. twice daily x5 days.

## 2017-12-25 NOTE — Progress Notes (Signed)
DOMINGUE, COLTRAIN ([id number]) Visit Report for 12/22/2017 Arrival Information Details Patient Name: Kantner, Hailee L. Date of Service: 12/22/2017 1:30 PM Medical Record Number: [id number] Patient Account Number: [id number] Date of Birth/Sex: 1928-09-22 (84 y.o. F) Treating RN: Ahmed Prima Primary Care Maxtyn Nuzum: Ramonita Lab Other Clinician: Referring Milaya Hora: Ramonita Lab Treating Nayana Lenig/Extender: Tito Dine in Treatment: 1 Visit Information History Since Last Visit All ordered tests and consults were completed: No Patient Arrived: Wheel Chair Added or deleted any medications: No Arrival Time: 13:36 Any new allergies or adverse reactions: No Accompanied By: son, husband Had a fall or experienced change in No Transfer Assistance: EasyPivot Patient activities of daily living that may affect Lift risk of falls: Patient Identification Verified: Yes Signs or symptoms of abuse/neglect since last visito No Secondary Verification Process Yes Hospitalized since last visit: No Completed: Implantable device outside of the clinic excluding No Patient Requires Transmission-Based No cellular tissue based products placed in the center Precautions: since last visit: Patient Has Alerts: Yes Has Dressing in Place as Prescribed: Yes Patient Alerts: Patient on Blood Pain Present Now: Yes Thinner DM II Warfarin Electronic Signature(s) Signed: 12/22/2017 3:54:16 PM By: Alric Quan Entered By: Alric Quan on 12/22/2017 13:37:01 [place], [place]. ([id number]) -------------------------------------------------------------------------------- Clinic Level of Care Assessment Details Patient Name: Baylock, Naamah L. Date of Service: 12/22/2017 1:30 PM Medical Record Number: [id number] Patient Account Number: [id number] Date of Birth/Sex: 02-15-1929 (84 y.o. F) Treating RN: Cornell Barman Primary Care Hussain Maimone: Ramonita Lab Other Clinician: Referring Folasade Mooty: Ramonita Lab Treating  Nicandro Perrault/Extender: Tito Dine in Treatment: 1 Clinic Level of Care Assessment Items TOOL 4 Quantity Score []  - Use when only an EandM is performed on FOLLOW-UP visit 0 ASSESSMENTS - Nursing Assessment / Reassessment X - Reassessment of Co-morbidities (includes updates in patient status) 1 10 X- 1 5 Reassessment of Adherence to Treatment Plan ASSESSMENTS - Wound and Skin Assessment / Reassessment []  - Simple Wound Assessment / Reassessment - one wound 0 X- 4 5 Complex Wound Assessment / Reassessment - multiple wounds []  - 0 Dermatologic / Skin Assessment (not related to wound area) ASSESSMENTS - Focused Assessment []  - Circumferential Edema Measurements - multi extremities 0 []  - 0 Nutritional Assessment / Counseling / Intervention []  - 0 Lower Extremity Assessment (monofilament, tuning fork, pulses) []  - 0 Peripheral Arterial Disease Assessment (using hand held doppler) ASSESSMENTS - Ostomy and/or Continence Assessment and Care []  - Incontinence Assessment and Management 0 []  - 0 Ostomy Care Assessment and Management (repouching, etc.) PROCESS - Coordination of Care []  - Simple Patient / Family Education for ongoing care 0 X- 1 20 Complex (extensive) Patient / Family Education for ongoing care X- 1 10 Staff obtains Programmer, systems, Records, Test Results / Process Orders []  - 0 Staff telephones HHA, Nursing Homes / Clarify orders / etc []  - 0 Routine Transfer to another Facility (non-emergent condition) []  - 0 Routine Hospital Admission (non-emergent condition) []  - 0 New Admissions / Biomedical engineer / Ordering NPWT, Apligraf, etc. []  - 0 Emergency Hospital Admission (emergent condition) []  - 0 Simple Discharge Coordination Roggenkamp, Kristain L. ([id number]) X- 1 15 Complex (extensive) Discharge Coordination PROCESS - Special Needs []  - Pediatric / Minor Patient Management 0 []  - 0 Isolation Patient Management []  - 0 Hearing / Language / Visual special  needs []  - 0 Assessment of Community assistance (transportation, D/C planning, etc.) []  - 0 Additional assistance / Altered mentation []  - 0 Support Surface(s) Assessment (bed, cushion, seat, etc.) INTERVENTIONS -  Wound Cleansing / Measurement []  - Simple Wound Cleansing - one wound 0 X- 4 5 Complex Wound Cleansing - multiple wounds X- 1 5 Wound Imaging (photographs - any number of wounds) []  - 0 Wound Tracing (instead of photographs) []  - 0 Simple Wound Measurement - one wound X- 4 5 Complex Wound Measurement - multiple wounds INTERVENTIONS - Wound Dressings []  - Small Wound Dressing one or multiple wounds 0 X- 4 15 Medium Wound Dressing one or multiple wounds []  - 0 Large Wound Dressing one or multiple wounds []  - 0 Application of Medications - topical []  - 0 Application of Medications - injection INTERVENTIONS - Miscellaneous []  - External ear exam 0 []  - 0 Specimen Collection (cultures, biopsies, blood, body fluids, etc.) []  - 0 Specimen(s) / Culture(s) sent or taken to Lab for analysis []  - 0 Patient Transfer (multiple staff / Civil Service fast streamer / Similar devices) []  - 0 Simple Staple / Suture removal (25 or less) []  - 0 Complex Staple / Suture removal (26 or more) []  - 0 Hypo / Hyperglycemic Management (close monitor of Blood Glucose) []  - 0 Ankle / Brachial Index (ABI) - do not check if billed separately X- 1 5 Vital Signs Akhter, Terrill L. ([id number]) Has the patient been seen at the hospital within the last three years: Yes Total Score: 190 Level Of Care: New/Established - Level 5 Electronic Signature(s) Signed: 12/23/2017 8:47:48 AM By: Gretta Cool, BSN, RN, CWS, Kim RN, BSN Entered By: Gretta Cool, BSN, RN, CWS, Kim on 12/22/2017 14:37:02 Chavarria, Gennie Alma ([id number]) -------------------------------------------------------------------------------- Encounter Discharge Information Details Patient Name: Brunelle, Aysel L. Date of Service: 12/22/2017 1:30 PM Medical Record Number:  [id number] Patient Account Number: [id number] Date of Birth/Sex: [birth year] (84 y.o. F) Treating RN: Montey Hora Primary Care Javone Ybanez: Ramonita Lab Other Clinician: Referring Ashvik Grundman: Ramonita Lab Treating Raneshia Derick/Extender: Tito Dine in Treatment: 1 Encounter Discharge Information Items Discharge Condition: Stable Ambulatory Status: Wheelchair Discharge Destination: Home Transportation: Private Auto Accompanied By: spouse Schedule Follow-up Appointment: Yes Clinical Summary of Care: Electronic Signature(s) Signed: 12/22/2017 3:21:09 PM By: Montey Hora Entered By: Montey Hora on 12/22/2017 15:21:08 Burstein, Alethea L. ([id number]) -------------------------------------------------------------------------------- Lower Extremity Assessment Details Patient Name: Giebel, Shante L. Date of Service: 12/22/2017 1:30 PM Medical Record Number: [id number] Patient Account Number: [id number] Date of Birth/Sex: October 09, 1928 (84 y.o. F) Treating RN: Ahmed Prima Primary Care Deeanne Deininger: Ramonita Lab Other Clinician: Referring Cason Dabney: Ramonita Lab Treating Aristidis Talerico/Extender: Ricard Dillon Weeks in Treatment: 1 Electronic Signature(s) Signed: 12/22/2017 3:54:16 PM By: Alric Quan Entered By: Alric Quan on 12/22/2017 14:02:29 Terrero, Carlisle ([id number]) -------------------------------------------------------------------------------- Multi Wound Chart Details Patient Name: Grimm, Dennice L. Date of Service: 12/22/2017 1:30 PM Medical Record Number: [id number] Patient Account Number: [id number] Date of Birth/Sex: [birth year] (84 y.o. F) Treating RN: Cornell Barman Primary Care Lilton Pare: Ramonita Lab Other Clinician: Referring Seon Gaertner: Ramonita Lab Treating Gladyce Mcray/Extender: Ricard Dillon Weeks in Treatment: 1 Vital Signs Height(in): 63 Pulse(bpm): 34 Weight(lbs): 246 Blood Pressure(mmHg): 147/68 Body Mass Index(BMI): 44 Temperature(F): 97.9 Respiratory  Rate 18 (breaths/min): Photos: [1:No Photos] [2:No Photos] [3:No Photos] Wound Location: [1:Right Gluteus] [2:Left Gluteus] [3:Left Gluteal fold] Wounding Event: [1:Pressure Injury] [2:Pressure Injury] [3:Pressure Injury] Primary Etiology: [1:Pressure Ulcer] [2:Pressure Ulcer] [3:Pressure Ulcer] Comorbid History: [1:Cataracts, Glaucoma, Hypertension, Type II Diabetes, Gout, Osteoarthritis, Dementia, Received Radiation] [2:Cataracts, Glaucoma, Hypertension, Type II Diabetes, Gout, Osteoarthritis, Dementia, Received Radiation] [3:Cataracts,  Glaucoma, Hypertension, Type II Diabetes, Gout, Osteoarthritis, Dementia, Received Radiation] Date Acquired: [1:09/14/2017] [2:09/14/2017] [3:09/14/2017] Weeks of Treatment: [1:1] [2:1] [3:1] Wound  Status: [1:Open] [2:Open] [3:Open] Clustered Wound: [1:No] [2:No] [3:No] Measurements L x W x D [1:1.7x0.9x0.2] [2:0.9x0.4x0.1] [3:1.9x0.6x0.1] (cm) Area (cm) : [1:1.202] [2:0.283] [3:0.895] Volume (cm) : [1:0.24] [2:0.028] [3:0.09] % Reduction in Area: [1:-50.10%] [2:-80.30%] [3:-1.70%] % Reduction in Volume: [1:-200.00%] [2:-75.00%] [3:48.90%] Classification: [1:Category/Stage II] [2:Category/Stage II] [3:Category/Stage III] Exudate Amount: [1:Large] [2:Large] [3:Large] Exudate Type: [1:Serous] [2:Serous] [3:Serous] Exudate Color: [1:amber] [2:amber] [3:amber] Wound Margin: [1:Distinct, outline attached] [2:Distinct, outline attached] [3:Distinct, outline attached] Granulation Amount: [1:Large (67-100%)] [2:Large (67-100%)] [3:Medium (34-66%)] Granulation Quality: [1:Red] [2:Red] [3:Red] Necrotic Amount: [1:Small (1-33%)] [2:Small (1-33%)] [3:Medium (34-66%)] Exposed Structures: [1:Fascia: No Fat Layer (Subcutaneous Tissue) Exposed: No Tendon: No Muscle: No Joint: No Bone: No] [2:Fascia: No Fat Layer (Subcutaneous Tissue) Exposed: No Tendon: No Muscle: No Joint: No Bone: No] [3:Fat Layer (Subcutaneous Tissue) Exposed: Yes  Fascia: No Tendon: No Muscle: No  Joint: No Bone: No] Epithelialization: [1:None] [2:Large (67-100%)] [3:None] Periwound Skin Texture: [1:No Abnormalities Noted] [2:No Abnormalities Noted] [3:No Abnormalities Noted] Periwound Skin Moisture: [1:No Abnormalities Noted] [2:No Abnormalities Noted] [3:No Abnormalities Noted] Periwound Skin Color: [2:Ecchymosis: Yes] [3:Ecchymosis: Yes] Ecchymosis: Yes Erythema: No Temperature: No Abnormality No Abnormality No Abnormality Tenderness on Palpation: Yes Yes Yes Wound Preparation: Ulcer Cleansing: Ulcer Cleansing: Ulcer Cleansing: Rinsed/Irrigated with Saline Rinsed/Irrigated with Saline Rinsed/Irrigated with Saline Topical Anesthetic Applied: Topical Anesthetic Applied: Topical Anesthetic Applied: Other: lidocaine 4% Other: lidocaine 4% Other: lidocaine 4% Wound Number: [id number]  Photos: No Photos No Photos No Photos Wound Location: Left Upper Leg - Posterior Left Labia Right Gluteal fold Wounding Event: Pressure Injury Pressure Injury Pressure Injury Primary Etiology: Pressure Ulcer Pressure Ulcer Pressure Ulcer Comorbid History: Cataracts, Glaucoma, Cataracts, Glaucoma, Cataracts, Glaucoma, Hypertension, Type II Hypertension, Type II Hypertension, Type II Diabetes, Gout, Osteoarthritis, Diabetes, Gout, Osteoarthritis, Diabetes, Gout, Osteoarthritis, Dementia, Received Radiation Dementia, Received Radiation Dementia, Received Radiation Date Acquired: 09/14/2017 09/14/2017 09/14/2017 Weeks of Treatment: 1 1 1  Wound Status: Open Open Open Clustered Wound: Yes No No Measurements L x W x D 0.2x0.5x0.1 1.6x0.7x0.1 2.3x1.9x0.8 (cm) Area (cm) : 0.079 0.88 3.432 Volume (cm) : 0.008 0.088 2.746 % Reduction in Area: 84.00% 26.80% -56.60% % Reduction in Volume: 83.70% 26.70% -526.90% Classification: Category/Stage II Category/Stage II Category/Stage II Exudate Amount: Medium Large Large Exudate Type: Serous Serous Serous Exudate Color: amber amber amber Wound Margin: Distinct,  outline attached Distinct, outline attached Distinct, outline attached Granulation Amount: Large (67-100%) Large (67-100%) Large (67-100%) Granulation Quality: Red Red Red Necrotic Amount: Small (1-33%) Small (1-33%) Small (1-33%) Exposed Structures: Fascia: No Fascia: No Fascia: No Fat Layer (Subcutaneous Fat Layer (Subcutaneous Fat Layer (Subcutaneous Tissue) Exposed: No Tissue) Exposed: No Tissue) Exposed: No Tendon: No Tendon: No Tendon: No Muscle: No Muscle: No Muscle: No Joint: No Joint: No Joint: No Bone: No Bone: No Bone: No Epithelialization: None None None Periwound Skin Texture: No Abnormalities Noted No Abnormalities Noted No Abnormalities Noted Periwound Skin Moisture: No Abnormalities Noted No Abnormalities Noted No Abnormalities Noted Periwound Skin Color: Ecchymosis: Yes Ecchymosis: Yes Ecchymosis: Yes Temperature: No Abnormality No Abnormality No Abnormality Tenderness on Palpation: Yes Yes Yes Wound Preparation: Ulcer Cleansing: Ulcer Cleansing: Ulcer Cleansing: Rinsed/Irrigated with Saline Rinsed/Irrigated with Saline Rinsed/Irrigated with Saline Topical Anesthetic Applied: Topical Anesthetic Applied: Topical Anesthetic Applied: Other: lidocaine 4% Other: lidocaine 4% Other: lidocaine 4% Wound Number: 7 N/A N/A Meschke, Elliana L. ([id number]) Photos: No Photos N/A N/A Wound Location: Right Upper Leg N/A N/A Wounding Event: Gradually Appeared N/A N/A Primary Etiology: Fungal N/A N/A Comorbid History: Cataracts, Glaucoma, N/A N/A Hypertension,  Type II Diabetes, Gout, Osteoarthritis, Dementia, Received Radiation Date Acquired: 07/13/2017 N/A N/A Weeks of Treatment: 1 N/A N/A Wound Status: Open N/A N/A Clustered Wound: No N/A N/A Measurements L x W x D 2x1.8x0.1 N/A N/A (cm) Area (cm) : 2.827 N/A N/A Volume (cm) : 0.283 N/A N/A % Reduction in Area: -33.30% N/A N/A % Reduction in Volume: -33.50% N/A N/A Classification: Full Thickness Without  N/A N/A Exposed Support Structures Exudate Amount: Large N/A N/A Exudate Type: Serous N/A N/A Exudate Color: amber N/A N/A Wound Margin: Flat and Intact N/A N/A Granulation Amount: Large (67-100%) N/A N/A Granulation Quality: Red, Hyper-granulation N/A N/A Necrotic Amount: Small (1-33%) N/A N/A Exposed Structures: Fat Layer (Subcutaneous N/A N/A Tissue) Exposed: Yes Fascia: No Tendon: No Muscle: No Joint: No Bone: No Epithelialization: None N/A N/A Periwound Skin Texture: Excoriation: Yes N/A N/A Rash: Yes Induration: No Callus: No Crepitus: No Scarring: No Periwound Skin Moisture: Maceration: No N/A N/A Dry/Scaly: No Periwound Skin Color: Atrophie Blanche: No N/A N/A Cyanosis: No Ecchymosis: No Erythema: No Hemosiderin Staining: No Mottled: No Pallor: No Rubor: No Temperature: No Abnormality N/A N/A Tenderness on Palpation: Yes N/A N/A Wound Preparation: Ulcer Cleansing: N/A N/A Rinsed/Irrigated with Saline Topical Anesthetic Applied: Other: lidocaine 4% Zaugg, SANYIA DINI ([id number]) Treatment Notes Electronic Signature(s) Signed: 12/23/2017 8:47:48 AM By: Gretta Cool, BSN, RN, CWS, Kim RN, BSN Entered By: Gretta Cool, BSN, RN, CWS, Kim on 12/22/2017 14:29:00 Paola, Gennie Alma ([id number]) -------------------------------------------------------------------------------- Multi-Disciplinary Care Plan Details Patient Name: Tupper, Zivah L. Date of Service: 12/22/2017 1:30 PM Medical Record Number: [id number] Patient Account Number: [id number] Date of Birth/Sex: 01-14-1929 (84 y.o. F) Treating RN: Cornell Barman Primary Care Nickalous Stingley: Ramonita Lab Other Clinician: Referring Kaizlee Carlino: Ramonita Lab Treating Oksana Deberry/Extender: Tito Dine in Treatment: 1 Active Inactive ` Orientation to the Wound Care Program Nursing Diagnoses: Knowledge deficit related to the wound healing center program Goals: Patient/caregiver will verbalize understanding of the [place]  Program Date Initiated: 12/15/2017 Target Resolution Date: 12/15/2017 Goal Status: Active Interventions: Provide education on orientation to the wound center Notes: ` Pressure Nursing Diagnoses: Knowledge deficit related to management of pressures ulcers Goals: Patient/caregiver will verbalize understanding of pressure ulcer management Date Initiated: 12/15/2017 Target Resolution Date: 12/16/2017 Goal Status: Active Interventions: Assess: immobility, friction, shearing, incontinence upon admission and as needed Notes: ` Wound/Skin Impairment Nursing Diagnoses: Knowledge deficit related to ulceration/compromised skin integrity Goals: Ulcer/skin breakdown will have a volume reduction of 80% by week 12 Date Initiated: 12/15/2017 Target Resolution Date: 06/16/2018 Goal Status: Active Interventions: Heuer, Nubia L. ([id number]) Assess patient/caregiver ability to obtain necessary supplies Provide education on ulcer and skin care Treatment Activities: Topical wound management initiated : 12/15/2017 Notes: Electronic Signature(s) Signed: 12/23/2017 8:47:48 AM By: Gretta Cool, BSN, RN, CWS, Kim RN, BSN Entered By: Gretta Cool, BSN, RN, CWS, Kim on 12/22/2017 14:28:47 Kennan, Torryn Carlean Jews ([id number]) -------------------------------------------------------------------------------- Pain Assessment Details Patient Name: Dy, Jennaya L. Date of Service: 12/22/2017 1:30 PM Medical Record Number: [id number] Patient Account Number: [id number] Date of Birth/Sex: 1929/06/05 (84 y.o. F) Treating RN: Ahmed Prima Primary Care Amiaya Mcneeley: Ramonita Lab Other Clinician: Referring Aleasha Fregeau: Ramonita Lab Treating Kahlea Cobert/Extender: Ricard Dillon Weeks in Treatment: 1 Active Problems Location of Pain Severity and Description of Pain Patient Has Paino Yes Site Locations Pain Location: Pain in Ulcers Rate the pain. Current Pain Level: 5 Character of Pain Describe the Pain: Aching, Burning Pain Management and  Medication Current Pain Management: Electronic Signature(s) Signed: 12/22/2017 3:54:16 PM By: Alric Quan Entered By: Alric Quan  on 12/22/2017 13:37:42 Rinks, Saidah L. ([id number]) -------------------------------------------------------------------------------- Patient/Caregiver Education Details Patient Name: Gornick, Fatisha L. Date of Service: 12/22/2017 1:30 PM Medical Record Number: [id number] Patient Account Number: [id number] Date of Birth/Gender: Nov 18, 1928 (84 y.o. F) Treating RN: Montey Hora Primary Care Physician: Ramonita Lab Other Clinician: Referring Physician: Ramonita Lab Treating Physician/Extender: Tito Dine in Treatment: 1 Education Assessment Education Provided To: Patient Education Topics Provided Wound/Skin Impairment: Handouts: Other: wound care as ordered Methods: Demonstration, Explain/Verbal Responses: State content correctly Electronic Signature(s) Signed: 12/22/2017 3:50:46 PM By: Montey Hora Entered By: Montey Hora on 12/22/2017 15:22:03 Osborn, [place] ([id number]) -------------------------------------------------------------------------------- Wound Assessment Details Patient Name: Agresti, Yatzary L. Date of Service: 12/22/2017 1:30 PM Medical Record Number: [id number] Patient Account Number: [id number] Date of Birth/Sex: 1928-10-21 (84 y.o. F) Treating RN: Ahmed Prima Primary Care Loyal Holzheimer: Ramonita Lab Other Clinician: Referring Schae Cando: Ramonita Lab Treating Kahari Critzer/Extender: Ricard Dillon Weeks in Treatment: 1 Wound Status Wound Number: 1 Primary Pressure Ulcer Etiology: Wound Location: Right Gluteus Wound Open Wounding Event: Pressure Injury Status: Date Acquired: 09/14/2017 Comorbid Cataracts, Glaucoma, Hypertension, Type II Weeks Of Treatment: 1 History: Diabetes, Gout, Osteoarthritis, Dementia, Clustered Wound: No Received Radiation Photos Photo Uploaded By: Alric Quan on 12/22/2017 15:46:00 Wound  Measurements Length: (cm) 1.7 Width: (cm) 0.9 Depth: (cm) 0.2 Area: (cm) 1.202 Volume: (cm) 0.24 % Reduction in Area: -50.1% % Reduction in Volume: -200% Epithelialization: None Tunneling: No Undermining: No Wound Description Classification: Category/Stage II Wound Margin: Distinct, outline attached Exudate Amount: Large Exudate Type: Serous Exudate Color: amber Foul Odor After Cleansing: No Slough/Fibrino Yes Wound Bed Granulation Amount: Large (67-100%) Exposed Structure Granulation Quality: Red Fascia Exposed: No Necrotic Amount: Small (1-33%) Fat Layer (Subcutaneous Tissue) Exposed: No Necrotic Quality: Adherent Slough Tendon Exposed: No Muscle Exposed: No Joint Exposed: No Bone Exposed: No Periwound Skin Texture Texture Color No Abnormalities Noted: No No Abnormalities Noted: No Shorts, Mozella L. ([id number]) Moisture Ecchymosis: Yes No Abnormalities Noted: No Erythema: No Temperature / Pain Temperature: No Abnormality Tenderness on Palpation: Yes Wound Preparation Ulcer Cleansing: Rinsed/Irrigated with Saline Topical Anesthetic Applied: Other: lidocaine 4%, Treatment Notes Wound #1 (Right Gluteus) 1. Cleansed with: Clean wound with Normal Saline 3. Peri-wound Care: Antifungal cream 4. Dressing Applied: Other dressing (specify in notes) 5. Secondary Dressing Applied ABD Pad Notes silvercel, secured with undergarment Electronic Signature(s) Signed: 12/22/2017 3:54:16 PM By: Alric Quan Entered By: Alric Quan on 12/22/2017 13:59:01 Trippe, Luana L. ([id number]) -------------------------------------------------------------------------------- Wound Assessment Details Patient Name: Gravett, Jema L. Date of Service: 12/22/2017 1:30 PM Medical Record Number: [id number] Patient Account Number: [id number] Date of Birth/Sex: October 22, 1928 (84 y.o. F) Treating RN: Ahmed Prima Primary Care Bronnie Vasseur: Ramonita Lab Other Clinician: Referring Pacey Willadsen:  Ramonita Lab Treating Husna Krone/Extender: Ricard Dillon Weeks in Treatment: 1 Wound Status Wound Number: 2 Primary Pressure Ulcer Etiology: Wound Location: Left Gluteus Wound Open Wounding Event: Pressure Injury Status: Date Acquired: 09/14/2017 Comorbid Cataracts, Glaucoma, Hypertension, Type II Weeks Of Treatment: 1 History: Diabetes, Gout, Osteoarthritis, Dementia, Clustered Wound: No Received Radiation Photos Photo Uploaded By: Alric Quan on 12/22/2017 15:46:00 Wound Measurements Length: (cm) 0.9 Width: (cm) 0.4 Depth: (cm) 0.1 Area: (cm) 0.283 Volume: (cm) 0.028 % Reduction in Area: -80.3% % Reduction in Volume: -75% Epithelialization: Large (67-100%) Tunneling: No Undermining: No Wound Description Classification: Category/Stage II Wound Margin: Distinct, outline attached Exudate Amount: Large Exudate Type: Serous Exudate Color: amber Foul Odor After Cleansing: No Slough/Fibrino No Wound Bed Granulation Amount: Large (67-100%) Exposed Structure Granulation Quality: Red Fascia Exposed: No Necrotic  Amount: Small (1-33%) Fat Layer (Subcutaneous Tissue) Exposed: No Necrotic Quality: Adherent Slough Tendon Exposed: No Muscle Exposed: No Joint Exposed: No Bone Exposed: No Periwound Skin Texture Texture Color No Abnormalities Noted: No No Abnormalities Noted: No Mervin, Jaylanni L. ([id number]) Moisture Ecchymosis: Yes No Abnormalities Noted: No Temperature / Pain Temperature: No Abnormality Tenderness on Palpation: Yes Wound Preparation Ulcer Cleansing: Rinsed/Irrigated with Saline Topical Anesthetic Applied: Other: lidocaine 4%, Treatment Notes Wound #2 (Left Gluteus) 1. Cleansed with: Clean wound with Normal Saline 3. Peri-wound Care: Antifungal cream 4. Dressing Applied: Other dressing (specify in notes) 5. Secondary Dressing Applied ABD Pad Notes silvercel, secured with undergarment Electronic Signature(s) Signed: 12/22/2017 3:54:16 PM  By: Alric Quan Entered By: Alric Quan on 12/22/2017 13:59:24 Mcwherter, Wendelin L. ([id number]) -------------------------------------------------------------------------------- Wound Assessment Details Patient Name: Mccroskey, Wiletta L. Date of Service: 12/22/2017 1:30 PM Medical Record Number: [id number] Patient Account Number: [id number] Date of Birth/Sex: 03/14/1929 (84 y.o. F) Treating RN: Ahmed Prima Primary Care Damontre Millea: Ramonita Lab Other Clinician: Referring Tiffiny Worthy: Ramonita Lab Treating Dawnelle Warman/Extender: Ricard Dillon Weeks in Treatment: 1 Wound Status Wound Number: 3 Primary Pressure Ulcer Etiology: Wound Location: Left Gluteal fold Wound Open Wounding Event: Pressure Injury Status: Date Acquired: 09/14/2017 Comorbid Cataracts, Glaucoma, Hypertension, Type II Weeks Of Treatment: 1 History: Diabetes, Gout, Osteoarthritis, Dementia, Clustered Wound: No Received Radiation Photos Photo Uploaded By: Alric Quan on 12/22/2017 15:46:15 Wound Measurements Length: (cm) 1.9 Width: (cm) 0.6 Depth: (cm) 0.1 Area: (cm) 0.895 Volume: (cm) 0.09 % Reduction in Area: -1.7% % Reduction in Volume: 48.9% Epithelialization: None Tunneling: No Undermining: No Wound Description Classification: Category/Stage III Wound Margin: Distinct, outline attached Exudate Amount: Large Exudate Type: Serous Exudate Color: amber Wound Bed Granulation Amount: Medium (34-66%) Exposed Structure Granulation Quality: Red Fascia Exposed: No Necrotic Amount: Medium (34-66%) Fat Layer (Subcutaneous Tissue) Exposed: Yes Necrotic Quality: Adherent Slough Tendon Exposed: No Muscle Exposed: No Joint Exposed: No Bone Exposed: No Periwound Skin Texture Texture Color No Abnormalities Noted: No No Abnormalities Noted: No Gullett, Cabrina L. ([id number]) Moisture Ecchymosis: Yes No Abnormalities Noted: No Temperature / Pain Temperature: No Abnormality Tenderness on Palpation: Yes Wound  Preparation Ulcer Cleansing: Rinsed/Irrigated with Saline Topical Anesthetic Applied: Other: lidocaine 4%, Treatment Notes Wound #3 (Left Gluteal fold) 1. Cleansed with: Clean wound with Normal Saline 3. Peri-wound Care: Antifungal cream 4. Dressing Applied: Other dressing (specify in notes) 5. Secondary Dressing Applied ABD Pad Notes silvercel, secured with undergarment Electronic Signature(s) Signed: 12/22/2017 3:54:16 PM By: Alric Quan Entered By: Alric Quan on 12/22/2017 13:59:45 Rappaport, Chelsye L. ([id number]) -------------------------------------------------------------------------------- Wound Assessment Details Patient Name: Achor, Aniqa L. Date of Service: 12/22/2017 1:30 PM Medical Record Number: [id number] Patient Account Number: [id number] Date of Birth/Sex: [birth year] (84 y.o. F) Treating RN: Ahmed Prima Primary Care Naesha Buckalew: Ramonita Lab Other Clinician: Referring Colisha Redler: Ramonita Lab Treating Paxten Appelt/Extender: Ricard Dillon Weeks in Treatment: 1 Wound Status Wound Number: 4 Primary Pressure Ulcer Etiology: Wound Location: Left Upper Leg - Posterior Wound Open Wounding Event: Pressure Injury Status: Date Acquired: 09/14/2017 Comorbid Cataracts, Glaucoma, Hypertension, Type II Weeks Of Treatment: 1 History: Diabetes, Gout, Osteoarthritis, Dementia, Clustered Wound: Yes Received Radiation Photos Photo Uploaded By: Alric Quan on 12/22/2017 15:46:43 Wound Measurements Length: (cm) 0.2 Width: (cm) 0.5 Depth: (cm) 0.1 Area: (cm) 0.079 Volume: (cm) 0.008 % Reduction in Area: 84% % Reduction in Volume: 83.7% Epithelialization: None Tunneling: No Undermining: No Wound Description Classification: Category/Stage II Wound Margin: Distinct, outline attached Exudate Amount: Medium Exudate Type: Serous Exudate Color: amber Foul Odor  After Cleansing: No Slough/Fibrino Yes Wound Bed Granulation Amount: Large (67-100%) Exposed  Structure Granulation Quality: Red Fascia Exposed: No Necrotic Amount: Small (1-33%) Fat Layer (Subcutaneous Tissue) Exposed: No Necrotic Quality: Adherent Slough Tendon Exposed: No Muscle Exposed: No Joint Exposed: No Bone Exposed: No Periwound Skin Texture Texture Color No Abnormalities Noted: No No Abnormalities Noted: No Chastang, Freddy L. ([id number]) Moisture Ecchymosis: Yes No Abnormalities Noted: No Temperature / Pain Temperature: No Abnormality Tenderness on Palpation: Yes Wound Preparation Ulcer Cleansing: Rinsed/Irrigated with Saline Topical Anesthetic Applied: Other: lidocaine 4%, Treatment Notes Wound #4 (Left, Posterior Upper Leg) 1. Cleansed with: Clean wound with Normal Saline 3. Peri-wound Care: Antifungal cream 4. Dressing Applied: Other dressing (specify in notes) 5. Secondary Dressing Applied ABD Pad Notes silvercel, secured with undergarment Electronic Signature(s) Signed: 12/22/2017 3:54:16 PM By: Alric Quan Entered By: Alric Quan on 12/22/2017 14:00:41 Skarzynski, Shanya L. ([id number]) -------------------------------------------------------------------------------- Wound Assessment Details Patient Name: Finazzo, Glena L. Date of Service: 12/22/2017 1:30 PM Medical Record Number: [id number] Patient Account Number: [id number] Date of Birth/Sex: [birth year] (84 y.o. F) Treating RN: Ahmed Prima Primary Care Jahmere Bramel: Ramonita Lab Other Clinician: Referring Lysha Schrade: Ramonita Lab Treating Vardaan Depascale/Extender: Ricard Dillon Weeks in Treatment: 1 Wound Status Wound Number: 5 Primary Pressure Ulcer Etiology: Wound Location: Left Labia Wound Open Wounding Event: Pressure Injury Status: Date Acquired: 09/14/2017 Comorbid Cataracts, Glaucoma, Hypertension, Type II Weeks Of Treatment: 1 History: Diabetes, Gout, Osteoarthritis, Dementia, Clustered Wound: No Received Radiation Photos Photo Uploaded By: Alric Quan on 12/22/2017 15:46:43 Wound  Measurements Length: (cm) 1.6 Width: (cm) 0.7 Depth: (cm) 0.1 Area: (cm) 0.88 Volume: (cm) 0.088 % Reduction in Area: 26.8% % Reduction in Volume: 26.7% Epithelialization: None Tunneling: No Undermining: No Wound Description Classification: Category/Stage II Wound Margin: Distinct, outline attached Exudate Amount: Large Exudate Type: Serous Exudate Color: amber Foul Odor After Cleansing: No Slough/Fibrino Yes Wound Bed Granulation Amount: Large (67-100%) Exposed Structure Granulation Quality: Red Fascia Exposed: No Necrotic Amount: Small (1-33%) Fat Layer (Subcutaneous Tissue) Exposed: No Necrotic Quality: Adherent Slough Tendon Exposed: No Muscle Exposed: No Joint Exposed: No Bone Exposed: No Periwound Skin Texture Texture Color No Abnormalities Noted: No No Abnormalities Noted: No Eickholt, Larisa L. ([id number]) Moisture Ecchymosis: Yes No Abnormalities Noted: No Temperature / Pain Temperature: No Abnormality Tenderness on Palpation: Yes Wound Preparation Ulcer Cleansing: Rinsed/Irrigated with Saline Topical Anesthetic Applied: Other: lidocaine 4%, Treatment Notes Wound #5 (Left Labia) 1. Cleansed with: Clean wound with Normal Saline 3. Peri-wound Care: Antifungal cream 4. Dressing Applied: Other dressing (specify in notes) 5. Secondary Dressing Applied ABD Pad Notes silvercel, secured with undergarment Electronic Signature(s) Signed: 12/22/2017 3:54:16 PM By: Alric Quan Entered By: Alric Quan on 12/22/2017 14:01:01 Ricciardi, Sahra L. ([id number]) -------------------------------------------------------------------------------- Wound Assessment Details Patient Name: Hingle, Shuntay L. Date of Service: 12/22/2017 1:30 PM Medical Record Number: [id number] Patient Account Number: [id number] Date of Birth/Sex: January 02, 1929 (84 y.o. F) Treating RN: Ahmed Prima Primary Care Athea Haley: Ramonita Lab Other Clinician: Referring Renaye Janicki: Ramonita Lab Treating  Yazlynn Birkeland/Extender: Ricard Dillon Weeks in Treatment: 1 Wound Status Wound Number: 6 Primary Pressure Ulcer Etiology: Wound Location: Right Gluteal fold Wound Open Wounding Event: Pressure Injury Status: Date Acquired: 09/14/2017 Comorbid Cataracts, Glaucoma, Hypertension, Type II Weeks Of Treatment: 1 History: Diabetes, Gout, Osteoarthritis, Dementia, Clustered Wound: No Received Radiation Photos Photo Uploaded By: Alric Quan on 12/22/2017 15:47:17 Wound Measurements Length: (cm) 2.3 Width: (cm) 1.9 Depth: (cm) 0.8 Area: (cm) 3.432 Volume: (cm) 2.746 % Reduction in Area: -56.6% % Reduction in Volume: -526.9% Epithelialization:  None Tunneling: No Undermining: No Wound Description Classification: Category/Stage II Wound Margin: Distinct, outline attached Exudate Amount: Large Exudate Type: Serous Exudate Color: amber Foul Odor After Cleansing: No Slough/Fibrino Yes Wound Bed Granulation Amount: Large (67-100%) Exposed Structure Granulation Quality: Red Fascia Exposed: No Necrotic Amount: Small (1-33%) Fat Layer (Subcutaneous Tissue) Exposed: No Necrotic Quality: Adherent Slough Tendon Exposed: No Muscle Exposed: No Joint Exposed: No Bone Exposed: No Periwound Skin Texture Texture Color No Abnormalities Noted: No No Abnormalities Noted: No Brum, Carling L. ([id number]) Moisture Ecchymosis: Yes No Abnormalities Noted: No Temperature / Pain Temperature: No Abnormality Tenderness on Palpation: Yes Wound Preparation Ulcer Cleansing: Rinsed/Irrigated with Saline Topical Anesthetic Applied: Other: lidocaine 4%, Treatment Notes Wound #6 (Right Gluteal fold) 1. Cleansed with: Clean wound with Normal Saline 3. Peri-wound Care: Antifungal cream 4. Dressing Applied: Other dressing (specify in notes) 5. Secondary Dressing Applied ABD Pad Notes silvercel, secured with undergarment Electronic Signature(s) Signed: 12/22/2017 3:54:16 PM By: Alric Quan Entered By: Alric Quan on 12/22/2017 14:01:23 Coltrin, Shaquilla L. ([id number]) -------------------------------------------------------------------------------- Wound Assessment Details Patient Name: Moan, Cadynce L. Date of Service: 12/22/2017 1:30 PM Medical Record Number: [id number] Patient Account Number: [id number] Date of Birth/Sex: [birth year] (84 y.o. F) Treating RN: Ahmed Prima Primary Care Torrie Namba: Ramonita Lab Other Clinician: Referring Rashaud Ybarbo: Ramonita Lab Treating Byrdie Miyazaki/Extender: Ricard Dillon Weeks in Treatment: 1 Wound Status Wound Number: 7 Primary Fungal Etiology: Wound Location: Right Upper Leg Wound Open Wounding Event: Gradually Appeared Status: Date Acquired: 07/13/2017 Comorbid Cataracts, Glaucoma, Hypertension, Type II Weeks Of Treatment: 1 History: Diabetes, Gout, Osteoarthritis, Dementia, Clustered Wound: No Received Radiation Photos Photo Uploaded By: Alric Quan on 12/22/2017 15:47:17 Wound Measurements Length: (cm) 2 Width: (cm) 1.8 Depth: (cm) 0.1 Area: (cm) 2.827 Volume: (cm) 0.283 % Reduction in Area: -33.3% % Reduction in Volume: -33.5% Epithelialization: None Tunneling: No Undermining: No Wound Description Full Thickness Without Exposed Support Classification: Structures Wound Margin: Flat and Intact Exudate Large Amount: Exudate Type: Serous Exudate Color: amber Foul Odor After Cleansing: No Slough/Fibrino No Wound Bed Granulation Amount: Large (67-100%) Exposed Structure Granulation Quality: Red, Hyper-granulation Fascia Exposed: No Necrotic Amount: Small (1-33%) Fat Layer (Subcutaneous Tissue) Exposed: Yes Necrotic Quality: Adherent Slough Tendon Exposed: No Muscle Exposed: No Joint Exposed: No Bone Exposed: No Periwound Skin Texture Texture Color Sporer, Charlcie L. ([id number]) No Abnormalities Noted: No No Abnormalities Noted: No Callus: No Atrophie Blanche: No Crepitus: No Cyanosis:  No Excoriation: Yes Ecchymosis: No Induration: No Erythema: No Rash: Yes Hemosiderin Staining: No Scarring: No Mottled: No Pallor: No Moisture Rubor: No No Abnormalities Noted: No Dry / Scaly: No Temperature / Pain Maceration: No Temperature: No Abnormality Tenderness on Palpation: Yes Wound Preparation Ulcer Cleansing: Rinsed/Irrigated with Saline Topical Anesthetic Applied: Other: lidocaine 4%, Treatment Notes Wound #7 (Right Upper Leg) 1. Cleansed with: Clean wound with Normal Saline 3. Peri-wound Care: Antifungal cream 4. Dressing Applied: Other dressing (specify in notes) 5. Secondary Dressing Applied ABD Pad Notes silvercel, secured with undergarment Electronic Signature(s) Signed: 12/22/2017 3:54:16 PM By: Alric Quan Entered By: Alric Quan on 12/22/2017 14:02:15 Laughridge, Kalandra L. ([id number]) -------------------------------------------------------------------------------- Vitals Details Patient Name: Woodford, Mckell L. Date of Service: 12/22/2017 1:30 PM Medical Record Number: [id number] Patient Account Number: [id number] Date of Birth/Sex: Sep 06, 1928 (84 y.o. F) Treating RN: Ahmed Prima Primary Care Kenshin Splawn: Ramonita Lab Other Clinician: Referring Anndrea Mihelich: Ramonita Lab Treating Reygan Heagle/Extender: Tito Dine in Treatment: 1 Vital Signs Time Taken: 13:37 Temperature (F): 97.9 Height (in): 63 Pulse (bpm): 82 Weight (lbs): 246 Respiratory  Rate (breaths/min): 18 Body Mass Index (BMI): 43.6 Blood Pressure (mmHg): 147/68 Reference Range: 80 - 120 mg / dl Electronic Signature(s) Signed: 12/22/2017 3:54:16 PM By: Alric Quan Entered By: Alric Quan on 12/22/2017 13:43:56

## 2017-12-25 NOTE — Progress Notes (Signed)
[name], [name] ([id number]) Visit Report for 12/22/2017 HPI Details Patient Name: [name], [name]. Date of Service: 12/22/2017 1:30 PM Medical Record Number: [id number] Patient Account Number: [id number] Date of Birth/Sex: [birth year] (84 y.o. F) Treating RN: Cornell Barman Primary Care Provider: Ramonita Lab Other Clinician: Referring Provider: Ramonita Lab Treating Provider/Extender: Tito Dine in Treatment: 1 History of Present Illness HPI Description: ADMISSION 12/15/17; this is an 84 year old woman who lives at home with her husband. She is referred here from her dermatologist Dr. Sarina Ser for review of pressure areas on her bilateral buttock's gluteal folds left ischial tuberosity and labia. She also has absolutely horrible tinea/intertrigo involving her lower abdomen, thighs, peri-area. This is also present in her right inframammary area. The patient is immobilized and her husband's description this is actually been getting worse. 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 She is mostly confined to wheelchair at home and sleeps in a recliner at night and has done this for many years. Not able to get a history of urinary incontinence. Currently they are using cortisone and a and D cream to the wounds and he apparently has ketoconazole to the large areas of intertrigo. She was hospitalized in April with increased knee pain as well as scrotal irritation from tinea/[place] infection. 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 I'm not sure exactly when this was started over this is a predictable interaction which almost precludes joint use of these treatments. She has 6 pressure ulcers on the right and left gluteal folds, upper right and left buttock. Left labia. A stage III wound over the left ischial tuberosity. She also has an area on the  right interior upper thigh just low the gluteal fold. And an area on the right anterior thigh. She has absolutely terrible intertrigo which could be candidal or could be tineal. She also probably has candidal dermatitis under the right greater than left breast. She is in a lot of discomfort with movement. Her husband tells me he is exhausted with the care needs of his wife currently. 12/22/17; since the patient was last here she is only had 2 of the daily doses of Diflucan 100 mg. I did spoken to her primary physician Dr. Caryl Comes he was not comfortable with stopping the Coumadin and therefore substituted Xarelto. This will allow daily dose of Diflucan for 2 weeks. She may require more than this. He is also using topical ketoconazole which I'll prescribe today The patient also has pressure ulcerations on her bilateral buttocks, right ischial tuberosity, left gluteal fold. There is also an ulceration on the posterior part of her labia which is concerning. Electronic Signature(s) Signed: 12/22/2017 4:49:36 PM By: Linton Ham MD Entered By: Linton Ham on 12/22/2017 16:18:07 [name], [name] ([id number]) -------------------------------------------------------------------------------- Physical Exam Details Patient Name: [name], [name]. Date of Service: 12/22/2017 1:30 PM Medical Record Number: [id number] Patient Account Number: [id number] Date of Birth/Sex: [birth year] (84 y.o. F) Treating RN: Cornell Barman Primary Care Provider: Ramonita Lab Other Clinician: Referring Provider: Ramonita Lab Treating Provider/Extender: Ricard Dillon Weeks in Treatment: 1 Constitutional Patient is hypertensive.. Pulse regular and within target range for patient.Marland Kitchen Respirations regular, non-labored and within target range.. Temperature is normal and within the target range for the patient.Marland Kitchen appears in no distress. Respiratory Respiratory effort is easy and symmetric bilaterally. Rate is normal at rest and on room  air.. Cardiovascular  Heart rhythm and rate regular, without murmur or gallop.. Gastrointestinal (GI) large lower abdominal hernia however there is no tenderness. No liver or spleen enlargement or tenderness.. Genitourinary (GU) bladder is not distended. Lymphatic is no lymphadenopathy in the inguinal or axillary areas. Integumentary (Hair, Skin) the areas of fungal intertrigo already are somewhat better. There is less inflammation under the right breast lower abdominal pannus folds, inguinal area.Marland Kitchen Psychiatric is significant cognitive impairment. Notes wound exam oshe has superficial pressure ulcers stage II on the upper gluteus mirror image stage II wounds, the left ischial tuberosity and a deeper stage III wound through the muscle, right lower gluteal in the gluteal fold a superficial stage II. oThe area on the left posterior labia is somewhat concerning to me although it's possible this is also her pressure area I'm trying to avoid considering a biopsy. Electronic Signature(s) Signed: 12/22/2017 4:49:36 PM By: Linton Ham MD Entered By: Linton Ham on 12/22/2017 16:21:07 [name], [name] ([id number]) -------------------------------------------------------------------------------- Physician Orders Details Patient Name: [name], [name]. Date of Service: 12/22/2017 1:30 PM Medical Record Number: [id number] Patient Account Number: [id number] Date of Birth/Sex: 1928/12/23 (84 y.o. F) Treating RN: Cornell Barman Primary Care Provider: Ramonita Lab Other Clinician: Referring Provider: Ramonita Lab Treating Provider/Extender: Tito Dine in Treatment: 1 Verbal / Phone Orders: No Diagnosis Coding Wound Cleansing Wound #1 Right Gluteus o Clean wound with Normal Saline. Wound #2 Left Gluteus o Clean wound with Normal Saline. Wound #3 Left Gluteal fold o Clean wound with Normal Saline. Wound #4 Left,Posterior Upper Leg o Clean wound with Normal Saline. Wound #5 Left  Labia o Clean wound with Normal Saline. Wound #6 Right Gluteal fold o Clean wound with Normal Saline. Wound #7 Right Upper Leg o Clean wound with Normal Saline. Anesthetic (add to Medication List) Wound #1 Right Gluteus o Topical Lidocaine 4% cream applied to wound bed prior to debridement (In Clinic Only). Wound #2 Left Gluteus o Topical Lidocaine 4% cream applied to wound bed prior to debridement (In Clinic Only). Wound #3 Left Gluteal fold o Topical Lidocaine 4% cream applied to wound bed prior to debridement (In Clinic Only). Wound #4 Left,Posterior Upper Leg o Topical Lidocaine 4% cream applied to wound bed prior to debridement (In Clinic Only). Wound #5 Left Labia o Topical Lidocaine 4% cream applied to wound bed prior to debridement (In Clinic Only). Wound #6 Right Gluteal fold o Topical Lidocaine 4% cream applied to wound bed prior to debridement (In Clinic Only). Wound #7 Right Upper Leg o Topical Lidocaine 4% cream applied to wound bed prior to debridement (In Clinic Only). Primary Wound Dressing Skillman, Kimiyah [name]. ([id number]) Wound #1 Right Gluteus o Silver Alginate Wound #2 Left Gluteus o Silver Alginate Wound #3 Left Gluteal fold o Silver Alginate Wound #4 Left,Posterior Upper Leg o Silver Alginate Wound #5 Left Labia o Silver Alginate Wound #6 Right Gluteal fold o Silver Alginate Wound #7 Right Upper Leg o Silver Alginate Secondary Dressing Wound #1 Right Gluteus o Boardered Foam Dressing Wound #2 Left Gluteus o Boardered Foam Dressing Wound #3 Left Gluteal fold o Boardered Foam Dressing Wound #4 Left,Posterior Upper Leg o Boardered Foam Dressing Wound #5 Left Labia o Boardered Foam Dressing Wound #6 Right Gluteal fold o Boardered Foam Dressing Wound #7 Right Upper Leg o Boardered Foam Dressing Dressing Change Frequency Wound #1 Right Gluteus o Change Dressing Monday, Wednesday, Friday - HHRN please  leave dressing supplies in case a change needs to be made in between [facility] visits.  Wound #2 Left Gluteus o Change Dressing Monday, Wednesday, Friday [facility] please leave dressing supplies in case a change needs to be made in between [facility] visits. Wound #3 Left Gluteal fold o Change Dressing Monday, Wednesday, Friday [facility] please leave dressing supplies in case a change needs to be made in between [facility] visits. Wound #4 Left,Posterior Upper Leg Amendola, Jenilyn [name]. ([id number]) o Change Dressing Monday, Wednesday, Friday [facility] please leave dressing supplies in case a change needs to be made in between [facility] visits. Wound #5 Left Labia o Change Dressing Monday, Wednesday, Friday [facility] please leave dressing supplies in case a change needs to be made in between [facility] visits. Wound #6 Right Gluteal fold o Change Dressing Monday, Wednesday, Friday [facility] please leave dressing supplies in case a change needs to be made in between [facility] visits. Wound #7 Right Upper Leg o Change Dressing Monday, Wednesday, Friday [facility] please leave dressing supplies in case a change needs to be made in between [facility] visits. Follow-up Appointments Wound #1 Right Gluteus o Return Appointment in 2 weeks. Wound #2 Left Gluteus o Return Appointment in 2 weeks. Wound #3 Left Gluteal fold o Return Appointment in 2 weeks. Wound #4 Left,Posterior Upper Leg o Return Appointment in 2 weeks. Wound #5 Left Labia o Return Appointment in 2 weeks. Wound #6 Right Gluteal fold o Return Appointment in 2 weeks. Wound #7 Right Upper Leg o Return Appointment in 2 weeks. Off-Loading Wound #1 Right Gluteus o Turn and reposition every 2 hours Wound #2 Left Gluteus o Turn and reposition every 2 hours Wound #3 Left Gluteal fold o Turn and reposition every 2 hours Wound #4 Left,Posterior Upper Leg o Turn and reposition every 2 hours Wound #5 Left Labia o Turn and reposition every 2 hours Wound #6  Right Gluteal fold o Turn and reposition every 2 hours Wound #7 Right Upper Leg Aspinwall, Rayli [name]. ([id number]) o Turn and reposition every 2 hours Additional Orders / Instructions Wound #1 Right Gluteus o Increase protein intake. Wound #2 Left Gluteus o Increase protein intake. Wound #3 Left Gluteal fold o Increase protein intake. Wound #4 Left,Posterior Upper Leg o Increase protein intake. Wound #5 Left Labia o Increase protein intake. Wound #6 Right Gluteal fold o Increase protein intake. Wound #7 Right Upper Leg o Increase protein intake. Home Health Wound #1 Right [place] Visits - [place] Nurse may visit PRN to address patientos wound care needs. o FACE TO FACE ENCOUNTER: MEDICARE and MEDICAID PATIENTS: I certify that this patient is under my care and that I had a face-to-face encounter that meets the physician face-to-face encounter requirements with this patient on this date. The encounter with the patient was in whole or in part for the following MEDICAL CONDITION: (primary reason for [place]) MEDICAL NECESSITY: I certify, that based on my findings, NURSING services are a medically necessary home health service. HOME BOUND STATUS: I certify that my clinical findings support that this patient is homebound (i.e., Due to illness or injury, pt requires aid of supportive devices such as crutches, cane, wheelchairs, walkers, the use of special transportation or the assistance of another person to leave their place of residence. There is a normal inability to leave the home and doing so requires considerable and taxing effort. Other absences are for medical reasons / religious services and are infrequent or of short duration when for other reasons). o If current dressing causes regression in  wound condition, may D/C ordered dressing product/s and apply Normal Saline Moist Dressing daily until next [place] / Other MD appointment. [place] of regression in wound condition at [phone]. o Please direct any NON-WOUND related issues/requests for orders to patient's Primary Care Physician Wound #2 Left [place] Visits - [place] Nurse may visit PRN to address patientos wound care needs. o FACE TO FACE ENCOUNTER: MEDICARE and MEDICAID PATIENTS: I certify that this patient is under my care and that I had a face-to-face encounter that meets the physician face-to-face encounter requirements with this patient on this date. The encounter with the patient was in whole or in part for the following MEDICAL CONDITION: (primary reason for [place]) MEDICAL NECESSITY: I certify, that based on my findings, NURSING services are a medically necessary home health service. HOME BOUND STATUS: I certify that my clinical findings support that this patient is homebound (i.e., Due to illness or injury, pt requires aid of supportive devices such as crutches, cane, wheelchairs, walkers, the use of special transportation or the assistance of another person to leave their place of residence. There is a normal inability to leave the home and doing so requires considerable and taxing effort. Other absences are for medical reasons / religious services and are infrequent or of short duration when for other reasons). [name], [name]. ([id number]) o If current dressing causes regression in wound condition, may D/C ordered dressing product/s and apply Normal Saline Moist Dressing daily until next [place] / Other MD appointment. [place] of regression in wound condition at [phone]. o Please direct any NON-WOUND related issues/requests for orders to patient's Primary Care Physician Wound #3 Left Gluteal fold o [place] Visits - [place] Nurse may visit PRN to address patientos wound care  needs. o FACE TO FACE ENCOUNTER: MEDICARE and MEDICAID PATIENTS: I certify that this patient is under my care and that I had a face-to-face encounter that meets the physician face-to-face encounter requirements with this patient on this date. The encounter with the patient was in whole or in part for the following MEDICAL CONDITION: (primary reason for [place]) MEDICAL NECESSITY: I certify, that based on my findings, NURSING services are a medically necessary home health service. HOME BOUND STATUS: I certify that my clinical findings support that this patient is homebound (i.e., Due to illness or injury, pt requires aid of supportive devices such as crutches, cane, wheelchairs, walkers, the use of special transportation or the assistance of another person to leave their place of residence. There is a normal inability to leave the home and doing so requires considerable and taxing effort. Other absences are for medical reasons / religious services and are infrequent or of short duration when for other reasons). o If current dressing causes regression in wound condition, may D/C ordered dressing product/s and apply Normal Saline Moist Dressing daily until next [place] / Other MD appointment. [place] of regression in wound condition at [phone]. o Please direct any NON-WOUND related issues/requests for orders to patient's Primary Care Physician Wound #4 Left,Posterior Upper Leg o [place] Visits - [place] Nurse may visit PRN to address patientos wound care needs. o FACE TO FACE ENCOUNTER: MEDICARE and MEDICAID PATIENTS: I certify that this patient is under my care and that I had a face-to-face encounter that meets the physician face-to-face encounter requirements with this  patient on this date. The encounter with the patient was in whole or in part for the following MEDICAL CONDITION: (primary reason for [place]) MEDICAL NECESSITY: I certify, that based on my findings, NURSING services are a medically necessary home health service. HOME BOUND STATUS: I certify that my clinical findings support that this patient is homebound (i.e., Due to illness or injury, pt requires aid of supportive devices such as crutches, cane, wheelchairs, walkers, the use of special transportation or the assistance of another person to leave their place of residence. There is a normal inability to leave the home and doing so requires considerable and taxing effort. Other absences are for medical reasons / religious services and are infrequent or of short duration when for other reasons). o If current dressing causes regression in wound condition, may D/C ordered dressing product/s and apply Normal Saline Moist Dressing daily until next [place] / Other MD appointment. [place] of regression in wound condition at [phone]. o Please direct any NON-WOUND related issues/requests for orders to patient's Primary Care Physician Wound #5 Left [place] Visits - [place] Nurse may visit PRN to address patientos wound care needs. o FACE TO FACE ENCOUNTER: MEDICARE and MEDICAID PATIENTS: I certify that this patient is under my care and that I had a face-to-face encounter that meets the physician face-to-face encounter requirements with this patient on this date. The encounter with the patient was in whole or in part for the following MEDICAL CONDITION: (primary reason for [place]) MEDICAL NECESSITY: I certify, that based on my findings, NURSING services are a medically necessary home health service. HOME BOUND STATUS: I certify that my clinical findings support that this patient is homebound (i.e., Due to illness or injury, pt requires aid of supportive devices such as crutches, cane, wheelchairs, walkers, the use of special transportation or  the assistance of another person to leave their place of residence. There is a normal inability to leave the home and doing so requires considerable and taxing effort. Other absences are for medical reasons / religious services and are infrequent or of short duration when for other reasons). o If current dressing causes regression in wound condition, may D/C ordered dressing product/s and apply Normal Saline Moist Dressing daily until next [place] / Other MD appointment. [place] of regression in wound condition at [phone]. o Please direct any NON-WOUND related issues/requests for orders to patient's Primary Care Physician [name], [name] ([id number]) Wound #6 Right Gluteal fold o [place] Visits - [place] Nurse may visit PRN to address patientos wound care needs. o FACE TO FACE ENCOUNTER: MEDICARE and MEDICAID PATIENTS: I certify that this patient is under my care and that I had a face-to-face encounter that meets the physician face-to-face encounter requirements with this patient on this date. The encounter with the patient was in whole or in part for the following MEDICAL CONDITION: (primary reason for [place]) MEDICAL NECESSITY: I certify, that based on my findings, NURSING services are a medically necessary home health service. HOME BOUND STATUS: I certify that my clinical findings support that this patient is homebound (i.e., Due to illness or injury, pt requires aid of supportive devices such as crutches, cane, wheelchairs, walkers, the use of special transportation or the assistance of another person to leave their place of residence. There is a normal inability to leave the home and doing so requires considerable  and taxing effort. Other absences are for medical reasons / religious services and are infrequent or of short duration when for other reasons). o If current dressing causes regression in wound  condition, may D/C ordered dressing product/s and apply Normal Saline Moist Dressing daily until next [place] / Other MD appointment. [place] of regression in wound condition at [phone]. o Please direct any NON-WOUND related issues/requests for orders to patient's Primary Care Physician Wound #7 Right Upper Leg o [place] Visits - [place] Nurse may visit PRN to address patientos wound care needs. o FACE TO FACE ENCOUNTER: MEDICARE and MEDICAID PATIENTS: I certify that this patient is under my care and that I had a face-to-face encounter that meets the physician face-to-face encounter requirements with this patient on this date. The encounter with the patient was in whole or in part for the following MEDICAL CONDITION: (primary reason for [place]) MEDICAL NECESSITY: I certify, that based on my findings, NURSING services are a medically necessary home health service. HOME BOUND STATUS: I certify that my clinical findings support that this patient is homebound (i.e., Due to illness or injury, pt requires aid of supportive devices such as crutches, cane, wheelchairs, walkers, the use of special transportation or the assistance of another person to leave their place of residence. There is a normal inability to leave the home and doing so requires considerable and taxing effort. Other absences are for medical reasons / religious services and are infrequent or of short duration when for other reasons). o If current dressing causes regression in wound condition, may D/C ordered dressing product/s and apply Normal Saline Moist Dressing daily until next [place] / Other MD appointment. [place] of regression in wound condition at [phone]. o Please direct any NON-WOUND related issues/requests for orders to patient's Primary Care Physician Medications-please add to medication list. o  Other: - Ketoconazole cream to reddened areas o Other: - Continue Diflucan for rash Patient Medications Allergies: amoxicillin Notifications Medication Indication Start End ketoconazole 12/22/2017 DOSE topical 2 % cream - cream topical daily to affected area Electronic Signature(s) Signed: 12/22/2017 4:39:24 PM By: Linton Ham MD Entered By: Linton Ham on 12/22/2017 16:39:23 [name], [name]. ([id number]) -------------------------------------------------------------------------------- Problem List Details Patient Name: Warning, Joslynn [name]. Date of Service: 12/22/2017 1:30 PM Medical Record Number: [id number] Patient Account Number: [id number] Date of Birth/Sex: 07-04-1929 (84 y.o. 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 ([id number]) -------------------------------------------------------------------------------- Progress Note Details Patient Name: [name], [name]. Date of Service: 12/22/2017 1:30 PM Medical Record Number: [id number] Patient  Account Number: [id number] Date of Birth/Sex: 19-Nov-1928 (84 y.o. F) Treating RN: Cornell Barman Primary Care  Provider: Ramonita Lab Other Clinician: Referring Provider: Ramonita Lab Treating Provider/Extender: Tito Dine in Treatment: 1 Subjective History of Present Illness (HPI) ADMISSION 12/15/17; this is an 84 year old woman who lives at home with her husband. She is referred here from her dermatologist Dr. Sarina Ser for review of pressure areas on her bilateral buttock's gluteal folds left ischial tuberosity and labia. She also has absolutely horrible tinea/intertrigo involving her lower abdomen, thighs, peri-area. This is also present in her right inframammary area. The patient is immobilized and her husband's description this is actually been getting worse. 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 She is mostly confined to wheelchair at home and sleeps in a recliner at night and has done this for many years. Not able to get a history of urinary incontinence. Currently they are using cortisone and a and D cream to the wounds and he apparently has ketoconazole to the large areas of intertrigo. She was hospitalized in April with increased knee pain as well as scrotal irritation from tinea/[place] infection. 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 I'm not sure exactly when this was started over this is a predictable interaction which almost precludes joint use of these treatments. She has 6 pressure ulcers on the right and left gluteal folds, upper right and left buttock. Left labia. A stage III wound over the left ischial tuberosity. She also has an area on the right interior upper thigh just low the gluteal fold. And an area on the right anterior thigh. She has absolutely terrible intertrigo which could be candidal or could be  tineal. She also probably has candidal dermatitis under the right greater than left breast. She is in a lot of discomfort with movement. Her husband tells me he is exhausted with the care needs of his wife currently. 12/22/17; since the patient was last here she is only had 2 of the daily doses of Diflucan 100 mg. I did spoken to her primary physician Dr. Caryl Comes he was not comfortable with stopping the Coumadin and therefore substituted Xarelto. This will allow daily dose of Diflucan for 2 weeks. She may require more than this. He is also using topical ketoconazole which I'll prescribe today The patient also has pressure ulcerations on her bilateral buttocks, right ischial tuberosity, left gluteal fold. There is also an ulceration on the posterior part of her labia which is concerning. Objective Constitutional Patient is hypertensive.. Pulse regular and within target range for patient.Marland Kitchen Respirations regular, non-labored and within target range.. Temperature is normal and within the target range for the patient.Marland Kitchen appears in no distress. Vitals Time Taken: 1:37 PM, Height: 63 in, Weight: 246 lbs, BMI: 43.6, Temperature: 97.9 F, Pulse: 82 bpm, Respiratory Rate: 18 breaths/min, Blood Pressure: 147/68 mmHg. Respiratory Respiratory effort is easy and symmetric bilaterally. Rate is normal at rest and on room air.Geoffry Paradise, Khadeeja LMarland Kitchen ([id number]) Cardiovascular Heart rhythm and rate regular, without murmur or gallop.. Gastrointestinal (GI) large lower abdominal hernia however there is no tenderness. No liver or spleen enlargement or tenderness.. Genitourinary (GU) bladder is not distended. Lymphatic is no lymphadenopathy in the inguinal or axillary areas. Psychiatric is significant cognitive impairment. General Notes: wound exam she has superficial pressure ulcers stage II on the upper gluteus mirror image stage II wounds, the left ischial tuberosity and a deeper stage III wound through the muscle,  right lower gluteal in  the gluteal fold a superficial stage II. The area on the left posterior labia is somewhat concerning to me although it's possible this is also her pressure area I'm trying to avoid considering a biopsy. Integumentary (Hair, Skin) the areas of fungal intertrigo already are somewhat better. There is less inflammation under the right breast lower abdominal pannus folds, inguinal area.. Wound #1 status is Open. Original cause of wound was Pressure Injury. The wound is located on the Right Gluteus. The wound measures 1.7cm length x 0.9cm width x 0.2cm depth; 1.202cm^2 area and 0.24cm^3 volume. There is no tunneling or undermining noted. There is a large amount of serous drainage noted. The wound margin is distinct with the outline attached to the wound base. There is large (67-100%) red granulation within the wound bed. There is a small (1-33%) amount of necrotic tissue within the wound bed including Adherent Slough. The periwound skin appearance exhibited: Ecchymosis. The periwound skin appearance did not exhibit: Erythema. Periwound temperature was noted as No Abnormality. The periwound has tenderness on palpation. Wound #2 status is Open. Original cause of wound was Pressure Injury. The wound is located on the Left Gluteus. The wound measures 0.9cm length x 0.4cm width x 0.1cm depth; 0.283cm^2 area and 0.028cm^3 volume. There is no tunneling or undermining noted. There is a large amount of serous drainage noted. The wound margin is distinct with the outline attached to the wound base. There is large (67-100%) red granulation within the wound bed. There is a small (1-33%) amount of necrotic tissue within the wound bed including Adherent Slough. The periwound skin appearance exhibited: Ecchymosis. Periwound temperature was noted as No Abnormality. The periwound has tenderness on palpation. Wound #3 status is Open. Original cause of wound was Pressure Injury. The wound is located  on the Left Gluteal fold. The wound measures 1.9cm length x 0.6cm width x 0.1cm depth; 0.895cm^2 area and 0.09cm^3 volume. There is Fat Layer (Subcutaneous Tissue) Exposed exposed. There is no tunneling or undermining noted. There is a large amount of serous drainage noted. The wound margin is distinct with the outline attached to the wound base. There is medium (34-66%) red granulation within the wound bed. There is a medium (34-66%) amount of necrotic tissue within the wound bed including Adherent Slough. The periwound skin appearance exhibited: Ecchymosis. Periwound temperature was noted as No Abnormality. The periwound has tenderness on palpation. Wound #4 status is Open. Original cause of wound was Pressure Injury. The wound is located on the Left,Posterior Upper Leg. The wound measures 0.2cm length x 0.5cm width x 0.1cm depth; 0.079cm^2 area and 0.008cm^3 volume. There is no tunneling or undermining noted. There is a medium amount of serous drainage noted. The wound margin is distinct with the outline attached to the wound base. There is large (67-100%) red granulation within the wound bed. There is a small (1-33%) amount of necrotic tissue within the wound bed including Adherent Slough. The periwound skin appearance exhibited: Ecchymosis. Periwound temperature was noted as No Abnormality. The periwound has tenderness on palpation. Wound #5 status is Open. Original cause of wound was Pressure Injury. The wound is located on the Left Labia. The wound measures 1.6cm length x 0.7cm width x 0.1cm depth; 0.88cm^2 area and 0.088cm^3 volume. There is no tunneling or undermining noted. There is a large amount of serous drainage noted. The wound margin is distinct with the outline attached to the wound base. There is large (67-100%) red granulation within the wound bed. There is a small (1-33%)  amount of [name], [name]. ([id number]) necrotic tissue within the wound bed including Adherent Slough. The  periwound skin appearance exhibited: Ecchymosis. Periwound temperature was noted as No Abnormality. The periwound has tenderness on palpation. Wound #6 status is Open. Original cause of wound was Pressure Injury. The wound is located on the Right Gluteal fold. The wound measures 2.3cm length x 1.9cm width x 0.8cm depth; 3.432cm^2 area and 2.746cm^3 volume. There is no tunneling or undermining noted. There is a large amount of serous drainage noted. The wound margin is distinct with the outline attached to the wound base. There is large (67-100%) red granulation within the wound bed. There is a small (1-33%) amount of necrotic tissue within the wound bed including Adherent Slough. The periwound skin appearance exhibited: Ecchymosis. Periwound temperature was noted as No Abnormality. The periwound has tenderness on palpation. Wound #7 status is Open. Original cause of wound was Gradually Appeared. The wound is located on the Right Upper Leg. The wound measures 2cm length x 1.8cm width x 0.1cm depth; 2.827cm^2 area and 0.283cm^3 volume. There is Fat Layer (Subcutaneous Tissue) Exposed exposed. There is no tunneling or undermining noted. There is a large amount of serous drainage noted. The wound margin is flat and intact. There is large (67-100%) red, hyper - granulation within the wound bed. There is a small (1-33%) amount of necrotic tissue within the wound bed including Adherent Slough. The periwound skin appearance exhibited: Excoriation, Rash. The periwound skin appearance did not exhibit: Callus, Crepitus, Induration, Scarring, Dry/Scaly, Maceration, Atrophie Blanche, Cyanosis, Ecchymosis, Hemosiderin Staining, Mottled, Pallor, Rubor, Erythema. Periwound temperature was noted as No Abnormality. The periwound has tenderness on palpation. 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 Wound #2 Left Gluteus: Clean wound with Normal Saline. Wound #3 Left Gluteal fold: Clean wound with Normal Saline. Wound #4 Left,Posterior Upper Leg: Clean wound with Normal Saline. Wound #5 Left Labia: Clean wound with Normal Saline. Wound #6 Right Gluteal fold: Clean wound with Normal Saline. Wound #7 Right Upper Leg: Clean wound with Normal Saline. Anesthetic (add to Medication List): [name], [name]. ([id number]) Wound #1 Right Gluteus: Topical Lidocaine 4% cream applied to wound bed prior to debridement (In Clinic Only). Wound #2 Left Gluteus: Topical Lidocaine 4% cream applied to wound bed prior to debridement (In Clinic Only). Wound #3 Left Gluteal fold: Topical Lidocaine 4% cream applied to wound bed prior to debridement (In Clinic Only). Wound #4 Left,Posterior Upper Leg: Topical Lidocaine 4% cream applied to wound bed prior to debridement (In Clinic Only). Wound #5 Left Labia: Topical Lidocaine 4% cream applied to wound bed prior to debridement (In Clinic Only). Wound #6 Right Gluteal fold: Topical Lidocaine 4% cream applied to wound bed prior to debridement (In Clinic Only). Wound #7 Right Upper Leg: Topical Lidocaine 4% cream applied to wound bed prior to debridement (In Clinic Only). Primary Wound Dressing: Wound #1 Right Gluteus: Silver Alginate Wound #2 Left Gluteus: Silver Alginate Wound #3 Left Gluteal fold: Silver Alginate Wound #4 Left,Posterior Upper Leg: Silver Alginate Wound #5 Left Labia: Silver Alginate Wound #6 Right Gluteal fold: Silver Alginate Wound #7 Right  Upper Leg: Silver Alginate Secondary Dressing: Wound #1 Right  Gluteus: Boardered Foam Dressing Wound #2 Left Gluteus: Boardered Foam Dressing Wound #3 Left Gluteal fold: Boardered Foam Dressing Wound #4 Left,Posterior Upper Leg: Boardered Foam Dressing Wound #5 Left Labia: Boardered Foam Dressing Wound #6 Right Gluteal fold: Boardered Foam Dressing Wound #7 Right Upper Leg: Boardered Foam Dressing Dressing Change Frequency: Wound #1 Right Gluteus: Change Dressing Monday, Wednesday, Friday - [facility] please leave dressing supplies in case a change needs to be made in between [facility] visits. Wound #2 Left Gluteus: Change Dressing Monday, Wednesday, Friday [facility] please leave dressing supplies in case a change needs to be made in between [facility] visits. Wound #3 Left Gluteal fold: Change Dressing Monday, Wednesday, Friday [facility] please leave dressing supplies in case a change needs to be made in between [facility] visits. Wound #4 Left,Posterior Upper Leg: Change Dressing Monday, Wednesday, Friday [facility] please leave dressing supplies in case a change needs to be made in between [facility] visits. Wound #5 Left Labia: Change Dressing Monday, Wednesday, Friday [facility] please leave dressing supplies in case a change needs to be made in between [facility] visits. NALEA, SALCE ([id number]) Wound #6 Right Gluteal fold: Change Dressing Monday, Wednesday, Friday [facility] please leave dressing supplies in case a change needs to be made in between [facility] visits. Wound #7 Right Upper Leg: Change Dressing Monday, Wednesday, Friday [facility] please leave dressing supplies in case a change needs to be made in between [facility] visits. Follow-up Appointments: Wound #1 Right Gluteus: Return Appointment in 2 weeks. Wound #2 Left Gluteus: Return Appointment in 2 weeks. Wound #3 Left Gluteal fold: Return Appointment in 2 weeks. Wound #4 Left,Posterior Upper Leg: Return Appointment in 2 weeks. Wound #5 Left Labia: Return Appointment in 2 weeks. Wound #6 Right Gluteal fold: Return  Appointment in 2 weeks. Wound #7 Right Upper Leg: Return Appointment in 2 weeks. Off-Loading: Wound #1 Right Gluteus: Turn and reposition every 2 hours Wound #2 Left Gluteus: Turn and reposition every 2 hours Wound #3 Left Gluteal fold: Turn and reposition every 2 hours Wound #4 Left,Posterior Upper Leg: Turn and reposition every 2 hours Wound #5 Left Labia: Turn and reposition every 2 hours Wound #6 Right Gluteal fold: Turn and reposition every 2 hours Wound #7 Right Upper Leg: Turn and reposition every 2 hours Additional Orders / Instructions: Wound #1 Right Gluteus: Increase protein intake. Wound #2 Left Gluteus: Increase protein intake. Wound #3 Left Gluteal fold: Increase protein intake. Wound #4 Left,Posterior Upper Leg: Increase protein intake. Wound #5 Left Labia: Increase protein intake. Wound #6 Right Gluteal fold: Increase protein intake. Wound #7 Right Upper Leg: Increase protein intake. Home Health: Wound #1 Right Gluteus: Continue Home Health Visits - [place] Nurse may visit PRN to address patient s wound care needs. FACE TO FACE ENCOUNTER: MEDICARE and MEDICAID PATIENTS: I certify that this patient is under my care and that I had a face-to-face encounter that meets the physician face-to-face encounter requirements with this patient on this date. The encounter with the patient was in whole or in part for the following MEDICAL CONDITION: (primary reason for [place]) MEDICAL NECESSITY: I certify, that based on my findings, NURSING services are a medically necessary home health service. HOME BOUND STATUS: I certify that my clinical findings support that this patient is homebound (i.e., Due to [place], Jyllian [name]. ([id number]) illness or injury, pt requires aid of supportive devices such as crutches, cane, wheelchairs, walkers,  the use of special transportation or the assistance of another person to leave their place of residence. There is a normal  inability to leave the home and doing so requires considerable and taxing effort. Other absences are for medical reasons / religious services and are infrequent or of short duration when for other reasons). If current dressing causes regression in wound condition, may D/C ordered dressing product/s and apply Normal Saline Moist Dressing daily until next [place] / Other MD appointment. [place] of regression in wound condition at [phone]. Please direct any NON-WOUND related issues/requests for orders to patient's Primary Care Physician Wound #2 Left Gluteus: [place] Visits - [place] Nurse may visit PRN to address patient s wound care needs. FACE TO FACE ENCOUNTER: MEDICARE and MEDICAID PATIENTS: I certify that this patient is under my care and that I had a face-to-face encounter that meets the physician face-to-face encounter requirements with this patient on this date. The encounter with the patient was in whole or in part for the following MEDICAL CONDITION: (primary reason for [place]) MEDICAL NECESSITY: I certify, that based on my findings, NURSING services are a medically necessary home health service. HOME BOUND STATUS: I certify that my clinical findings support that this patient is homebound (i.e., Due to illness or injury, pt requires aid of supportive devices such as crutches, cane, wheelchairs, walkers, the use of special transportation or the assistance of another person to leave their place of residence. There is a normal inability to leave the home and doing so requires considerable and taxing effort. Other absences are for medical reasons / religious services and are infrequent or of short duration when for other reasons). If current dressing causes regression in wound condition, may D/C ordered dressing product/s and apply Normal Saline Moist Dressing daily until next [place] / Other MD appointment.  [place] of regression in wound condition at [phone]. Please direct any NON-WOUND related issues/requests for orders to patient's Primary Care Physician Wound #3 Left Gluteal fold: [place] Visits - [place] Nurse may visit PRN to address patient s wound care needs. FACE TO FACE ENCOUNTER: MEDICARE and MEDICAID PATIENTS: I certify that this patient is under my care and that I had a face-to-face encounter that meets the physician face-to-face encounter requirements with this patient on this date. The encounter with the patient was in whole or in part for the following MEDICAL CONDITION: (primary reason for [place]) MEDICAL NECESSITY: I certify, that based on my findings, NURSING services are a medically necessary home health service. HOME BOUND STATUS: I certify that my clinical findings support that this patient is homebound (i.e., Due to illness or injury, pt requires aid of supportive devices such as crutches, cane, wheelchairs, walkers, the use of special transportation or the assistance of another person to leave their place of residence. There is a normal inability to leave the home and doing so requires considerable and taxing effort. Other absences are for medical reasons / religious services and are infrequent or of short duration when for other reasons). If current dressing causes regression in wound condition, may D/C ordered dressing product/s and apply Normal Saline Moist Dressing daily until next [place] / Other MD appointment. [place] of regression in wound condition at [phone]. Please direct any NON-WOUND related issues/requests for orders to patient's Primary Care Physician Wound #4 Left,Posterior Upper Leg: [place] Visits - [facility] Nurse may visit PRN to address patient s wound care needs. FACE TO FACE ENCOUNTER: MEDICARE and MEDICAID PATIENTS: I certify that  this patient is under my care and that I had a face-to-face encounter that meets the physician face-to-face encounter requirements with this patient on this date. The encounter with the patient was in whole or in part for the following MEDICAL CONDITION: (primary reason for [place]) MEDICAL NECESSITY: I certify, that based on my findings, NURSING services are a medically necessary home health service. HOME BOUND STATUS: I certify that my clinical findings support that this patient is homebound (i.e., Due to illness or injury, pt requires aid of supportive devices such as crutches, cane, wheelchairs, walkers, the use of special transportation or the assistance of another person to leave their place of residence. There is a normal inability to leave the home and doing so requires considerable and taxing effort. Other absences are for medical reasons / religious services and are infrequent or of short duration when for other reasons). If current dressing causes regression in wound condition, may D/C ordered dressing product/s and apply Normal Saline Moist Dressing daily until next [place] / Other MD appointment. [place] of regression in wound condition at [phone]. Please direct any NON-WOUND related issues/requests for orders to patient's Primary Care Physician Wound #5 Left Labia: [place] Visits - [place] Nurse may visit PRN to address patient s wound care needs. FACE TO FACE ENCOUNTER: MEDICARE and MEDICAID PATIENTS: I certify that this patient is under my care and that I [name], [name]. ([id number]) had a face-to-face encounter that meets the physician face-to-face encounter requirements with this patient on this date. The encounter with the patient was in whole or in part for the following MEDICAL CONDITION: (primary reason for [place]) MEDICAL NECESSITY: I certify, that based on my findings, NURSING services are a  medically necessary home health service. HOME BOUND STATUS: I certify that my clinical findings support that this patient is homebound (i.e., Due to illness or injury, pt requires aid of supportive devices such as crutches, cane, wheelchairs, walkers, the use of special transportation or the assistance of another person to leave their place of residence. There is a normal inability to leave the home and doing so requires considerable and taxing effort. Other absences are for medical reasons / religious services and are infrequent or of short duration when for other reasons). If current dressing causes regression in wound condition, may D/C ordered dressing product/s and apply Normal Saline Moist Dressing daily until next [place] / Other MD appointment. [place] of regression in wound condition at [phone]. Please direct any NON-WOUND related issues/requests for orders to patient's Primary Care Physician Wound #6 Right Gluteal fold: [place] Visits - [place] Nurse may visit PRN to address patient s wound care needs. FACE TO FACE ENCOUNTER: MEDICARE and MEDICAID PATIENTS: I certify that this patient is under my care and that I had a face-to-face encounter that meets the physician face-to-face encounter requirements with this patient on this date. The encounter with the patient was in whole or in part for the following MEDICAL CONDITION: (primary reason for [place]) MEDICAL NECESSITY: I certify, that based on my findings, NURSING services are a medically necessary home health service. HOME BOUND STATUS: I certify that my clinical findings support that this patient is homebound (i.e., Due to illness or injury, pt requires aid of supportive devices  such as crutches, cane, wheelchairs, walkers, the use of special transportation or the assistance of another person to leave their place of residence. There is a normal inability to leave  the home and doing so requires considerable and taxing effort. Other absences are for medical reasons / religious services and are infrequent or of short duration when for other reasons). If current dressing causes regression in wound condition, may D/C ordered dressing product/s and apply Normal Saline Moist Dressing daily until next [place] / Other MD appointment. [place] of regression in wound condition at [phone]. Please direct any NON-WOUND related issues/requests for orders to patient's Primary Care Physician Wound #7 Right Upper Leg: [place] Visits - [place] Nurse may visit PRN to address patient s wound care needs. FACE TO FACE ENCOUNTER: MEDICARE and MEDICAID PATIENTS: I certify that this patient is under my care and that I had a face-to-face encounter that meets the physician face-to-face encounter requirements with this patient on this date. The encounter with the patient was in whole or in part for the following MEDICAL CONDITION: (primary reason for [place]) MEDICAL NECESSITY: I certify, that based on my findings, NURSING services are a medically necessary home health service. HOME BOUND STATUS: I certify that my clinical findings support that this patient is homebound (i.e., Due to illness or injury, pt requires aid of supportive devices such as crutches, cane, wheelchairs, walkers, the use of special transportation or the assistance of another person to leave their place of residence. There is a normal inability to leave the home and doing so requires considerable and taxing effort. Other absences are for medical reasons / religious services and are infrequent or of short duration when for other reasons). If current dressing causes regression in wound condition, may D/C ordered dressing product/s and apply Normal Saline Moist Dressing daily until next [place] / Other MD appointment. [place] of regression in wound condition at [phone]. Please direct any NON-WOUND related issues/requests for orders to patient's Primary Care Physician Medications-please add to medication list.: Other: - Ketoconazole cream to reddened areas Other: - Continue Diflucan for rash The following medication(s) was prescribed: ketoconazole topical 2 % cream cream topical daily to affected area starting 12/22/2017 #1Diflucan 100 mg a day for 2 weeks was EI by myself 2 days ago. She is started this for the extensive tinea/fungal intertrigo. he is purchasing inter-dry which should help with the maceration #2 continuous silver alginate to the areas of pressure ulceration although it doesn't sound like they've been able to start this yet through skilled nursing Gadea, Alishea [name]. ([id number]) #3 significant cognitive impairment is present here. #4 large lower abdominal hernia however this reduces easily, no surgical consultation is currently indicated Electronic Signature(s) Signed: 12/22/2017 4:39:46 PM By: Linton Ham MD Entered By: Linton Ham on 12/22/2017 16:39:46 [place], Lucerito [name]. ([id number]) -------------------------------------------------------------------------------- SuperBill Details Patient Name: Benally, Arretta [name]. Date of Service: 12/22/2017 Medical Record Number: [id number] Patient Account Number: [id number] Date of Birth/Sex: 1928-09-03 (84 y.o. 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 L89.323 Pressure ulcer of left buttock, stage 3 L97.111 Non-pressure chronic ulcer of right thigh limited to breakdown  of skin B35.4  Tinea corporis S31.502D Unspecified open wound of unspecified external genital organs, female, subsequent encounter Facility Procedures CPT4 Code: [id number] Description: [phone] - WOUND CARE VISIT-LEV 5 EST PT Modifier: Quantity: 1 Physician Procedures CPT4: Description Modifier Quantity Code [id number] - WC PHYS LEVEL 3 - EST PT 1 ICD-10 Diagnosis Description L89.312 Pressure ulcer of right buttock, stage 2 L89.322 Pressure ulcer of left buttock, stage 2 B35.4 Tinea corporis S31.502D Unspecified  open wound of unspecified external genital organs, female, subsequent encounter Electronic Signature(s) Signed: 12/22/2017 4:49:36 PM By: Linton Ham MD Entered By: Linton Ham on 12/22/2017 16:23:25

## 2017-12-27 ENCOUNTER — Telehealth: Payer: Self-pay | Admitting: Urology

## 2017-12-27 NOTE — Telephone Encounter (Signed)
Patient's husband called and wanted to make sure we knew that she was taking Prilosec because the insert that came with the Ceftin we gave her stated to let her doctor know if she was taking this medication.   Sharyn Lull

## 2017-12-27 NOTE — Telephone Encounter (Signed)
Okay for pt to take?

## 2017-12-28 NOTE — Telephone Encounter (Signed)
Prilosec can reduce the absorption of cefuroxime and make it less effective.  It would be preferred if she could hold the Prilosec while taking the Ceftin.

## 2017-12-29 ENCOUNTER — Ambulatory Visit: Payer: Medicare Other | Admitting: Internal Medicine

## 2017-12-29 NOTE — Telephone Encounter (Signed)
lmom for pt

## 2017-12-30 NOTE — Telephone Encounter (Signed)
Pt husband informed

## 2018-01-05 ENCOUNTER — Encounter: Payer: Medicare Other | Admitting: Internal Medicine

## 2018-01-05 DIAGNOSIS — L89312 Pressure ulcer of right buttock, stage 2: Secondary | ICD-10-CM | POA: Diagnosis not present

## 2018-01-06 NOTE — Progress Notes (Addendum)
JABREE, PERNICE ([id number]) Visit Report for 01/05/2018 HPI Details Patient Name: [name], [name] Date of Service: 01/05/2018 3:00 PM Medical Record Number: [id number] Patient Account Number: [id number] Date of Birth/Sex: January 05, 1929 (84 y.o. F) Treating RN: Cornell Barman Primary Care Provider: Ramonita Lab Other Clinician: Referring Provider: Ramonita Lab Treating Provider/Extender: Tito Dine in Treatment: 3 History of Present Illness HPI Description: ADMISSION 12/15/17; this is an 84 year old woman who lives at home with her husband. She is referred here from her dermatologist Dr. Sarina Ser for review of pressure areas on her bilateral buttock's gluteal folds left ischial tuberosity and labia. She also has absolutely horrible tinea/intertrigo involving her lower abdomen, thighs, peri-area. This is also present in her right inframammary area. The patient is immobilized and her husband's description this is actually been getting worse. 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 She is mostly confined to wheelchair at home and sleeps in a recliner at night and has done this for many years. Not able to get a history of urinary incontinence. Currently they are using cortisone and a and D cream to the wounds and he apparently has ketoconazole to the large areas of intertrigo. She was hospitalized in April with increased knee pain as well as scrotal irritation from tinea/[place] infection. 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 I'm not sure exactly when this was started over this is a predictable interaction which almost precludes joint use of these treatments. She has 6 pressure ulcers on the right and left gluteal folds, upper right and left buttock. Left labia. A stage III wound over the left ischial tuberosity. She also has an area on the  right interior upper thigh just low the gluteal fold. And an area on the right anterior thigh. She has absolutely terrible intertrigo which could be candidal or could be tineal. She also probably has candidal dermatitis under the right greater than left breast. She is in a lot of discomfort with movement. Her husband tells me he is exhausted with the care needs of his wife currently. 12/22/17; since the patient was last here she is only had 2 of the daily doses of Diflucan 100 mg. I did spoken to her primary physician Dr. Caryl Comes he was not comfortable with stopping the Coumadin and therefore substituted Xarelto. This will allow daily dose of Diflucan for 2 weeks. She may require more than this. He is also using topical ketoconazole which I'll prescribe today The patient also has pressure ulcerations on her bilateral buttocks, right ischial tuberosity, left gluteal fold. There is also an ulceration on the posterior part of her labia which is concerning. 01/05/18; the patient is still using Diflucan 100 mg a day and ketoconazole daily. The widespread tinea rash is a lot better but still not resolved. With regards to wound; oThe area on the left upper buttock is resolved right upper buttock/mirror image wound is a lot better oRight ischial tuberosity unchanged in terms of dimensions although the wound surface looks better oPosterior part of the right labia unchanged. I find this area concerning, not a usual pressure area but certainly possible oLeft gluteal fold posteriorly a lot better using silver alginate oRight anterior thigh think this was part of the extensive fungal rash/dermatitis. Wound looks stable though I don't think this is changed a lot. We managed to get this patient on hospital bed but she will  get intermittent insisting on sleeping in a recliner. Her husband is up most night trying to get her to stand up area and I emphasized to her the need to get into bed at night and get used  to sleeping in a bed. I don't see an obvious physical reason for her to be sitting in a recliner. Electronic Signature(s) Signed: 01/05/2018 6:35:58 PM By: Linton Ham MD Hoey, Gennie Alma ([id number]) Entered By: Linton Ham on 01/05/2018 16:47:56 Morency, Nekita Carlean Jews ([id number]) -------------------------------------------------------------------------------- Physical Exam Details Patient Name: Awbrey, Pamlea L. Date of Service: 01/05/2018 3:00 PM Medical Record Number: [id number] Patient Account Number: [id number] Date of Birth/Sex: 1928-09-04 (84 y.o. F) Treating RN: Cornell Barman Primary Care Provider: Ramonita Lab Other Clinician: Referring Provider: Ramonita Lab Treating Provider/Extender: Tito Dine in Treatment: 3 Constitutional Patient is hypertensive.. Pulse regular and within target range for patient.Marland Kitchen Respirations regular, non-labored and within target range.. Temperature is normal and within the target range for the patient.Marland Kitchen appears in no distress. Eyes Conjunctivae clear. No discharge. Respiratory Respiratory effort is easy and symmetric bilaterally. Rate is normal at rest and on room air.. Cardiovascular Heart rhythm and rate regular, without murmur or gallop.appears well-hydrated. Gastrointestinal (GI) large midline lower abdominal hernia however this reduces and is nontender. Genitourinary (GU) bladder is not enlarged. Lymphatic none palpable in the popliteal or inguinal area. Integumentary (Hair, Skin) widespread tinea rash in the lower abdomen pannus upper thighs considerably better. Also rash under the breasts considerably better. Notes wound exam oThe superficial stage II on the left upper gluteus has closed. Right is smaller oRight ischial tuberosity area is about the same in terms of depth however the wound looks satisfactory. Beginning epithelialization. oLeft gluteal fold upper thigh also looks better. oLeft posterior labia is about the same. There  is no subcutaneous involvement oright anterior thigh about the same oFungal rash is considerably better/tinea corporis Electronic Signature(s) Signed: 01/05/2018 6:35:58 PM By: Linton Ham MD Entered By: Linton Ham on 01/05/2018 16:52:00 Chui, Karle Carlean Jews ([id number]) -------------------------------------------------------------------------------- Physician Orders Details Patient Name: Breck, Jernie L. Date of Service: 01/05/2018 3:00 PM Medical Record Number: [id number] Patient Account Number: [id number] Date of Birth/Sex: [birth year] (84 y.o. F) Treating RN: Cornell Barman Primary Care Provider: Ramonita Lab Other Clinician: Referring Provider: Ramonita Lab Treating Provider/Extender: Tito Dine in Treatment: 3 Verbal / Phone Orders: No Diagnosis Coding Wound Cleansing Wound #1 Right Gluteus o Clean wound with Normal Saline. Wound #2 Left Gluteus o Clean wound with Normal Saline. Wound #5 Left Labia o Clean wound with Normal Saline. Wound #6 Right Gluteal fold o Clean wound with Normal Saline. Wound #7 Right Upper Leg o Clean wound with Normal Saline. Anesthetic (add to Medication List) Wound #1 Right Gluteus o Topical Lidocaine 4% cream applied to wound bed prior to debridement (In Clinic Only). Wound #2 Left Gluteus o Topical Lidocaine 4% cream applied to wound bed prior to debridement (In Clinic Only). Wound #5 Left Labia o Topical Lidocaine 4% cream applied to wound bed prior to debridement (In Clinic Only). Wound #6 Right Gluteal fold o Topical Lidocaine 4% cream applied to wound bed prior to debridement (In Clinic Only). Wound #7 Right Upper Leg o Topical Lidocaine 4% cream applied to wound bed prior to debridement (In Clinic Only). Primary Wound Dressing Wound #1 Right Gluteus o Silver Alginate Wound #2 Left Gluteus o Silver Alginate Wound #5 Left Labia o Silver Alginate Wound #6 Right Gluteal fold o Silver Alginate Polansky,  Marguerette L. ([id number]) Wound #  7 Right Upper Leg o Silver Alginate Secondary Dressing Wound #1 Right Gluteus o Boardered Foam Dressing Wound #2 Left Gluteus o Boardered Foam Dressing Wound #5 Left Labia o Boardered Foam Dressing Wound #6 Right Gluteal fold o Boardered Foam Dressing Wound #7 Right Upper Leg o Boardered Foam Dressing Dressing Change Frequency Wound #1 Right Gluteus o Change Dressing Monday, Wednesday, Friday - [facility] please leave dressing supplies in case a change needs to be made in between [facility] visits. Wound #2 Left Gluteus o Change Dressing Monday, Wednesday, Friday [facility] please leave dressing supplies in case a change needs to be made in between [facility] visits. Wound #5 Left Labia o Change Dressing Monday, Wednesday, Friday [facility] please leave dressing supplies in case a change needs to be made in between [facility] visits. Wound #6 Right Gluteal fold o Change Dressing Monday, Wednesday, Friday [facility] please leave dressing supplies in case a change needs to be made in between [facility] visits. Wound #7 Right Upper Leg o Change Dressing Monday, Wednesday, Friday [facility] please leave dressing supplies in case a change needs to be made in between [facility] visits. Follow-up Appointments Wound #1 Right Gluteus o Return Appointment in 2 weeks. Wound #2 Left Gluteus o Return Appointment in 2 weeks. Wound #5 Left Labia o Return Appointment in 2 weeks. Wound #6 Right Gluteal fold o Return Appointment in 2 weeks. Wound #7 Right Upper Leg o Return Appointment in 2 weeks. Finkler, Kindel L. ([id number]) Off-Loading Wound #1 Right Gluteus o Roho cushion for wheelchair o Turn and reposition every 2 hours Wound #2 Left Gluteus o Roho cushion for wheelchair o Turn and reposition every 2 hours Wound #5 Left Labia o Roho cushion for wheelchair o Turn and reposition every 2 hours Wound #6 Right Gluteal fold o Roho cushion for wheelchair o Turn  and reposition every 2 hours Wound #7 Right Upper Leg o Roho cushion for wheelchair o Turn and reposition every 2 hours Additional Orders / Instructions Wound #1 Right Gluteus o Increase protein intake. Wound #2 Left Gluteus o Increase protein intake. Wound #5 Left Labia o Increase protein intake. Wound #6 Right Gluteal fold o Increase protein intake. Wound #7 Right Upper Leg o Increase protein intake. Home Health Wound #1 Right [place] Visits - [place] Nurse may visit PRN to address patientos wound care needs. o FACE TO FACE ENCOUNTER: MEDICARE and MEDICAID PATIENTS: I certify that this patient is under my care and that I had a face-to-face encounter that meets the physician face-to-face encounter requirements with this patient on this date. The encounter with the patient was in whole or in part for the following MEDICAL CONDITION: (primary reason for [place]) MEDICAL NECESSITY: I certify, that based on my findings, NURSING services are a medically necessary home health service. HOME BOUND STATUS: I certify that my clinical findings support that this patient is homebound (i.e., Due to illness or injury, pt requires aid of supportive devices such as crutches, cane, wheelchairs, walkers, the use of special transportation or the assistance of another person to leave their place of residence. There is a normal inability to leave the home and doing so requires considerable and taxing effort. Other absences are for medical reasons / religious services and are infrequent or of short duration when for other reasons). o If current dressing causes regression in wound condition, may D/C ordered dressing product/s and apply Normal Saline Moist Dressing daily until next [place] /  Other MD appointment. [place] of regression in wound condition at [phone]. o Please direct any NON-WOUND related  issues/requests for orders to patient's Primary Care Physician SHARNICE, BOSLER ([id number]) Wound #2 Left [place] Visits - [place] Nurse may visit PRN to address patientos wound care needs. o FACE TO FACE ENCOUNTER: MEDICARE and MEDICAID PATIENTS: I certify that this patient is under my care and that I had a face-to-face encounter that meets the physician face-to-face encounter requirements with this patient on this date. The encounter with the patient was in whole or in part for the following MEDICAL CONDITION: (primary reason for [place]) MEDICAL NECESSITY: I certify, that based on my findings, NURSING services are a medically necessary home health service. HOME BOUND STATUS: I certify that my clinical findings support that this patient is homebound (i.e., Due to illness or injury, pt requires aid of supportive devices such as crutches, cane, wheelchairs, walkers, the use of special transportation or the assistance of another person to leave their place of residence. There is a normal inability to leave the home and doing so requires considerable and taxing effort. Other absences are for medical reasons / religious services and are infrequent or of short duration when for other reasons). o If current dressing causes regression in wound condition, may D/C ordered dressing product/s and apply Normal Saline Moist Dressing daily until next [place] / Other MD appointment. [place] of regression in wound condition at [phone]. o Please direct any NON-WOUND related issues/requests for orders to patient's Primary Care Physician Wound #5 Left [place] Visits - [place] Nurse may visit PRN to address patientos wound care needs. o FACE TO FACE ENCOUNTER: MEDICARE and MEDICAID PATIENTS: I certify that this patient is under my care and that I had a face-to-face encounter that  meets the physician face-to-face encounter requirements with this patient on this date. The encounter with the patient was in whole or in part for the following MEDICAL CONDITION: (primary reason for [place]) MEDICAL NECESSITY: I certify, that based on my findings, NURSING services are a medically necessary home health service. HOME BOUND STATUS: I certify that my clinical findings support that this patient is homebound (i.e., Due to illness or injury, pt requires aid of supportive devices such as crutches, cane, wheelchairs, walkers, the use of special transportation or the assistance of another person to leave their place of residence. There is a normal inability to leave the home and doing so requires considerable and taxing effort. Other absences are for medical reasons / religious services and are infrequent or of short duration when for other reasons). o If current dressing causes regression in wound condition, may D/C ordered dressing product/s and apply Normal Saline Moist Dressing daily until next [place] / Other MD appointment. [place] of regression in wound condition at [phone]. o Please direct any NON-WOUND related issues/requests for orders to patient's Primary Care Physician Wound #6 Right Gluteal fold o [place] Visits - [place] Nurse may visit PRN to address patientos wound care needs. o FACE TO FACE ENCOUNTER: MEDICARE and MEDICAID PATIENTS: I certify that this patient is under my care and that I had a face-to-face encounter that meets the physician face-to-face encounter requirements with this patient on this date. The encounter with the patient was in whole or in part for the following MEDICAL CONDITION: (primary  reason for Home Healthcare) MEDICAL NECESSITY: I certify, that based on my findings, NURSING services are a medically necessary home health service. HOME BOUND STATUS: I certify that  my clinical findings support that this patient is homebound (i.e., Due to illness or injury, pt requires aid of supportive devices such as crutches, cane, wheelchairs, walkers, the use of special transportation or the assistance of another person to leave their place of residence. There is a normal inability to leave the home and doing so requires considerable and taxing effort. Other absences are for medical reasons / religious services and are infrequent or of short duration when for other reasons). o If current dressing causes regression in wound condition, may D/C ordered dressing product/s and apply Normal Saline Moist Dressing daily until next [place] / Other MD appointment. [place] of regression in wound condition at [phone]. o Please direct any NON-WOUND related issues/requests for orders to patient's Primary Care Physician Wound #7 Right Upper Leg o [place] Visits - [place] Nurse may visit PRN to address patientos wound care needs. LILLIEMAE, FRUGE ([id number]) o FACE TO FACE ENCOUNTER: MEDICARE and MEDICAID PATIENTS: I certify that this patient is under my care and that I had a face-to-face encounter that meets the physician face-to-face encounter requirements with this patient on this date. The encounter with the patient was in whole or in part for the following MEDICAL CONDITION: (primary reason for [place]) MEDICAL NECESSITY: I certify, that based on my findings, NURSING services are a medically necessary home health service. HOME BOUND STATUS: I certify that my clinical findings support that this patient is homebound (i.e., Due to illness or injury, pt requires aid of supportive devices such as crutches, cane, wheelchairs, walkers, the use of special transportation or the assistance of another person to leave their place of residence. There is a normal inability to leave the home and doing so requires  considerable and taxing effort. Other absences are for medical reasons / religious services and are infrequent or of short duration when for other reasons). o If current dressing causes regression in wound condition, may D/C ordered dressing product/s and apply Normal Saline Moist Dressing daily until next [place] / Other MD appointment. [place] of regression in wound condition at [phone]. o Please direct any NON-WOUND related issues/requests for orders to patient's Primary Care Physician Medications-please add to medication list. Wound #1 Right Gluteus o Other: - Ketoconazole cream to reddened areas o Other: - Continue Diflucan for rash Wound #2 Left Gluteus o Other: - Ketoconazole cream to reddened areas o Other: - Continue Diflucan for rash Wound #5 Left Labia o Other: - Ketoconazole cream to reddened areas o Other: - Continue Diflucan for rash Wound #6 Right Gluteal fold o Other: - Ketoconazole cream to reddened areas o Other: - Continue Diflucan for rash Wound #7 Right Upper Leg o Other: - Ketoconazole cream to reddened areas o Other: - Continue Diflucan for rash Patient Medications Allergies: amoxicillin Notifications Medication Indication Start End Diflucan 01/05/2018 DOSE oral 100 mg tablet - 1 tablet oral qd for a further 2 weeks ketoconazole 01/05/2018 DOSE topical 2 % cream - cream topical apply daily to affected areas continuing rx Electronic Signature(s) Signed: 01/05/2018 4:40:24 PM By: Linton Ham MD Entered By: Linton Ham on 01/05/2018 16:40:22 Standlee, Azlin L. ([id number]) -------------------------------------------------------------------------------- Problem List Details Patient Name: Stoklosa, Adra L. Date of Service: 01/05/2018 3:00 PM Medical Record Number: [id number] Patient  Account Number: [id number] Date of Birth/Sex: 1928-11-02 (84 y.o. F) Treating RN: Cornell Barman Primary Care Provider: Ramonita Lab Other Clinician: Referring Provider: Ramonita Lab Treating Provider/Extender: Tito Dine in Treatment: 3 Active Problems ICD-10 Evaluated Encounter Code Description Active Date Today Diagnosis L89.312 Pressure ulcer of right buttock, stage 2 12/15/2017 Yes Yes Status Complications Interventions Improving N/a continue silver Medical alginate and best Decision attempt at Making : aggressive offloading L89.322 Pressure ulcer of left buttock, stage 2 12/15/2017 Yes Yes Status Complications Interventions Medical Improving N/a resolved Decision Making : L97.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 ([id number]) B35.4 Tinea corporis 12/15/2017 Yes Yes Status Complications Interventions Improving dramatically improved. Widespread tinea infection in the ketoconazole 2% Medical wound areas could impede wound healing cream Oral Diflucan Decision 100 mg a day Making : continued for 2 weeks S31.502D Unspecified open wound of unspecified external genital 12/15/2017 Yes Yes organs, female, subsequent encounter Status Complications Interventions No N/a given the situation is change possible this is a pressure ulcer on Medical the posterior aspect Decision of her right labia. Making : Close monitoring for progression. This would dictate a biopsy Inactive Problems Resolved Problems Electronic Signature(s) Signed: 01/05/2018 6:35:58 PM By: Linton Ham MD Entered  By: Linton Ham on 01/05/2018 16:44:53 [place], Emylie L. ([id number]) -------------------------------------------------------------------------------- Progress Note Details Patient Name: Cuen, Latroya L. Date of Service: 01/05/2018 3:00 PM Medical Record Number: [id number] Patient Account Number: [id number] Date of Birth/Sex: 03/14/1929 (84 y.o. F) Treating RN: Cornell Barman Primary Care Provider: Ramonita Lab Other Clinician: Referring Provider: Ramonita Lab Treating Provider/Extender: Tito Dine in Treatment: 3 Subjective History of Present Illness (HPI) ADMISSION 12/15/17; this is an 84 year old woman who lives at home with her husband. She is referred here from her dermatologist Dr. Sarina Ser for review of pressure areas on her bilateral buttock's gluteal folds left ischial tuberosity and labia. She also has absolutely horrible tinea/intertrigo involving her lower abdomen, thighs, peri-area. This is also present in her right inframammary area. The patient is immobilized and her husband's description this is actually been getting worse. 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 She is mostly confined to wheelchair at home and sleeps in a recliner at night and has done this for many years. Not able to get a history of urinary incontinence. Currently they are using cortisone and a and D cream to the wounds and he apparently has ketoconazole to the large areas of intertrigo. She was hospitalized in April with increased knee pain as well as scrotal irritation from tinea/[place] infection. 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 I'm not sure exactly when this was started over this is a predictable interaction which almost precludes joint use of these treatments. She has 6 pressure ulcers on the right and left gluteal folds, upper right  and left buttock. Left labia. A stage III wound over the left ischial tuberosity. She also has an area on the right interior upper thigh  just low the gluteal fold. And an area on the right anterior thigh. She has absolutely terrible intertrigo which could be candidal or could be tineal. She also probably has candidal dermatitis under the right greater than left breast. She is in a lot of discomfort with movement. Her husband tells me he is exhausted with the care needs of his wife currently. 12/22/17; since the patient was last here she is only had 2 of the daily doses of Diflucan 100 mg. I did spoken to her primary physician Dr. Caryl Comes he was not comfortable with stopping the Coumadin and therefore substituted Xarelto. This will allow daily dose of Diflucan for 2 weeks. She may require more than this. He is also using topical ketoconazole which I'll prescribe today The patient also has pressure ulcerations on her bilateral buttocks, right ischial tuberosity, left gluteal fold. There is also an ulceration on the posterior part of her labia which is concerning. 01/05/18; the patient is still using Diflucan 100 mg a day and ketoconazole daily. The widespread tinea rash is a lot better but still not resolved. With regards to wound; The area on the left upper buttock is resolved right upper buttock/mirror image wound is a lot better Right ischial tuberosity unchanged in terms of dimensions although the wound surface looks better Posterior part of the right labia unchanged. I find this area concerning, not a usual pressure area but certainly possible Left gluteal fold posteriorly a lot better using silver alginate Right anterior thigh think this was part of the extensive fungal rash/dermatitis. Wound looks stable though I don't think this is changed a lot. We managed to get this patient on hospital bed but she will get intermittent insisting on sleeping in a recliner. Her husband is up most night trying  to get her to stand up area and I emphasized to her the need to get into bed at night and get used to sleeping in a bed. I don't see an obvious physical reason for her to be sitting in a recliner. Roes, Kerrilynn L. ([id number]) Objective Constitutional Patient is hypertensive.. Pulse regular and within target range for patient.Marland Kitchen Respirations regular, non-labored and within target range.. Temperature is normal and within the target range for the patient.Marland Kitchen appears in no distress. Vitals Time Taken: 3:30 PM, Height: 63 in, Weight: 246 lbs, BMI: 43.6, Temperature: 98.1 F, Pulse: 91 bpm, Respiratory Rate: 18 breaths/min, Blood Pressure: 192/76 mmHg. Eyes Conjunctivae clear. No discharge. Respiratory Respiratory effort is easy and symmetric bilaterally. Rate is normal at rest and on room air.. Cardiovascular Heart rhythm and rate regular, without murmur or gallop.appears well-hydrated. Gastrointestinal (GI) large midline lower abdominal hernia however this reduces and is nontender. Genitourinary (GU) bladder is not enlarged. Lymphatic none palpable in the popliteal or inguinal area. General Notes: wound exam The superficial stage II on the left upper gluteus has closed. Right is smaller Right ischial tuberosity area is about the same in terms of depth however the wound looks satisfactory. Beginning epithelialization. Left gluteal fold upper thigh also looks better. Left posterior labia is about the same. There is no subcutaneous involvement right anterior thigh about the same Fungal rash is considerably better/tinea corporis Integumentary (Hair, Skin) widespread tinea rash in the lower abdomen pannus upper thighs considerably better. Also rash under the breasts considerably better. Wound #1 status is Open. Original cause of wound was Pressure Injury. The wound is located on the Right Gluteus. The wound measures 1.2cm length x 0.6cm width x 0.1cm depth; 0.565cm^2 area and  0.057cm^3 volume. There  is no tunneling or undermining noted. There is a large amount of serous drainage noted. The wound margin is distinct with the outline attached to the wound base. There is large (67-100%) red granulation within the wound bed. There is a small (1-33%) amount of necrotic tissue within the wound bed including Adherent Slough. The periwound skin appearance exhibited: Ecchymosis. The periwound skin appearance did not exhibit: Erythema. Periwound temperature was noted as No Abnormality. The periwound has tenderness on palpation. Wound #2 status is Open. Original cause of wound was Pressure Injury. The wound is located on the Left Gluteus. The wound measures 0.2cm length x 0.2cm width x 0.1cm depth; 0.031cm^2 area and 0.003cm^3 volume. There is no tunneling or undermining noted. There is a medium amount of serous drainage noted. The wound margin is distinct with the outline attached to the wound base. There is large (67-100%) red granulation within the wound bed. There is a small (1-33%) amount of necrotic tissue within the wound bed including Adherent Slough. The periwound skin appearance exhibited: Ecchymosis. Periwound temperature was noted as No Abnormality. The periwound has tenderness on palpation. Wound #3 status is Open. Original cause of wound was Pressure Injury. The wound is located on the Left Gluteal fold. The wound measures 1cm length x 0.4cm width x 0.1cm depth; 0.314cm^2 area and 0.031cm^3 volume. There is Fat Layer (Subcutaneous Tissue) Exposed exposed. There is no tunneling or undermining noted. There is a large amount of serous Escorcia, Cabrini L. ([id number]) drainage noted. The wound margin is distinct with the outline attached to the wound base. There is medium (34-66%) red granulation within the wound bed. There is a medium (34-66%) amount of necrotic tissue within the wound bed including Adherent Slough. The periwound skin appearance exhibited: Ecchymosis. Periwound temperature was noted  as No Abnormality. The periwound has tenderness on palpation. Wound #4 status is Healed - Epithelialized. Original cause of wound was Pressure Injury. The wound is located on the Left,Posterior Upper Leg. The wound measures 0cm length x 0cm width x 0cm depth; 0cm^2 area and 0cm^3 volume. Wound #5 status is Open. Original cause of wound was Pressure Injury. The wound is located on the Left Labia. The wound measures 1.5cm length x 1.2cm width x 0.1cm depth; 1.414cm^2 area and 0.141cm^3 volume. Wound #6 status is Open. Original cause of wound was Pressure Injury. The wound is located on the Right Gluteal fold. The wound measures 3.6cm length x 2.2cm width x 0.4cm depth; 6.22cm^2 area and 2.488cm^3 volume. Wound #7 status is Open. Original cause of wound was Gradually Appeared. The wound is located on the Right Upper Leg. The wound measures 2cm length x 2cm width x 0.1cm depth; 3.142cm^2 area and 0.314cm^3 volume. 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 Wound #2 Left Gluteus: Clean wound with Normal Saline. Wound #5 Left Labia: Clean wound with Normal Saline. Wound #6 Right Gluteal fold: Clean wound with Normal Saline. Wound #7 Right Upper Leg: Clean wound with Normal Saline. Anesthetic (add to Medication List): Wound #1 Right Gluteus: Topical Lidocaine 4% cream applied to wound bed prior to debridement (In Clinic  Only). Wound #2 Left Gluteus: Topical Lidocaine 4% cream applied to wound bed prior to debridement (In Clinic Only). Wound #5 Left  Labia: Topical Lidocaine 4% cream applied to wound bed prior to debridement (In Clinic Only). [place]sworth, Samanthajo L. ([id number]) Wound #6 Right Gluteal fold: Topical Lidocaine 4% cream applied to wound bed prior to debridement (In Clinic Only). Wound #7 Right Upper Leg: Topical Lidocaine 4% cream applied to wound bed prior to debridement (In Clinic Only). Primary Wound Dressing: Wound #1 Right Gluteus: Silver Alginate Wound #2 Left Gluteus: Silver Alginate Wound #5 Left Labia: Silver Alginate Wound #6 Right Gluteal fold: Silver Alginate Wound #7 Right Upper Leg: Silver Alginate Secondary Dressing: Wound #1 Right Gluteus: Boardered Foam Dressing Wound #2 Left Gluteus: Boardered Foam Dressing Wound #5 Left Labia: Boardered Foam Dressing Wound #6 Right Gluteal fold: Boardered Foam Dressing Wound #7 Right Upper Leg: Boardered Foam Dressing Dressing Change Frequency: Wound #1 Right Gluteus: Change Dressing Monday, Wednesday, Friday - HHRN please leave dressing supplies in case a change needs to be made in between [facility] visits. Wound #2 Left Gluteus: Change Dressing Monday, Wednesday, Friday [facility] please leave dressing supplies in case a change needs to be made in between [facility] visits. Wound #5 Left Labia: Change Dressing Monday, Wednesday, Friday [facility] please leave dressing supplies in case a change needs to be made in between [facility] visits. Wound #6 Right Gluteal fold: Change Dressing Monday, Wednesday, Friday [facility] please leave dressing supplies in case a change needs to be made in between [facility] visits. Wound #7 Right Upper Leg: Change Dressing Monday, Wednesday, Friday [facility] please leave dressing supplies in case a change needs to be made in between [facility] visits. Follow-up Appointments: Wound #1 Right Gluteus: Return Appointment in 2 weeks. Wound #2 Left Gluteus: Return Appointment in 2 weeks. Wound #5 Left Labia: Return Appointment in 2 weeks. Wound #6 Right Gluteal  fold: Return Appointment in 2 weeks. Wound #7 Right Upper Leg: Return Appointment in 2 weeks. Off-Loading: Wound #1 Right Gluteus: Roho cushion for wheelchair Turn and reposition every 2 hours Wound #2 Left Gluteus: Roho cushion for wheelchair Turn and reposition every 2 hours Backstrom, Kaylan L. ([id number]) Wound #5 Left Labia: Roho cushion for wheelchair Turn and reposition every 2 hours Wound #6 Right Gluteal fold: Roho cushion for wheelchair Turn and reposition every 2 hours Wound #7 Right Upper Leg: Roho cushion for wheelchair Turn and reposition every 2 hours Additional Orders / Instructions: Wound #1 Right Gluteus: Increase protein intake. Wound #2 Left Gluteus: Increase protein intake. Wound #5 Left Labia: Increase protein intake. Wound #6 Right Gluteal fold: Increase protein intake. Wound #7 Right Upper Leg: Increase protein intake. Home Health: Wound #1 Right Gluteus: Continue Home Health Visits - [place] Nurse may visit PRN to address patient s wound care needs. FACE TO FACE ENCOUNTER: MEDICARE and MEDICAID PATIENTS: I certify that this patient is under my care and that I had a face-to-face encounter that meets the physician face-to-face encounter requirements with this patient on this date. The encounter with the patient was in whole or in part for the following MEDICAL CONDITION: (primary reason for [place]) MEDICAL NECESSITY: I certify, that based on my findings, NURSING services are a medically necessary home health service. HOME BOUND STATUS: I certify that my clinical findings support that this patient is homebound (i.e., Due to illness or injury, pt requires aid of supportive devices such as crutches, cane, wheelchairs, walkers, the use of special  transportation or the assistance of another person to leave their place of residence. There is a normal inability to leave the home and doing so requires considerable and taxing effort. Other  absences are for medical reasons / religious services and are infrequent or of short duration when for other reasons). If current dressing causes regression in wound condition, may D/C ordered dressing product/s and apply Normal Saline Moist Dressing daily until next [place] / Other MD appointment. [place] of regression in wound condition at [phone]. Please direct any NON-WOUND related issues/requests for orders to patient's Primary Care Physician Wound #2 Left Gluteus: [place] Visits - [place] Nurse may visit PRN to address patient s wound care needs. FACE TO FACE ENCOUNTER: MEDICARE and MEDICAID PATIENTS: I certify that this patient is under my care and that I had a face-to-face encounter that meets the physician face-to-face encounter requirements with this patient on this date. The encounter with the patient was in whole or in part for the following MEDICAL CONDITION: (primary reason for [place]) MEDICAL NECESSITY: I certify, that based on my findings, NURSING services are a medically necessary home health service. HOME BOUND STATUS: I certify that my clinical findings support that this patient is homebound (i.e., Due to illness or injury, pt requires aid of supportive devices such as crutches, cane, wheelchairs, walkers, the use of special transportation or the assistance of another person to leave their place of residence. There is a normal inability to leave the home and doing so requires considerable and taxing effort. Other absences are for medical reasons / religious services and are infrequent or of short duration when for other reasons). If current dressing causes regression in wound condition, may D/C ordered dressing product/s and apply Normal Saline Moist Dressing daily until next [place] / Other MD appointment. [place] of regression in wound condition at [phone]. Please  direct any NON-WOUND related issues/requests for orders to patient's Primary Care Physician Wound #5 Left Labia: [place] Visits - [place] Nurse may visit PRN to address patient s wound care needs. FACE TO FACE ENCOUNTER: MEDICARE and MEDICAID PATIENTS: I certify that this patient is under my care and that I had a face-to-face encounter that meets the physician face-to-face encounter requirements with this patient on this date. The encounter with the patient was in whole or in part for the following MEDICAL CONDITION: (primary reason for [place]) MEDICAL NECESSITY: I certify, that based on my findings, NURSING services are a medically necessary home [place], TABITHIA STRODER. ([id number]) health service. HOME BOUND STATUS: I certify that my clinical findings support that this patient is homebound (i.e., Due to illness or injury, pt requires aid of supportive devices such as crutches, cane, wheelchairs, walkers, the use of special transportation or the assistance of another person to leave their place of residence. There is a normal inability to leave the home and doing so requires considerable and taxing effort. Other absences are for medical reasons / religious services and are infrequent or of short duration when for other reasons). If current dressing causes regression in wound condition, may D/C ordered dressing product/s and apply Normal Saline Moist Dressing daily until next [place] / Other MD appointment. [place] of regression in wound condition at [phone]. Please direct any NON-WOUND related issues/requests for orders to patient's Primary Care Physician Wound #6 Right Gluteal fold: [place] Visits - [facility]  Nurse may visit PRN to address patient s wound care needs. FACE TO FACE ENCOUNTER: MEDICARE and MEDICAID PATIENTS: I certify that this patient is under my care and that I had a face-to-face encounter  that meets the physician face-to-face encounter requirements with this patient on this date. The encounter with the patient was in whole or in part for the following MEDICAL CONDITION: (primary reason for [place]) MEDICAL NECESSITY: I certify, that based on my findings, NURSING services are a medically necessary home health service. HOME BOUND STATUS: I certify that my clinical findings support that this patient is homebound (i.e., Due to illness or injury, pt requires aid of supportive devices such as crutches, cane, wheelchairs, walkers, the use of special transportation or the assistance of another person to leave their place of residence. There is a normal inability to leave the home and doing so requires considerable and taxing effort. Other absences are for medical reasons / religious services and are infrequent or of short duration when for other reasons). If current dressing causes regression in wound condition, may D/C ordered dressing product/s and apply Normal Saline Moist Dressing daily until next [place] / Other MD appointment. [place] of regression in wound condition at [phone]. Please direct any NON-WOUND related issues/requests for orders to patient's Primary Care Physician Wound #7 Right Upper Leg: [place] Visits - [place] Nurse may visit PRN to address patient s wound care needs. FACE TO FACE ENCOUNTER: MEDICARE and MEDICAID PATIENTS: I certify that this patient is under my care and that I had a face-to-face encounter that meets the physician face-to-face encounter requirements with this patient on this date. The encounter with the patient was in whole or in part for the following MEDICAL CONDITION: (primary reason for [place]) MEDICAL NECESSITY: I certify, that based on my findings, NURSING services are a medically necessary home health service. HOME BOUND STATUS: I certify that my clinical findings  support that this patient is homebound (i.e., Due to illness or injury, pt requires aid of supportive devices such as crutches, cane, wheelchairs, walkers, the use of special transportation or the assistance of another person to leave their place of residence. There is a normal inability to leave the home and doing so requires considerable and taxing effort. Other absences are for medical reasons / religious services and are infrequent or of short duration when for other reasons). If current dressing causes regression in wound condition, may D/C ordered dressing product/s and apply Normal Saline Moist Dressing daily until next [place] / Other MD appointment. [place] of regression in wound condition at [phone]. Please direct any NON-WOUND related issues/requests for orders to patient's Primary Care Physician Medications-please add to medication list.: Wound #1 Right Gluteus: Other: - Ketoconazole cream to reddened areas Other: - Continue Diflucan for rash Wound #2 Left Gluteus: Other: - Ketoconazole cream to reddened areas Other: - Continue Diflucan for rash Wound #5 Left Labia: Other: - Ketoconazole cream to reddened areas Other: - Continue Diflucan for rash Wound #6 Right Gluteal fold: Other: - Ketoconazole cream to reddened areas Other: - Continue Diflucan for rash Wound #7 Right Upper Leg: Other: - Ketoconazole cream to reddened areas Other: - Continue Diflucan for rash The following medication(s) was prescribed: Diflucan oral 100 mg tablet 1 tablet oral qd for a further 2 weeks starting 01/05/2018 Mates, Tiffeny L. ([id number]) ketoconazole topical 2 % cream cream topical apply daily to affected areas continuing rx  starting 01/05/2018 Medical Decision Making Pressure ulcer of right buttock, stage 2 12/15/2017 Status: Improving Complications: N/a Interventions: continue silver alginate and best attempt at aggressive offloading Pressure ulcer of left  buttock, stage 2 12/15/2017 Status: Improving Complications: N/a Interventions: resolved Pressure ulcer of left buttock, stage 3 12/15/2017 Status: Improving Complications: N/a Interventions: continuing with silver alginate based dressings. 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 Widespread tinea infection in the wound areas could impede wound healing Interventions: ketoconazole 2% cream Oral Diflucan 100 mg a day continued for 2 weeks Unspecified open wound of unspecified external genital organs, female, subsequent encounter 12/15/2017 Status: No change Complications: N/a Interventions: given the situation is possible this is a pressure ulcer on the posterior aspect of her right labia. Close monitoring for progression. This would dictate a biopsy #1 continuous silver alginate to all wounds #2 continue Diflucan 100 mg a day for a further 2 weeks. Ketoconazole cream 2% to rash area daily to continue i have escribed both of these drugs #3 I have counseled the patient and her husband that pressure ulcers on the buttock do not need to be spending all night in a recliner. The risk of further deterioration is high. They both expressed understanding. #4 in addition to topical therapies cleaning and drying the areas under the pannus groin at least once a day is critical Electronic Signature(s) Signed: 01/05/2018 6:35:58 PM By: Linton Ham MD Entered By: Linton Ham on 01/05/2018 16:53:55 Stankovic, Terrion L. ([id number]) -------------------------------------------------------------------------------- SuperBill Details Patient Name: Hurtubise, Avital L. Date of Service: 01/05/2018 Medical Record Number: [id number] Patient Account Number:  [id number] Date of Birth/Sex: 05-08-1929 (84 y.o. 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 B35.4 Tinea corporis S31.502D Unspecified open wound of unspecified external genital organs, female, subsequent encounter Physician Procedures CPT4 Code: [id number] Description: 95284 - WC PHYS LEVEL 4 - EST PT ICD-10 Diagnosis Description L89.323 Pressure ulcer of left buttock, stage 3 L89.312 Pressure ulcer of right buttock, stage 2 L89.322 Pressure ulcer of left buttock, stage 2 L97.111 Non-pressure chronic ulcer  of right thigh limited to breakd Modifier: own of skin Quantity: 1 Electronic Signature(s) Signed: 01/05/2018 6:35:58 PM By: Linton Ham MD Entered By: Linton Ham on 01/05/2018 16:54:32

## 2018-01-07 NOTE — Progress Notes (Signed)
PARYS, ELENBAAS ([id number]) Visit Report for 01/05/2018 Arrival Information Details Patient Name: [name], [name] Date of Service: 01/05/2018 3:00 PM Medical Record Number: [id number] Patient Account Number: [id number] Date of Birth/Sex: 1928/07/18 (84 y.o. F) Treating RN: Roger Shelter Primary Care Jaid Quirion: Ramonita Lab Other Clinician: Referring Rhealynn Myhre: Ramonita Lab Treating Leelyn Jasinski/Extender: Tito Dine in Treatment: 3 Visit Information History Since Last Visit All ordered tests and consults were completed: No Patient Arrived: Wheel Chair Added or deleted any medications: No Arrival Time: 15:33 Any new allergies or adverse reactions: No Accompanied By: husband Had a fall or experienced change in No Transfer Assistance: [employer] activities of daily living that may affect Patient Identification Verified: Yes risk of falls: Secondary Verification Process Yes Signs or symptoms of abuse/neglect since last visito No Completed: Hospitalized since last visit: No Patient Requires Transmission-Based No Implantable device outside of the clinic excluding No Precautions: cellular tissue based products placed in the center Patient Has Alerts: Yes since last visit: Patient Alerts: Patient on Blood Pain Present Now: Yes Thinner DM II Warfarin Electronic Signature(s) Signed: 01/05/2018 5:20:09 PM By: Roger Shelter Entered By: Roger Shelter on 01/05/2018 15:34:17 Sawyers, Michalene Carlean [name] ([id number]) -------------------------------------------------------------------------------- Encounter Discharge Information Details Patient Name: [name], [name] Date of Service: 01/05/2018 3:00 PM Medical Record Number: [id number] Patient Account Number: [id number] Date of Birth/Sex: 1928/09/25 (84 y.o. F) Treating RN: Montey Hora Primary Care Zarai Orsborn: Ramonita Lab Other Clinician: Referring Ameya Vowell: Ramonita Lab Treating Sukaina Toothaker/Extender: Tito Dine in Treatment:  3 Encounter Discharge Information Items Discharge Condition: Stable Ambulatory Status: Wheelchair Discharge Destination: Home Transportation: Private Auto Accompanied By: spouse Schedule Follow-up Appointment: Yes Clinical Summary of Care: Electronic Signature(s) Signed: 01/05/2018 5:12:38 PM By: Montey Hora Entered By: Montey Hora on 01/05/2018 17:12:38 Brendel, Adanely L. ([id number]) -------------------------------------------------------------------------------- Lower Extremity Assessment Details Patient Name: [name], [name] Date of Service: 01/05/2018 3:00 PM Medical Record Number: [id number] Patient Account Number: [id number] Date of Birth/Sex: Jun 27, 1929 (84 y.o. F) Treating RN: Roger Shelter Primary Care Zaid Tomes: Ramonita Lab Other Clinician: Referring Seryna Marek: Ramonita Lab Treating Daltin Crist/Extender: Ricard Dillon Weeks in Treatment: 3 Electronic Signature(s) Signed: 01/05/2018 5:20:09 PM By: Roger Shelter Entered By: Roger Shelter on 01/05/2018 15:51:52 [name], [name] ([id number]) -------------------------------------------------------------------------------- Multi Wound Chart Details Patient Name: [name], [name] Date of Service: 01/05/2018 3:00 PM Medical Record Number: [id number] Patient Account Number: [id number] Date of Birth/Sex: [birth year] (84 y.o. F) Treating RN: Cornell Barman Primary Care Basheer Molchan: Ramonita Lab Other Clinician: Referring Wednesday Ericsson: Ramonita Lab Treating Genevia Bouldin/Extender: Tito Dine in Treatment: 3 Vital Signs Height(in): 63 Pulse(bpm): 91 Weight(lbs): 246 Blood Pressure(mmHg): 192/76 Body Mass Index(BMI): 44 Temperature(F): 98.1 Respiratory Rate 18 (breaths/min): Photos: [1:No Photos] [2:No Photos] [3:No Photos] Wound Location: [1:Right Gluteus] [2:Left Gluteus] [3:Left Gluteal fold] Wounding Event: [1:Pressure Injury] [2:Pressure Injury] [3:Pressure Injury] Primary Etiology: [1:Pressure Ulcer] [2:Pressure Ulcer]  [3:Pressure Ulcer] Comorbid History: [1:Cataracts, Glaucoma, Hypertension, Type II Diabetes, Gout, Osteoarthritis, Dementia, Received Radiation] [2:Cataracts, Glaucoma, Hypertension, Type II Diabetes, Gout, Osteoarthritis, Dementia, Received Radiation] [3:Cataracts,  Glaucoma, Hypertension, Type II Diabetes, Gout, Osteoarthritis, Dementia, Received Radiation] Date Acquired: [1:09/14/2017] [2:09/14/2017] [3:09/14/2017] Weeks of Treatment: [1:3] [2:3] [3:3] Wound Status: [1:Open] [2:Open] [3:Open] Clustered Wound: [1:No] [2:No] [3:No] Measurements L x W x D [1:1.2x0.6x0.1] [2:0.2x0.2x0.1] [3:1x0.4x0.1] (cm) Area (cm) : [1:0.565] [2:0.031] [3:0.314] Volume (cm) : [1:0.057] [2:0.003] [3:0.031] % Reduction in Area: [1:29.50%] [2:80.30%] [3:64.30%] % Reduction in Volume: [1:28.80%] [2:81.30%] [3:82.40%] Classification: [1:Category/Stage II] [2:Category/Stage II] [3:Category/Stage III] Exudate Amount: [1:Large] [2:Medium] [3:Large] Exudate Type: [1:Serous] [2:Serous] [  3:Serous] Exudate Color: [1:amber] [2:amber] [3:amber] Wound Margin: [1:Distinct, outline attached] [2:Distinct, outline attached] [3:Distinct, outline attached] Granulation Amount: [1:Large (67-100%)] [2:Large (67-100%)] [3:Medium (34-66%)] Granulation Quality: [1:Red] [2:Red] [3:Red] Necrotic Amount: [1:Small (1-33%)] [2:Small (1-33%)] [3:Medium (34-66%)] Exposed Structures: [1:Fascia: No Fat Layer (Subcutaneous Tissue) Exposed: No Tendon: No Muscle: No Joint: No Bone: No] [2:Fascia: No Fat Layer (Subcutaneous Tissue) Exposed: No Tendon: No Muscle: No Joint: No Bone: No] [3:Fat Layer (Subcutaneous Tissue) Exposed: Yes  Fascia: No Tendon: No Muscle: No Joint: No Bone: No] Epithelialization: [1:None] [2:Large (67-100%)] [3:None] Periwound Skin Texture: [1:No Abnormalities Noted] [2:No Abnormalities Noted] [3:No Abnormalities Noted] Periwound Skin Moisture: [1:No Abnormalities Noted] [2:No Abnormalities Noted] [3:No Abnormalities  Noted] Periwound Skin Color: [2:Ecchymosis: Yes] [3:Ecchymosis: Yes] Ecchymosis: Yes Erythema: No Temperature: No Abnormality No Abnormality No Abnormality Tenderness on Palpation: Yes Yes Yes Wound Preparation: Ulcer Cleansing: Ulcer Cleansing: Ulcer Cleansing: Rinsed/Irrigated with Saline Rinsed/Irrigated with Saline Rinsed/Irrigated with Saline Topical Anesthetic Applied: Topical Anesthetic Applied: Topical Anesthetic Applied: Other: lidocaine 4% Other: lidocaine 4% Other: lidocaine 4% Wound Number: [id number]  Photos: No Photos No Photos No Photos Wound Location: Left, Posterior Upper Leg Left Labia Right Gluteal fold Wounding Event: Pressure Injury Pressure Injury Pressure Injury Primary Etiology: Pressure Ulcer Pressure Ulcer Pressure Ulcer Comorbid History: N/A N/A N/A Date Acquired: 09/14/2017 09/14/2017 09/14/2017 Weeks of Treatment: 3 3 3  Wound Status: Healed - Epithelialized Open Open Clustered Wound: Yes No No Measurements L x W x D 0x0x0 1.5x1.2x0.1 3.6x2.2x0.4 (cm) Area (cm) : 0 1.414 6.22 Volume (cm) : 0 0.141 2.488 % Reduction in Area: 100.00% -17.60% -183.90% % Reduction in Volume: 100.00% -17.50% -468.00% Classification: Category/Stage II Category/Stage II Category/Stage II Exudate Amount: N/A N/A N/A Exudate Type: N/A N/A N/A Exudate Color: N/A N/A N/A Wound Margin: N/A N/A N/A Granulation Amount: N/A N/A N/A Granulation Quality: N/A N/A N/A Necrotic Amount: N/A N/A N/A Exposed Structures: N/A N/A N/A Epithelialization: N/A N/A N/A Periwound Skin Texture: No Abnormalities Noted No Abnormalities Noted No Abnormalities Noted Periwound Skin Moisture: No Abnormalities Noted No Abnormalities Noted No Abnormalities Noted Periwound Skin Color: No Abnormalities Noted No Abnormalities Noted No Abnormalities Noted Temperature: N/A N/A N/A Tenderness on Palpation: No No No Wound Preparation: N/A N/A N/A Wound Number: 7 N/A N/A Photos: No Photos N/A N/A Wound  Location: Right Upper Leg N/A N/A Wounding Event: Gradually Appeared N/A N/A Primary Etiology: Fungal N/A N/A Comorbid History: N/A N/A N/A Date Acquired: 07/13/2017 N/A N/A Weeks of Treatment: 3 N/A N/A Wound Status: Open N/A N/A Clustered Wound: No N/A N/A Measurements L x W x D 2x2x0.1 N/A N/A (cm) Area (cm) : 3.142 N/A N/A [name], [name] ([id number]) Volume (cm) : 0.314 N/A N/A % Reduction in Area: -48.10% N/A N/A % Reduction in Volume: -48.10% N/A N/A Classification: Full Thickness Without N/A N/A Exposed Support Structures Exudate Amount: N/A N/A N/A Exudate Type: N/A N/A N/A Exudate Color: N/A N/A N/A Wound Margin: N/A N/A N/A Granulation Amount: N/A N/A N/A Granulation Quality: N/A N/A N/A Necrotic Amount: N/A N/A N/A Exposed Structures: N/A N/A N/A Epithelialization: N/A N/A N/A Periwound Skin Texture: No Abnormalities Noted N/A N/A Periwound Skin Moisture: No Abnormalities Noted N/A N/A Periwound Skin Color: No Abnormalities Noted N/A N/A Temperature: N/A N/A N/A Tenderness on Palpation: No N/A N/A Wound Preparation: N/A N/A N/A Treatment Notes Electronic Signature(s) Signed: 01/05/2018 6:35:58 PM By: Linton Ham MD Entered By: Linton Ham on 01/05/2018 16:45:08 [name], [name] ([id number]) -------------------------------------------------------------------------------- Multi-Disciplinary Care Plan Details Patient Name:  Sylvester, Shavonne L. Date of Service: 01/05/2018 3:00 PM Medical Record Number: [id number] Patient Account Number: [id number] Date of Birth/Sex: [birth year] (84 y.o. F) Treating RN: Cornell Barman Primary Care Yuli Lanigan: Ramonita Lab Other Clinician: Referring Cliford Sequeira: Ramonita Lab Treating Dameian Crisman/Extender: Tito Dine in Treatment: 3 Active Inactive ` Orientation to the Wound Care Program Nursing Diagnoses: Knowledge deficit related to the wound healing center program Goals: Patient/caregiver will verbalize understanding of the  [place] Program Date Initiated: 12/15/2017 Target Resolution Date: 12/15/2017 Goal Status: Active Interventions: Provide education on orientation to the wound center Notes: ` Pressure Nursing Diagnoses: Knowledge deficit related to management of pressures ulcers Goals: Patient/caregiver will verbalize understanding of pressure ulcer management Date Initiated: 12/15/2017 Target Resolution Date: 12/16/2017 Goal Status: Active Interventions: Assess: immobility, friction, shearing, incontinence upon admission and as needed Notes: ` Wound/Skin Impairment Nursing Diagnoses: Knowledge deficit related to ulceration/compromised skin integrity Goals: Ulcer/skin breakdown will have a volume reduction of 80% by week 12 Date Initiated: 12/15/2017 Target Resolution Date: 06/16/2018 Goal Status: Active Interventions: [name], [name] ([id number]) Assess patient/caregiver ability to obtain necessary supplies Provide education on ulcer and skin care Treatment Activities: Topical wound management initiated : 12/15/2017 Notes: Electronic Signature(s) Signed: 01/05/2018 5:44:56 PM By: Gretta Cool, BSN, RN, CWS, Kim RN, BSN Entered By: Gretta Cool, BSN, RN, CWS, Kim on 01/05/2018 16:09:28 [name], [name] ([id number]) -------------------------------------------------------------------------------- Pain Assessment Details Patient Name: Waltermire, Mardy L. Date of Service: 01/05/2018 3:00 PM Medical Record Number: [id number] Patient Account Number: [id number] Date of Birth/Sex: 1928/11/22 (84 y.o. F) Treating RN: Roger Shelter Primary Care Arlyne Brandes: Ramonita Lab Other Clinician: Referring Areej Tayler: Ramonita Lab Treating Esley Brooking/Extender: Tito Dine in Treatment: 3 Active Problems Location of Pain Severity and Description of Pain Patient Has Paino Yes Site Locations Pain Location: Generalized Pain Rate the pain. Current Pain Level: 10 Pain Management and Medication Current Pain  Management: Electronic Signature(s) Signed: 01/05/2018 5:20:09 PM By: Roger Shelter Entered By: Roger Shelter on 01/05/2018 15:35:22 [name], [name] ([id number]) -------------------------------------------------------------------------------- Patient/Caregiver Education Details Patient Name: [name], [name] Date of Service: 01/05/2018 3:00 PM Medical Record Number: [id number] Patient Account Number: [id number] Date of Birth/Gender: [birth year] (84 y.o. F) Treating RN: Montey Hora Primary Care Physician: Ramonita Lab Other Clinician: Referring Physician: Ramonita Lab Treating Physician/Extender: Tito Dine in Treatment: 3 Education Assessment Education Provided To: Patient and Caregiver Education Topics Provided Wound/Skin Impairment: Handouts: Other: wound care as ordered Methods: Demonstration, Explain/Verbal Responses: State content correctly Electronic Signature(s) Signed: 01/05/2018 5:20:21 PM By: Montey Hora Entered By: Montey Hora on 01/05/2018 17:12:54 [name], [name] ([id number]) -------------------------------------------------------------------------------- Wound Assessment Details Patient Name: [name], Leightyn L. Date of Service: 01/05/2018 3:00 PM Medical Record Number: [id number] Patient Account Number: [id number] Date of Birth/Sex: 10/06/1928 (84 y.o. F) Treating RN: Roger Shelter Primary Care Karena Kinker: Ramonita Lab Other Clinician: Referring Giliana Vantil: Ramonita Lab Treating Aaryn Parrilla/Extender: Ricard Dillon Weeks in Treatment: 3 Wound Status Wound Number: 1 Primary Pressure Ulcer Etiology: Wound Location: Right Gluteus Wound Open Wounding Event: Pressure Injury Status: Date Acquired: 09/14/2017 Comorbid Cataracts, Glaucoma, Hypertension, Type II Weeks Of Treatment: 3 History: Diabetes, Gout, Osteoarthritis, Dementia, Clustered Wound: No Received Radiation Photos Photo Uploaded By: Roger Shelter on 01/05/2018 17:12:39 Wound  Measurements Length: (cm) 1.2 Width: (cm) 0.6 Depth: (cm) 0.1 Area: (cm) 0.565 Volume: (cm) 0.057 % Reduction in Area: 29.5% % Reduction in Volume: 28.8% Epithelialization: None Tunneling: No Undermining: No Wound Description Classification: Category/Stage II Wound Margin: Distinct, outline attached Exudate Amount: Large Exudate Type: Serous Exudate  Color: amber Foul Odor After Cleansing: No Slough/Fibrino Yes Wound Bed Granulation Amount: Large (67-100%) Exposed Structure Granulation Quality: Red Fascia Exposed: No Necrotic Amount: Small (1-33%) Fat Layer (Subcutaneous Tissue) Exposed: No Necrotic Quality: Adherent Slough Tendon Exposed: No Muscle Exposed: No Joint Exposed: No Bone Exposed: No Periwound Skin Texture Sisler, Kahlee L. ([id number]) Texture Color No Abnormalities Noted: No No Abnormalities Noted: No Ecchymosis: Yes Moisture Erythema: No No Abnormalities Noted: No Temperature / Pain Temperature: No Abnormality Tenderness on Palpation: Yes Wound Preparation Ulcer Cleansing: Rinsed/Irrigated with Saline Topical Anesthetic Applied: Other: lidocaine 4%, Treatment Notes Wound #1 (Right Gluteus) 1. Cleansed with: Clean wound with Normal Saline 3. Peri-wound Care: Antifungal cream 4. Dressing Applied: Calcium Alginate with Silver 5. Secondary Dressing Applied Bordered Foam Dressing Notes silvercel, secured with undergarment Electronic Signature(s) Signed: 01/05/2018 5:20:09 PM By: Roger Shelter Entered By: Roger Shelter on 01/05/2018 16:13:40 Dezeeuw, Haani L. ([id number]) -------------------------------------------------------------------------------- Wound Assessment Details Patient Name: Stolz, Jaydalyn L. Date of Service: 01/05/2018 3:00 PM Medical Record Number: [id number] Patient Account Number: [id number] Date of Birth/Sex: March 31, 1929 (84 y.o. F) Treating RN: Roger Shelter Primary Care Maday Guarino: Ramonita Lab Other Clinician: Referring  Khaleelah Yowell: Ramonita Lab Treating Jonanthan Bolender/Extender: Ricard Dillon Weeks in Treatment: 3 Wound Status Wound Number: 2 Primary Pressure Ulcer Etiology: Wound Location: Left Gluteus Wound Open Wounding Event: Pressure Injury Status: Date Acquired: 09/14/2017 Comorbid Cataracts, Glaucoma, Hypertension, Type II Weeks Of Treatment: 3 History: Diabetes, Gout, Osteoarthritis, Dementia, Clustered Wound: No Received Radiation Photos Photo Uploaded By: Roger Shelter on 01/05/2018 17:13:19 Wound Measurements Length: (cm) 0.2 Width: (cm) 0.2 Depth: (cm) 0.1 Area: (cm) 0.031 Volume: (cm) 0.003 % Reduction in Area: 80.3% % Reduction in Volume: 81.3% Epithelialization: Large (67-100%) Tunneling: No Undermining: No Wound Description Classification: Category/Stage II Wound Margin: Distinct, outline attached Exudate Amount: Medium Exudate Type: Serous Exudate Color: amber Foul Odor After Cleansing: No Slough/Fibrino No Wound Bed Granulation Amount: Large (67-100%) Exposed Structure Granulation Quality: Red Fascia Exposed: No Necrotic Amount: Small (1-33%) Fat Layer (Subcutaneous Tissue) Exposed: No Necrotic Quality: Adherent Slough Tendon Exposed: No Muscle Exposed: No Joint Exposed: No Bone Exposed: No Periwound Skin Texture Quinter, Jaislyn L. ([id number]) Texture Color No Abnormalities Noted: No No Abnormalities Noted: No Ecchymosis: Yes Moisture No Abnormalities Noted: No Temperature / Pain Temperature: No Abnormality Tenderness on Palpation: Yes Wound Preparation Ulcer Cleansing: Rinsed/Irrigated with Saline Topical Anesthetic Applied: Other: lidocaine 4%, Treatment Notes Wound #2 (Left Gluteus) 1. Cleansed with: Clean wound with Normal Saline 3. Peri-wound Care: Antifungal cream 4. Dressing Applied: Calcium Alginate with Silver 5. Secondary Dressing Applied Bordered Foam Dressing Notes silvercel, secured with undergarment Electronic Signature(s) Signed:  01/05/2018 5:20:09 PM By: Roger Shelter Entered By: Roger Shelter on 01/05/2018 16:14:14 Theriault, Rushie L. ([id number]) -------------------------------------------------------------------------------- Wound Assessment Details Patient Name: Monje, Domnique L. Date of Service: 01/05/2018 3:00 PM Medical Record Number: [id number] Patient Account Number: [id number] Date of Birth/Sex: 1929-06-10 (84 y.o. F) Treating RN: Roger Shelter Primary Care Mclean Moya: Ramonita Lab Other Clinician: Referring Taquita Demby: Ramonita Lab Treating Lucy Boardman/Extender: Ricard Dillon Weeks in Treatment: 3 Wound Status Wound Number: 3 Primary Pressure Ulcer Etiology: Wound Location: Left Gluteal fold Wound Open Wounding Event: Pressure Injury Status: Date Acquired: 09/14/2017 Comorbid Cataracts, Glaucoma, Hypertension, Type II Weeks Of Treatment: 3 History: Diabetes, Gout, Osteoarthritis, Dementia, Clustered Wound: No Received Radiation Photos Photo Uploaded By: Roger Shelter on 01/05/2018 17:13:19 Wound Measurements Length: (cm) 1 Width: (cm) 0.4 Depth: (cm) 0.1 Area: (cm) 0.314 Volume: (cm) 0.031 % Reduction in Area: 64.3% % Reduction  in Volume: 82.4% Epithelialization: None Tunneling: No Undermining: No Wound Description Classification: Category/Stage III Wound Margin: Distinct, outline attached Exudate Amount: Large Exudate Type: Serous Exudate Color: amber Wound Bed Granulation Amount: Medium (34-66%) Exposed Structure Granulation Quality: Red Fascia Exposed: No Necrotic Amount: Medium (34-66%) Fat Layer (Subcutaneous Tissue) Exposed: Yes Necrotic Quality: Adherent Slough Tendon Exposed: No Muscle Exposed: No Joint Exposed: No Bone Exposed: No Periwound Skin Texture Hankey, Evelyne L. ([id number]) Texture Color No Abnormalities Noted: No No Abnormalities Noted: No Ecchymosis: Yes Moisture No Abnormalities Noted: No Temperature / Pain Temperature: No Abnormality Tenderness on  Palpation: Yes Wound Preparation Ulcer Cleansing: Rinsed/Irrigated with Saline Topical Anesthetic Applied: Other: lidocaine 4%, Treatment Notes Wound #3 (Left Gluteal fold) 1. Cleansed with: Clean wound with Normal Saline 3. Peri-wound Care: Antifungal cream 4. Dressing Applied: Calcium Alginate with Silver 5. Secondary Dressing Applied Bordered Foam Dressing Notes silvercel, secured with undergarment Electronic Signature(s) Signed: 01/05/2018 5:20:09 PM By: Roger Shelter Entered By: Roger Shelter on 01/05/2018 16:15:47 Wence, Treniya L. ([id number]) -------------------------------------------------------------------------------- Wound Assessment Details Patient Name: Marovich, Naylea L. Date of Service: 01/05/2018 3:00 PM Medical Record Number: [id number] Patient Account Number: [id number] Date of Birth/Sex: 1928-08-27 (84 y.o. F) Treating RN: Roger Shelter Primary Care Yuleidy Rappleye: Ramonita Lab Other Clinician: Referring Cristin Szatkowski: Ramonita Lab Treating Jene Huq/Extender: Ricard Dillon Weeks in Treatment: 3 Wound Status Wound Number: 4 Primary Etiology: Pressure Ulcer Wound Location: Left, Posterior Upper Leg Wound Status: Healed - Epithelialized Wounding Event: Pressure Injury Date Acquired: 09/14/2017 Weeks Of Treatment: 3 Clustered Wound: Yes Photos Photo Uploaded By: Roger Shelter on 01/05/2018 17:13:57 Wound Measurements Length: (cm) 0 Width: (cm) 0 Depth: (cm) 0 Area: (cm) 0 Volume: (cm) 0 % Reduction in Area: 100% % Reduction in Volume: 100% Wound Description Classification: Category/Stage II Periwound Skin Texture Texture Color No Abnormalities Noted: No No Abnormalities Noted: No Moisture No Abnormalities Noted: No Electronic Signature(s) Signed: 01/05/2018 5:20:09 PM By: Roger Shelter Entered By: Roger Shelter on 01/05/2018 15:46:34 Hanaway, Thurza L.  ([id number]) -------------------------------------------------------------------------------- Wound Assessment Details Patient Name: Crosson, Shamonica L. Date of Service: 01/05/2018 3:00 PM Medical Record Number: [id number] Patient Account Number: [id number] Date of Birth/Sex: [birth year] (84 y.o. F) Treating RN: Roger Shelter Primary Care Geetika Laborde: Ramonita Lab Other Clinician: Referring Aedyn Kempfer: Ramonita Lab Treating Female Iafrate/Extender: Ricard Dillon Weeks in Treatment: 3 Wound Status Wound Number: 5 Primary Etiology: Pressure Ulcer Wound Location: Left Labia Wound Status: Open Wounding Event: Pressure Injury Date Acquired: 09/14/2017 Weeks Of Treatment: 3 Clustered Wound: No Photos Photo Uploaded By: Roger Shelter on 01/05/2018 17:13:58 Wound Measurements Length: (cm) 1.5 Width: (cm) 1.2 Depth: (cm) 0.1 Area: (cm) 1.414 Volume: (cm) 0.141 % Reduction in Area: -17.6% % Reduction in Volume: -17.5% Wound Description Classification: Category/Stage II Periwound Skin Texture Texture Color No Abnormalities Noted: No No Abnormalities Noted: No Moisture No Abnormalities Noted: No Treatment Notes Wound #5 (Left Labia) 1. Cleansed with: Clean wound with Normal Saline 3. Peri-wound Care: Antifungal cream 4. Dressing Applied: Loudin, Laiklynn L. ([id number]) Calcium Alginate with Silver 5. Secondary Dressing Applied Bordered Foam Dressing Notes silvercel, secured with undergarment Electronic Signature(s) Signed: 01/05/2018 5:20:09 PM By: Roger Shelter Entered By: Roger Shelter on 01/05/2018 15:46:34 Schlitt, Beatris L. ([id number]) -------------------------------------------------------------------------------- Wound Assessment Details Patient Name: Glazer, Avagail L. Date of Service: 01/05/2018 3:00 PM Medical Record Number: [id number] Patient Account Number: [id number] Date of Birth/Sex: Jan 19, 1929 (84 y.o. F) Treating RN: Roger Shelter Primary Care Clarity Ciszek: Ramonita Lab  Other Clinician: Referring Vontae Court: Ramonita Lab Treating Peggy Monk/Extender: Tito Dine  in Treatment: 3 Wound Status Wound Number: 6 Primary Etiology: Pressure Ulcer Wound Location: Right Gluteal fold Wound Status: Open Wounding Event: Pressure Injury Date Acquired: 09/14/2017 Weeks Of Treatment: 3 Clustered Wound: No Photos Photo Uploaded By: Roger Shelter on 01/05/2018 17:14:47 Wound Measurements Length: (cm) 3.6 Width: (cm) 2.2 Depth: (cm) 0.4 Area: (cm) 6.22 Volume: (cm) 2.488 % Reduction in Area: -183.9% % Reduction in Volume: -468% Wound Description Classification: Category/Stage II Periwound Skin Texture Texture Color No Abnormalities Noted: No No Abnormalities Noted: No Moisture No Abnormalities Noted: No Treatment Notes Wound #6 (Right Gluteal fold) 1. Cleansed with: Clean wound with Normal Saline 3. Peri-wound Care: Antifungal cream 4. Dressing Applied: Koppel, Kyriaki L. ([id number]) Calcium Alginate with Silver 5. Secondary Dressing Applied Bordered Foam Dressing Notes silvercel, secured with undergarment Electronic Signature(s) Signed: 01/05/2018 5:20:09 PM By: Roger Shelter Entered By: Roger Shelter on 01/05/2018 15:46:34 Laird, Ismahan L. ([id number]) -------------------------------------------------------------------------------- Wound Assessment Details Patient Name: Kinderman, Kayline L. Date of Service: 01/05/2018 3:00 PM Medical Record Number: [id number] Patient Account Number: [id number] Date of Birth/Sex: 1929-01-29 (84 y.o. F) Treating RN: Roger Shelter Primary Care Taiwo Fish: Ramonita Lab Other Clinician: Referring Remy Dia: Ramonita Lab Treating Jaydis Duchene/Extender: Ricard Dillon Weeks in Treatment: 3 Wound Status Wound Number: 7 Primary Etiology: Fungal Wound Location: Right Upper Leg Wound Status: Open Wounding Event: Gradually Appeared Date Acquired: 07/13/2017 Weeks Of Treatment: 3 Clustered Wound: No Photos Photo  Uploaded By: Roger Shelter on 01/05/2018 17:14:48 Wound Measurements Length: (cm) 2 Width: (cm) 2 Depth: (cm) 0.1 Area: (cm) 3.142 Volume: (cm) 0.314 % Reduction in Area: -48.1% % Reduction in Volume: -48.1% Wound Description Full Thickness Without Exposed Support Classification: Structures Periwound Skin Texture Texture Color No Abnormalities Noted: No No Abnormalities Noted: No Moisture No Abnormalities Noted: No Treatment Notes Wound #7 (Right Upper Leg) 1. Cleansed with: Clean wound with Normal Saline 3. Peri-wound Care: Antifungal cream Martorelli, Brandilynn L. ([id number]) 4. Dressing Applied: Calcium Alginate with Silver 5. Secondary Dressing Applied Bordered Foam Dressing Notes silvercel, secured with undergarment Electronic Signature(s) Signed: 01/05/2018 5:20:09 PM By: Roger Shelter Entered By: Roger Shelter on 01/05/2018 15:38:43 Sedgwick, Shevaun L. ([id number]) -------------------------------------------------------------------------------- Vitals Details Patient Name: Ocasio, Mileidy L. Date of Service: 01/05/2018 3:00 PM Medical Record Number: [id number] Patient Account Number: [id number] Date of Birth/Sex: Jun 09, 1929 (84 y.o. F) Treating RN: Roger Shelter Primary Care Kyler Lerette: Ramonita Lab Other Clinician: Referring Ronell Boldin: Ramonita Lab Treating Traylon Schimming/Extender: Tito Dine in Treatment: 3 Vital Signs Time Taken: 15:30 Temperature (F): 98.1 Height (in): 63 Pulse (bpm): 91 Weight (lbs): 246 Respiratory Rate (breaths/min): 18 Body Mass Index (BMI): 43.6 Blood Pressure (mmHg): 192/76 Reference Range: 80 - 120 mg / dl Electronic Signature(s) Signed: 01/05/2018 5:20:09 PM By: Roger Shelter Entered By: Roger Shelter on 01/05/2018 15:36:11

## 2018-01-19 ENCOUNTER — Ambulatory Visit: Payer: Medicare Other | Admitting: Internal Medicine

## 2018-01-20 ENCOUNTER — Other Ambulatory Visit: Payer: Medicare Other | Admitting: Urology

## 2018-01-28 IMAGING — CT CT ABD-PELV W/ CM
1 of 3 series · 14 of 32 positions shown, 19 images · IV contrast (APPLIED)
Comparison: None.

CLINICAL DATA: Progressive lower abdominal pain. Chronic
periumbilical hernias.

EXAM:
CT ABDOMEN AND PELVIS WITH CONTRAST
TECHNIQUE: Multidetector CT imaging of the abdomen and pelvis was performed
using the standard protocol following bolus administration of
intravenous contrast.
CONTRAST:  100mL BNCVEZ-8II IOPAMIDOL (BNCVEZ-8II) INJECTION 61%

[Series 2: axial st · axial · 0.98mm/px · z∈[-1082,-617]mm · 14 of 105 slices shown, 19 images]
[im 6/105  soft-tissue]
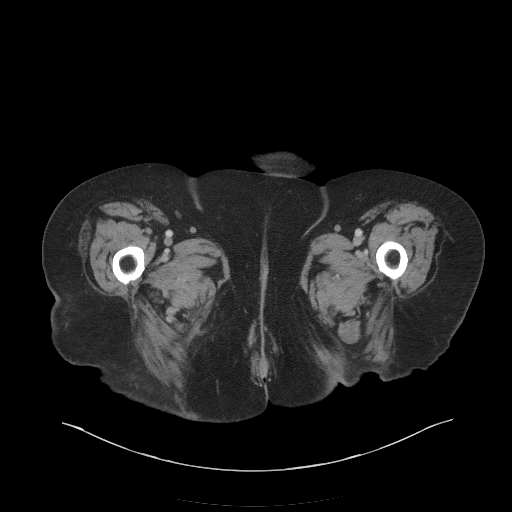
[im 6/105  bone]
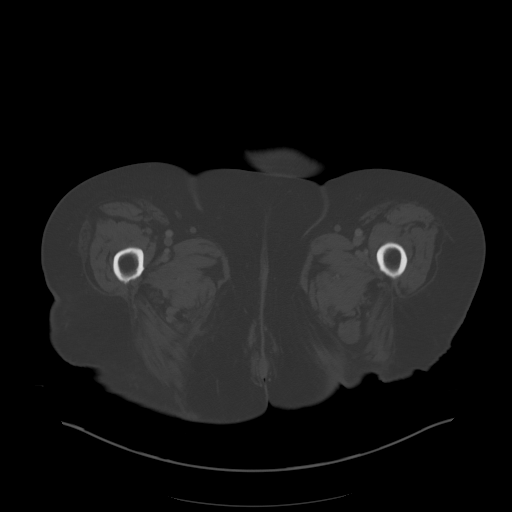
[im 17/105  soft-tissue]
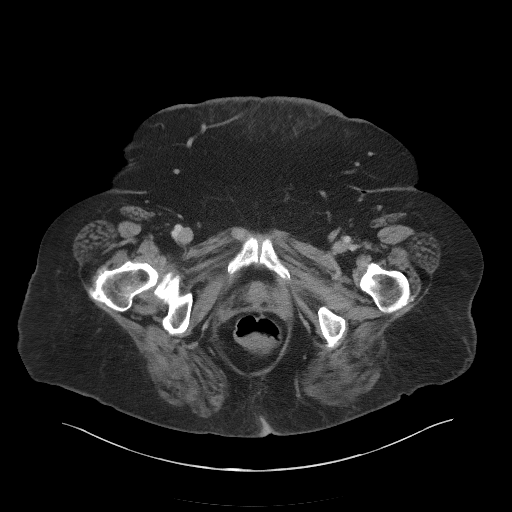
[im 22/105  soft-tissue]
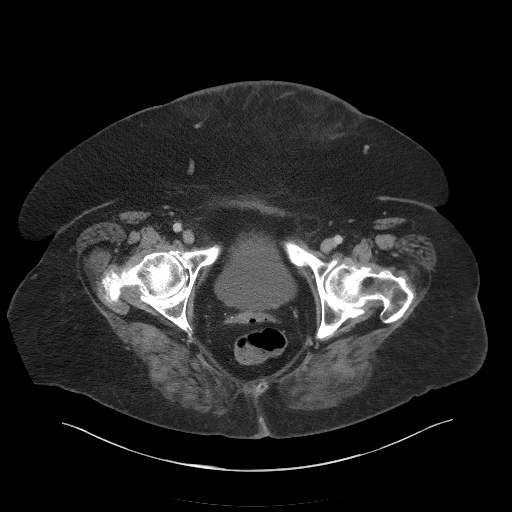
[im 28/105  soft-tissue]
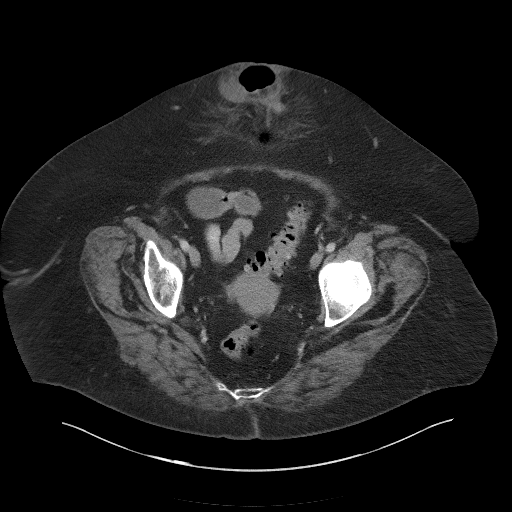
[im 39/105  soft-tissue]
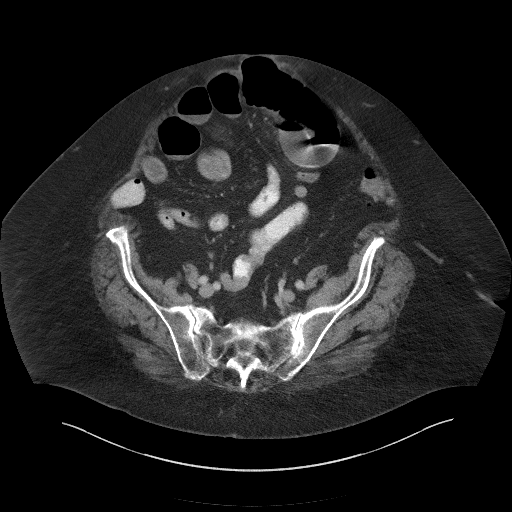
[im 44/105  soft-tissue]
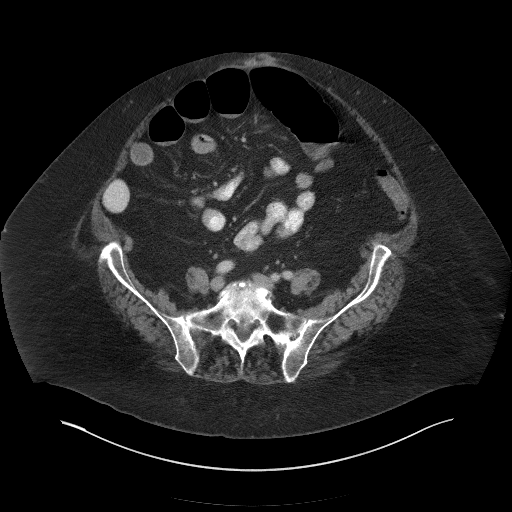
[im 55/105  soft-tissue]
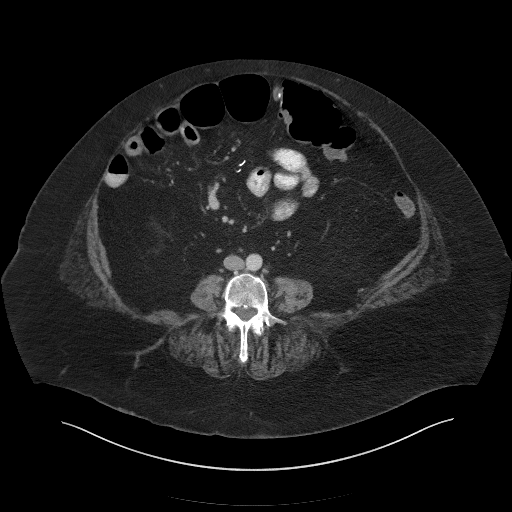
[im 61/105  soft-tissue]
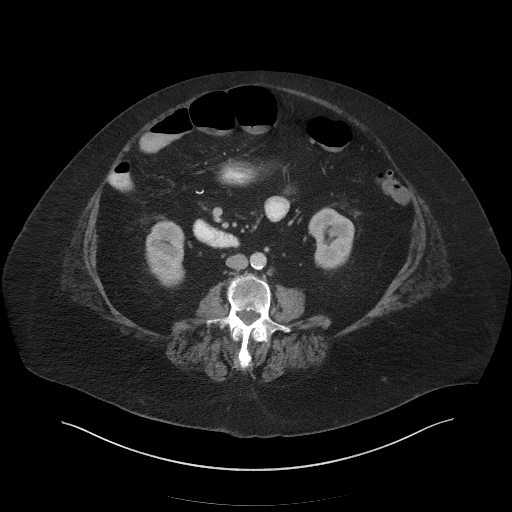
[im 66/105  soft-tissue]
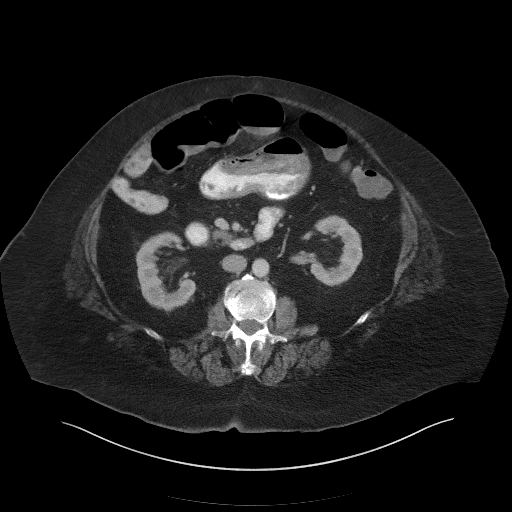
[im 66/105  bone]
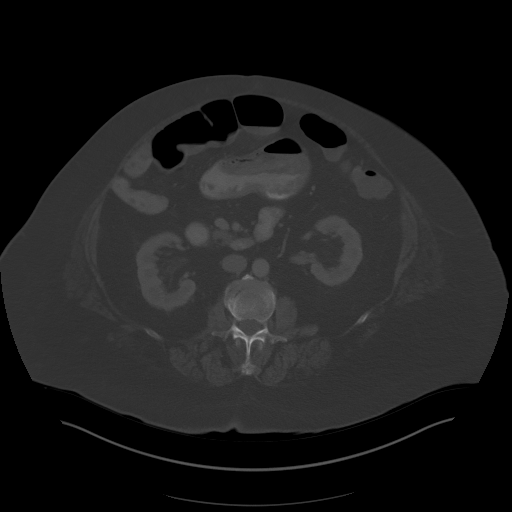
[im 77/105  soft-tissue]
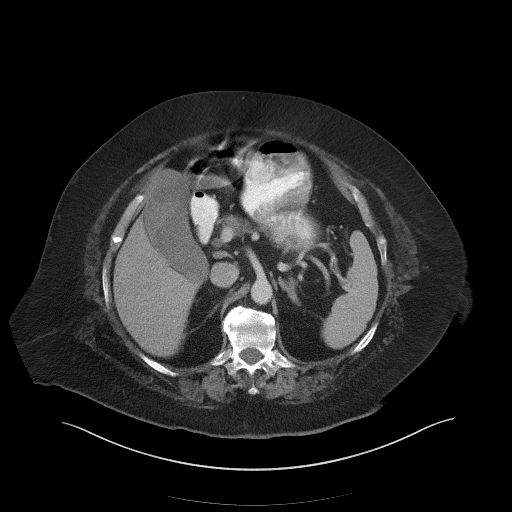
[im 83/105  soft-tissue]
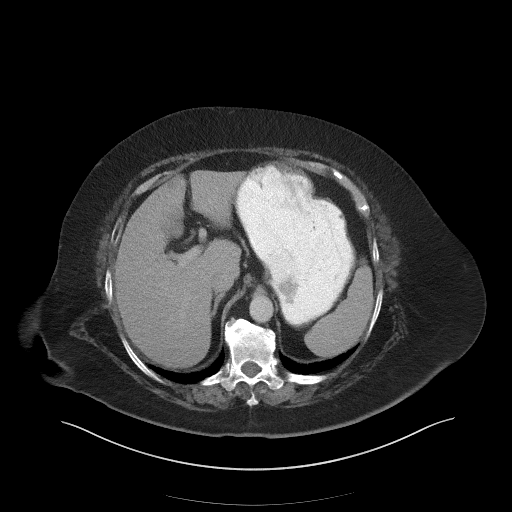
[im 83/105  lung]
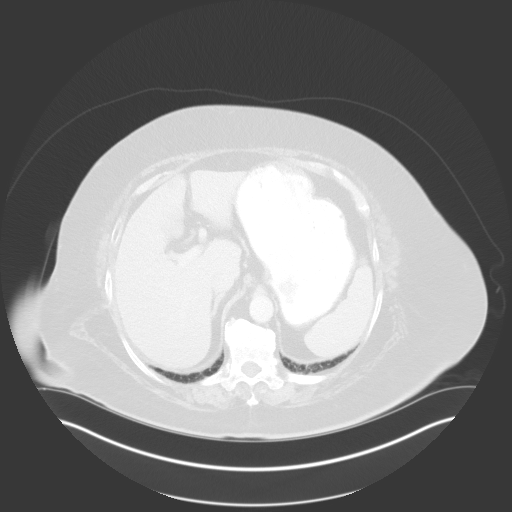
[im 88/105  soft-tissue]
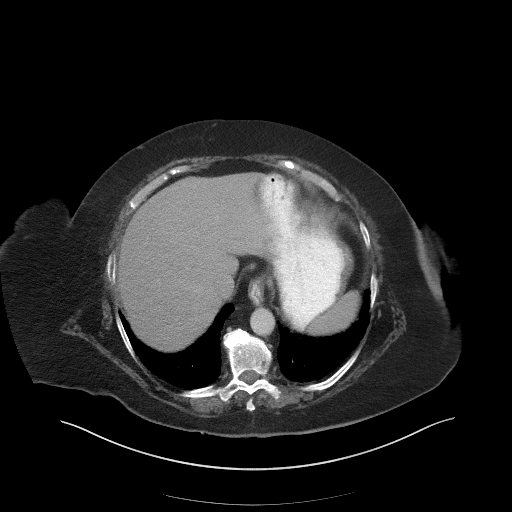
[im 88/105  lung]
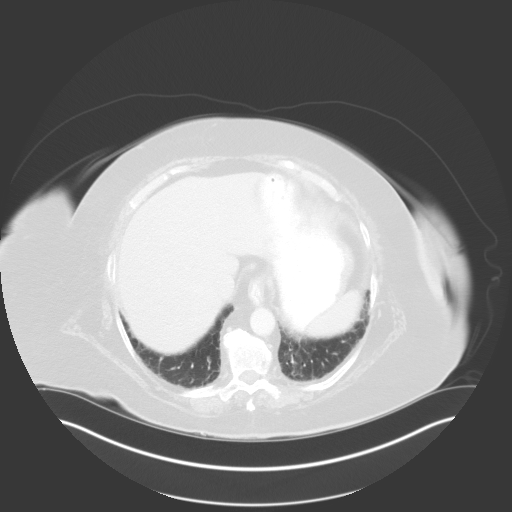
[im 94/105  lung]
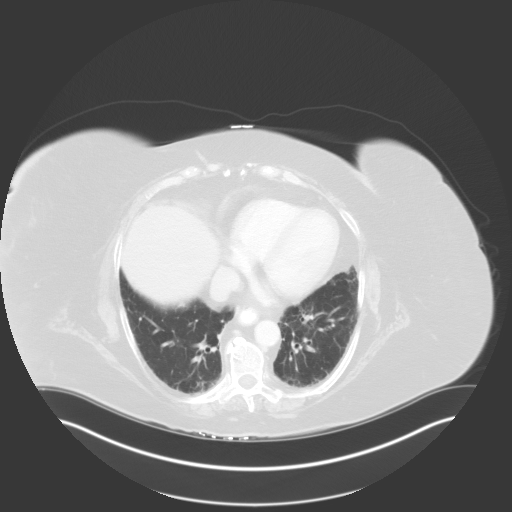
[im 99/105  soft-tissue]
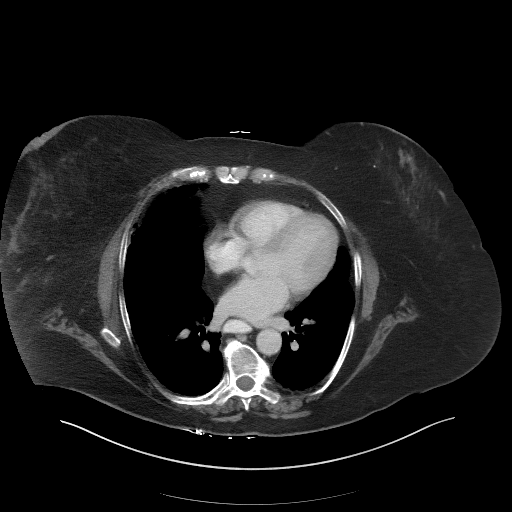
[im 99/105  lung]
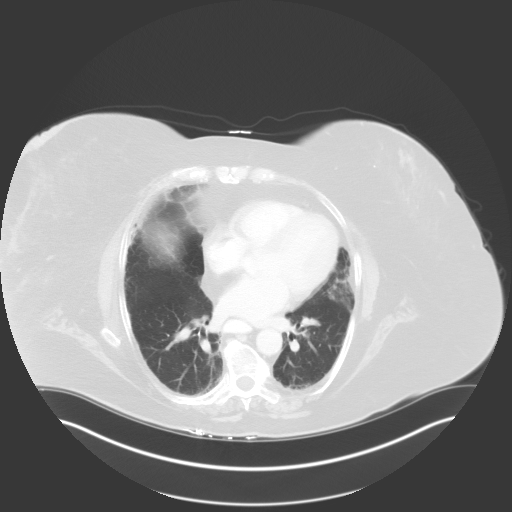

[14 of 32 positions shown; findings below may reference images not displayed]

FINDINGS: Lower chest: No acute abnormalities. Slight coronary artery
calcification. Slight chronic changes at the lung bases.

Hepatobiliary: Normal.

Pancreas: Normal.

Spleen: Normal.

Adrenals/Urinary Tract: Normal.

Stomach/Bowel: The patient has an incarcerated periumbilical hernia
just below the umbilicus. There is a loop of small bowel which
extends into the hernia sac and is dilated proximal to the sac and
decompressed distal to the sac consistent with partial obstruction.
There is edema in the soft tissues around the hernia and there is
also a small amount of fluid in the hernia sac. There is also a
focal hernia just above the umbilicus without incarceration.

The patient has had a hemicolectomy. There are multiple diverticula
in the remaining portion of the colon.

Vascular/Lymphatic: Minimal calcification in the abdominal aorta. No
adenopathy.

Reproductive: Atrophic uterus and ovaries are normal.

Other: No free air or free fluid.

Musculoskeletal: No acute bone abnormality. Moderately severe right
hip arthritis. Moderate left hip arthritis. Degenerative disc and
facet joint disease in the lumbar spine.
IMPRESSION: Incarcerated periumbilical hernia containing a loop of small bowel
creating partial small bowel obstruction. Inflammation in and around
the hernia sac.

Critical Value/emergent results were called by telephone at the time
of interpretation on 12/13/2015 at 
[DATE] to Dr. DERMOT LARGO , who
verbally acknowledged these results.

## 2018-02-02 ENCOUNTER — Encounter: Payer: Medicare Other | Attending: Internal Medicine | Admitting: Internal Medicine

## 2018-02-02 DIAGNOSIS — L89892 Pressure ulcer of other site, stage 2: Secondary | ICD-10-CM | POA: Insufficient documentation

## 2018-02-02 DIAGNOSIS — B354 Tinea corporis: Secondary | ICD-10-CM | POA: Insufficient documentation

## 2018-02-02 DIAGNOSIS — L97112 Non-pressure chronic ulcer of right thigh with fat layer exposed: Secondary | ICD-10-CM | POA: Diagnosis not present

## 2018-02-02 DIAGNOSIS — I10 Essential (primary) hypertension: Secondary | ICD-10-CM | POA: Insufficient documentation

## 2018-02-02 DIAGNOSIS — Z923 Personal history of irradiation: Secondary | ICD-10-CM | POA: Insufficient documentation

## 2018-02-02 DIAGNOSIS — F0390 Unspecified dementia without behavioral disturbance: Secondary | ICD-10-CM | POA: Insufficient documentation

## 2018-02-02 DIAGNOSIS — L89312 Pressure ulcer of right buttock, stage 2: Secondary | ICD-10-CM | POA: Diagnosis not present

## 2018-02-02 DIAGNOSIS — E11622 Type 2 diabetes mellitus with other skin ulcer: Secondary | ICD-10-CM | POA: Diagnosis not present

## 2018-02-02 DIAGNOSIS — L89323 Pressure ulcer of left buttock, stage 3: Secondary | ICD-10-CM | POA: Diagnosis not present

## 2018-02-07 ENCOUNTER — Other Ambulatory Visit: Payer: Medicare Other | Admitting: Urology

## 2018-02-16 ENCOUNTER — Ambulatory Visit: Payer: Medicare Other | Admitting: Nurse Practitioner

## 2018-02-24 ENCOUNTER — Ambulatory Visit: Payer: Medicare Other | Admitting: Nurse Practitioner

## 2018-03-02 ENCOUNTER — Ambulatory Visit: Payer: Medicare Other | Admitting: Internal Medicine

## 2018-03-03 ENCOUNTER — Other Ambulatory Visit: Payer: Medicare Other | Admitting: Urology

## 2018-03-04 ENCOUNTER — Inpatient Hospital Stay
Admission: EM | Admit: 2018-03-04 | Discharge: 2018-03-08 | DRG: 593 | Disposition: A | Discharge status: Skilled Nursing Facility | Payer: Medicare Other | Attending: Internal Medicine | Admitting: Internal Medicine

## 2018-03-04 ENCOUNTER — Other Ambulatory Visit: Payer: Self-pay

## 2018-03-04 DIAGNOSIS — Z86718 Personal history of other venous thrombosis and embolism: Secondary | ICD-10-CM | POA: Diagnosis not present

## 2018-03-04 DIAGNOSIS — Z515 Encounter for palliative care: Secondary | ICD-10-CM | POA: Diagnosis present

## 2018-03-04 DIAGNOSIS — Z853 Personal history of malignant neoplasm of breast: Secondary | ICD-10-CM

## 2018-03-04 DIAGNOSIS — B372 Candidiasis of skin and nail: Secondary | ICD-10-CM | POA: Diagnosis present

## 2018-03-04 DIAGNOSIS — M109 Gout, unspecified: Secondary | ICD-10-CM | POA: Diagnosis present

## 2018-03-04 DIAGNOSIS — M810 Age-related osteoporosis without current pathological fracture: Secondary | ICD-10-CM | POA: Diagnosis present

## 2018-03-04 DIAGNOSIS — Z79899 Other long term (current) drug therapy: Secondary | ICD-10-CM

## 2018-03-04 DIAGNOSIS — L89153 Pressure ulcer of sacral region, stage 3: Principal | ICD-10-CM | POA: Diagnosis present

## 2018-03-04 DIAGNOSIS — Z7901 Long term (current) use of anticoagulants: Secondary | ICD-10-CM | POA: Diagnosis not present

## 2018-03-04 DIAGNOSIS — E119 Type 2 diabetes mellitus without complications: Secondary | ICD-10-CM | POA: Diagnosis present

## 2018-03-04 DIAGNOSIS — Z7982 Long term (current) use of aspirin: Secondary | ICD-10-CM | POA: Diagnosis not present

## 2018-03-04 DIAGNOSIS — B49 Unspecified mycosis: Secondary | ICD-10-CM

## 2018-03-04 DIAGNOSIS — L89159 Pressure ulcer of sacral region, unspecified stage: Secondary | ICD-10-CM | POA: Diagnosis present

## 2018-03-04 DIAGNOSIS — Z96659 Presence of unspecified artificial knee joint: Secondary | ICD-10-CM | POA: Diagnosis present

## 2018-03-04 DIAGNOSIS — Z6841 Body Mass Index (BMI) 40.0 and over, adult: Secondary | ICD-10-CM

## 2018-03-04 DIAGNOSIS — Z9049 Acquired absence of other specified parts of digestive tract: Secondary | ICD-10-CM

## 2018-03-04 DIAGNOSIS — L304 Erythema intertrigo: Secondary | ICD-10-CM | POA: Diagnosis present

## 2018-03-04 DIAGNOSIS — Z833 Family history of diabetes mellitus: Secondary | ICD-10-CM | POA: Diagnosis not present

## 2018-03-04 DIAGNOSIS — Z85828 Personal history of other malignant neoplasm of skin: Secondary | ICD-10-CM

## 2018-03-04 DIAGNOSIS — H409 Unspecified glaucoma: Secondary | ICD-10-CM | POA: Diagnosis present

## 2018-03-04 DIAGNOSIS — Z923 Personal history of irradiation: Secondary | ICD-10-CM | POA: Diagnosis not present

## 2018-03-04 DIAGNOSIS — Z79811 Long term (current) use of aromatase inhibitors: Secondary | ICD-10-CM

## 2018-03-04 DIAGNOSIS — I10 Essential (primary) hypertension: Secondary | ICD-10-CM | POA: Diagnosis present

## 2018-03-04 LAB — COMPREHENSIVE METABOLIC PANEL
ALK PHOS: 90 U/L (ref 38–126)
ALT: 15 U/L (ref 0–44)
ANION GAP: 9 (ref 5–15)
AST: 26 U/L (ref 15–41)
Albumin: 2.9 g/dL — ABNORMAL LOW (ref 3.5–5.0)
BILIRUBIN TOTAL: 0.6 mg/dL (ref 0.3–1.2)
BUN: 33 mg/dL — AB (ref 8–23)
CALCIUM: 9.2 mg/dL (ref 8.9–10.3)
CO2: 22 mmol/L (ref 22–32)
Chloride: 107 mmol/L (ref 98–111)
Creatinine, Ser: 1.35 mg/dL — ABNORMAL HIGH (ref 0.44–1.00)
GFR calc Af Amer: 40 mL/min — ABNORMAL LOW (ref >60)
GFR, EST NON AFRICAN AMERICAN: 34 mL/min — AB (ref >60)
Glucose, Bld: 360 mg/dL — ABNORMAL HIGH (ref 70–99)
POTASSIUM: 5 mmol/L (ref 3.5–5.1)
Sodium: 138 mmol/L (ref 135–145)
TOTAL PROTEIN: 7.3 g/dL (ref 6.5–8.1)

## 2018-03-04 LAB — URINALYSIS, COMPLETE (UACMP) WITH MICROSCOPIC
BILIRUBIN URINE: NEGATIVE
Bacteria, UA: NONE SEEN
Hgb urine dipstick: NEGATIVE
Ketones, ur: NEGATIVE mg/dL
Leukocytes, UA: NEGATIVE
NITRITE: NEGATIVE
PH: 5 (ref 5.0–8.0)
Protein, ur: 30 mg/dL — AB
SPECIFIC GRAVITY, URINE: 1.018 (ref 1.005–1.030)

## 2018-03-04 LAB — CBC WITH DIFFERENTIAL/PLATELET
BASOS ABS: 0.1 10*3/uL (ref 0–0.1)
BASOS PCT: 1 %
EOS PCT: 4 %
Eosinophils Absolute: 0.4 10*3/uL (ref 0–0.7)
HCT: 36.1 % (ref 35.0–47.0)
Hemoglobin: 11.9 g/dL — ABNORMAL LOW (ref 12.0–16.0)
LYMPHS PCT: 26 %
Lymphs Abs: 2.7 10*3/uL (ref 1.0–3.6)
MCH: 30.8 pg (ref 26.0–34.0)
MCHC: 32.9 g/dL (ref 32.0–36.0)
MCV: 93.5 fL (ref 80.0–100.0)
Monocytes Absolute: 0.6 10*3/uL (ref 0.2–0.9)
Monocytes Relative: 6 %
Neutro Abs: 6.7 10*3/uL — ABNORMAL HIGH (ref 1.4–6.5)
Neutrophils Relative %: 63 %
PLATELETS: 246 10*3/uL (ref 150–440)
RBC: 3.86 MIL/uL (ref 3.80–5.20)
RDW: 15.2 % — AB (ref 11.5–14.5)
WBC: 10.4 10*3/uL (ref 3.6–11.0)

## 2018-03-04 LAB — PROTIME-INR
INR: 3.01
PROTHROMBIN TIME: 31 s — AB (ref 11.4–15.2)

## 2018-03-04 LAB — GLUCOSE, CAPILLARY: Glucose-Capillary: 300 mg/dL — ABNORMAL HIGH (ref 70–99)

## 2018-03-04 MED ORDER — ONDANSETRON HCL 4 MG PO TABS: 4.0000 mg | ORAL_TABLET | Freq: Four times a day (QID) | ORAL | Status: DC | PRN

## 2018-03-04 MED ORDER — CALCIUM CARBONATE-VITAMIN D 500-200 MG-UNIT PO TABS
1.0000 | ORAL_TABLET | Freq: Every day | ORAL | Status: DC
  Administered 2018-03-05 – 2018-03-08 (×4): 1 via ORAL
  Filled 2018-03-04 (×4): qty 1

## 2018-03-04 MED ORDER — HYDRALAZINE HCL 20 MG/ML IJ SOLN: 5.0000 mg | INTRAMUSCULAR | Status: DC | PRN

## 2018-03-04 MED ORDER — INSULIN ASPART 100 UNIT/ML ~~LOC~~ SOLN
0.0000 [IU] | Freq: Every day | SUBCUTANEOUS | Status: DC
  Administered 2018-03-04: 3 [IU] via SUBCUTANEOUS
  Administered 2018-03-05: 2 [IU] via SUBCUTANEOUS
  Filled 2018-03-04 (×2): qty 1

## 2018-03-04 MED ORDER — KETOCONAZOLE 2 % EX CREA
1.0000 "application " | TOPICAL_CREAM | Freq: Two times a day (BID) | CUTANEOUS | Status: DC
  Administered 2018-03-04 – 2018-03-08 (×8): 1 via TOPICAL
  Filled 2018-03-04 (×2): qty 15

## 2018-03-04 MED ORDER — FLUCONAZOLE 100 MG PO TABS
100.0000 mg | ORAL_TABLET | Freq: Every day | ORAL | Status: DC
  Administered 2018-03-04 – 2018-03-07 (×4): 100 mg via ORAL
  Filled 2018-03-04 (×4): qty 1

## 2018-03-04 MED ORDER — ACETAMINOPHEN 325 MG PO TABS
650.0000 mg | ORAL_TABLET | Freq: Four times a day (QID) | ORAL | Status: DC | PRN
  Administered 2018-03-04 – 2018-03-08 (×6): 650 mg via ORAL
  Filled 2018-03-04 (×6): qty 2

## 2018-03-04 MED ORDER — LATANOPROST 0.005 % OP SOLN
1.0000 [drp] | Freq: Every day | OPHTHALMIC | Status: DC
  Administered 2018-03-04 – 2018-03-07 (×4): 1 [drp] via OPHTHALMIC
  Filled 2018-03-04: qty 2.5

## 2018-03-04 MED ORDER — PANTOPRAZOLE SODIUM 40 MG PO TBEC
40.0000 mg | DELAYED_RELEASE_TABLET | Freq: Every day | ORAL | Status: DC
  Administered 2018-03-05 – 2018-03-08 (×4): 40 mg via ORAL
  Filled 2018-03-04 (×4): qty 1

## 2018-03-04 MED ORDER — ADULT MULTIVITAMIN W/MINERALS CH
1.0000 | ORAL_TABLET | Freq: Every day | ORAL | Status: DC
  Administered 2018-03-05 – 2018-03-08 (×4): 1 via ORAL
  Filled 2018-03-04 (×4): qty 1

## 2018-03-04 MED ORDER — SODIUM CHLORIDE 0.9 % IV SOLN
INTRAVENOUS | Status: AC
  Administered 2018-03-04: 20:00:00 via INTRAVENOUS

## 2018-03-04 MED ORDER — ATENOLOL 25 MG PO TABS
25.0000 mg | ORAL_TABLET | Freq: Every evening | ORAL | Status: DC
  Administered 2018-03-04 – 2018-03-07 (×4): 25 mg via ORAL
  Filled 2018-03-04 (×5): qty 1

## 2018-03-04 MED ORDER — ALLOPURINOL 100 MG PO TABS
100.0000 mg | ORAL_TABLET | Freq: Every day | ORAL | Status: DC
  Administered 2018-03-05 – 2018-03-08 (×4): 100 mg via ORAL
  Filled 2018-03-04 (×4): qty 1

## 2018-03-04 MED ORDER — BISACODYL 5 MG PO TBEC
5.0000 mg | DELAYED_RELEASE_TABLET | Freq: Every day | ORAL | Status: DC | PRN
  Administered 2018-03-08: 5 mg via ORAL
  Filled 2018-03-04: qty 1

## 2018-03-04 MED ORDER — ONDANSETRON HCL 4 MG/2ML IJ SOLN: 4.0000 mg | Freq: Four times a day (QID) | INTRAMUSCULAR | Status: DC | PRN

## 2018-03-04 MED ORDER — POLYETHYLENE GLYCOL 3350 17 G PO PACK
17.0000 g | PACK | Freq: Every day | ORAL | Status: DC | PRN
  Administered 2018-03-08: 17 g via ORAL
  Filled 2018-03-04: qty 1

## 2018-03-04 MED ORDER — ASPIRIN EC 81 MG PO TBEC
81.0000 mg | DELAYED_RELEASE_TABLET | Freq: Every evening | ORAL | Status: DC
  Administered 2018-03-04 – 2018-03-07 (×4): 81 mg via ORAL
  Filled 2018-03-04 (×4): qty 1

## 2018-03-04 MED ORDER — MORPHINE SULFATE (PF) 2 MG/ML IV SOLN
1.0000 mg | INTRAVENOUS | Status: DC | PRN
  Administered 2018-03-05 – 2018-03-07 (×8): 1 mg via INTRAVENOUS
  Filled 2018-03-04 (×8): qty 1

## 2018-03-04 MED ORDER — ANASTROZOLE 1 MG PO TABS
1.0000 mg | ORAL_TABLET | Freq: Every day | ORAL | Status: DC
  Administered 2018-03-05 – 2018-03-07 (×3): 1 mg via ORAL
  Filled 2018-03-04 (×5): qty 1

## 2018-03-04 MED ORDER — ACETAMINOPHEN 650 MG RE SUPP: 650.0000 mg | Freq: Four times a day (QID) | RECTAL | Status: DC | PRN

## 2018-03-04 MED ORDER — VITAMIN D 1000 UNITS PO TABS
1000.0000 [IU] | ORAL_TABLET | Freq: Every day | ORAL | Status: DC
  Administered 2018-03-05 – 2018-03-08 (×4): 1000 [IU] via ORAL
  Filled 2018-03-04 (×4): qty 1

## 2018-03-04 MED ORDER — RIVAROXABAN 20 MG PO TABS
20.0000 mg | ORAL_TABLET | Freq: Every day | ORAL | Status: DC
  Administered 2018-03-05 – 2018-03-08 (×4): 20 mg via ORAL
  Filled 2018-03-04 (×4): qty 1

## 2018-03-04 MED ORDER — FOLIC ACID 1 MG PO TABS
1.0000 mg | ORAL_TABLET | Freq: Every day | ORAL | Status: DC
  Administered 2018-03-05 – 2018-03-08 (×4): 1 mg via ORAL
  Filled 2018-03-04 (×4): qty 1

## 2018-03-04 MED ORDER — LORAZEPAM 2 MG/ML IJ SOLN
1.0000 mg | Freq: Once | INTRAMUSCULAR | Status: AC
  Administered 2018-03-04: 1 mg via INTRAVENOUS
  Filled 2018-03-04: qty 1

## 2018-03-04 MED ORDER — INSULIN ASPART 100 UNIT/ML ~~LOC~~ SOLN
0.0000 [IU] | Freq: Three times a day (TID) | SUBCUTANEOUS | Status: DC
  Administered 2018-03-05: 3 [IU] via SUBCUTANEOUS
  Administered 2018-03-05: 5 [IU] via SUBCUTANEOUS
  Administered 2018-03-05: 3 [IU] via SUBCUTANEOUS
  Administered 2018-03-06 (×3): 2 [IU] via SUBCUTANEOUS
  Administered 2018-03-07: 3 [IU] via SUBCUTANEOUS
  Administered 2018-03-07 (×2): 2 [IU] via SUBCUTANEOUS
  Administered 2018-03-08: 3 [IU] via SUBCUTANEOUS
  Administered 2018-03-08: 2 [IU] via SUBCUTANEOUS
  Filled 2018-03-04 (×11): qty 1

## 2018-03-04 MED ORDER — VITAMIN C 500 MG PO TABS
1000.0000 mg | ORAL_TABLET | Freq: Every day | ORAL | Status: DC
  Administered 2018-03-05 – 2018-03-06 (×2): 1000 mg via ORAL
  Filled 2018-03-04 (×3): qty 2

## 2018-03-04 NOTE — ED Provider Notes (Signed)
[facility] Emergency Department Provider Note  ____________________________________________   First MD Initiated Contact with Patient 03/04/18 1452     (approximate)  I have reviewed the triage vital signs and the nursing notes.   HISTORY  Chief Complaint No chief complaint on file.   HPI [name] is a 84 y.o. female who comes to the emergency department via EMS with multiple wounds in her buttocks, axillary region, and groin.  The patient has a complicated past medical history including diabetes mellitus, hypertension, and morbid obesity.  She has been under the care of the wound clinic for multiple months attempting to treat cellulitis and candidal infections without success.  Today her symptoms became severe so she called 911.  She has gradual onset slowly progressive severe chronic pain in her low back and bilateral hips.  Worse with movement improved with rest.  She denies fevers or chills.    Past Medical History:  Diagnosis Date  . Arthritis   . Basal cell carcinoma 2019   LLE  . Breast cancer ([place]) 2014   right- radiation. Dr Pat Patrick  . Diabetes mellitus without complication ([place])   . Gastric ulcer   . Glaucoma   . Hiatal hernia   . Hypertension   . Osteoporosis   . Recurrent deep vein thrombosis (DVT) [facility])     Patient Active Problem List   Diagnosis Date Noted  . Sacral pressure ulcer 03/04/2018  . Coagulopathy ([place]) 10/23/2017  . Left knee pain 10/21/2017  . Decubitus ulcer of sacral region, stage 1 08/12/2017  . Recurrent ventral hernia 07/30/2017  . External hemorrhoids 05/19/2016  . Recurrent ventral hernia with incarceration 12/13/2015  . Abdominal hernia 12/13/2015  . Obesity (BMI 35.0-39.9 without comorbidity)   . Essential (primary) hypertension 12/04/2015  . Uncontrolled type 2 diabetes mellitus with hyperglycemia, without long-term current use of insulin ([place]) 12/04/2015  . Body mass index (BMI) of 40.0-44.9 in adult  ([place]) 11/27/2015  . Aftercare following surgery 06/20/2015  . Type 2 diabetes mellitus ([place]) 06/18/2015  . Deep vein thrombosis (DVT) of lower extremity ([place]) 06/18/2015  . Acid reflux 06/18/2015  . Arthritis urica 06/18/2015  . BP (high blood pressure) 06/18/2015  . Arthritis, degenerative 06/18/2015  . Osteoporosis, post-menopausal 06/18/2015  . Incarcerated ventral hernia 06/04/2015  . Umbilical hernia, incarcerated   . Adenocarcinoma of right breast ([place]) 12/14/2014  . Malignant neoplasm of female breast ([place]) 12/14/2014  . Carcinoma of breast ([place]) 12/14/2014  . Idiopathic localized osteoarthropathy 09/25/2011    Past Surgical History:  Procedure Laterality Date  . APPENDECTOMY    . BREAST BIOPSY Right 2014  . COLONOSCOPY  2013  . PARTIAL COLECTOMY     pre cancerous polyps/ over 10 years ago  . REPLACEMENT TOTAL KNEE    . TONSILLECTOMY    . VENTRAL HERNIA REPAIR N/A 06/04/2015   Procedure:  REPAIR of INCARCERATED VENTRAL HERNIA;  Surgeon: Marlyce Huge, MD;  Location: [facility] ORS;  Service: General;  Laterality: N/A;  . VENTRAL HERNIA REPAIR N/A 12/18/2015   Procedure: repair of recurent incarcerated ventral hernia with mesh;  Surgeon: Florene Glen, MD;  Location: [facility] ORS;  Service: General;  Laterality: N/A;    Prior to Admission medications   Medication Sig Start Date End Date Taking? Authorizing Provider  allopurinol (ZYLOPRIM) 100 MG tablet Take 100 mg by mouth daily.    Yes [provider]  anastrozole (ARIMIDEX) 1 MG tablet Take 1 mg by mouth daily.  Yes [provider]  ascorbic acid (VITAMIN C) 1000 MG tablet Take 1,000 mg by mouth daily.   Yes [provider]  aspirin EC 81 MG tablet Take 81 mg by mouth every evening.   Yes [provider]  atenolol (TENORMIN) 25 MG tablet Take 25 mg by mouth every evening.    Yes [provider]  bisacodyl (BISACODYL) 5 MG EC tablet Take 5 mg by mouth daily as needed for  moderate constipation.   Yes [provider]  Calcium Carbonate-Vitamin D (OYSTER SHELL CALCIUM 500 + D) 500-125 MG-UNIT TABS Take by mouth.   Yes [provider]  cholecalciferol (VITAMIN D) 1000 UNITS tablet Take 1,000 Units by mouth daily.   Yes [provider]  fluconazole (DIFLUCAN) 100 MG tablet Take 100 mg by mouth daily. 02/20/18 03/06/18 Yes [provider]  FOLIC ACID PO Take 1 tablet by mouth every evening.   Yes [provider]  hydrocortisone 2.5 % cream Apply 1 application topically 2 (two) times daily as needed.  12/08/17  Yes [provider]  ketoconazole (NIZORAL) 2 % cream Apply 1 application topically 2 (two) times daily. 10/24/17  Yes Fritzi Mandes, MD  latanoprost (XALATAN) 0.005 % ophthalmic solution Place 1 drop into both eyes at bedtime.    Yes [provider]  lisinopril (PRINIVIL,ZESTRIL) 30 MG tablet Take 30 mg by mouth 2 (two) times daily.   Yes [provider]  Misc Natural Products (OSTEO BI-FLEX ADV DOUBLE ST PO) Take 1 tablet by mouth daily.    Yes [provider]  Multiple Vitamin (MULTIVITAMIN WITH MINERALS) TABS tablet Take 1 tablet by mouth daily.   Yes [provider]  omeprazole (PRILOSEC) 20 MG capsule Take 20 mg by mouth daily as needed.    Yes [provider]  rivaroxaban (XARELTO) 20 MG TABS tablet Take 20 mg by mouth daily. Takes in AM   Yes [provider]  sitaGLIPtin (JANUVIA) 100 MG tablet Take 100 mg by mouth every evening.    Yes [provider]  HYDROcodone-acetaminophen (NORCO/VICODIN) 5-325 MG tablet Take 1-2 tablets by mouth every 4 (four) hours as needed for severe pain. Patient not taking: Reported on 03/04/2018 10/22/17   Vaughan Basta, MD    Allergies Metformin; Oxycodone-acetaminophen; Oxycodone-acetaminophen; and Amoxicillin  Family History  Problem Relation Age of Onset  . Diabetes Mother   . Hypertension Mother   .  Deep vein thrombosis Mother   . Heart attack Father   . Breast cancer Neg Hx     Social History Social History   Tobacco Use  . Smoking status: Never Smoker  . Smokeless tobacco: Never Used  Substance Use Topics  . Alcohol use: No    Alcohol/week: 0.0 standard drinks  . Drug use: No    Review of Systems Constitutional: No fever/chills Eyes: No visual changes. ENT: No sore throat. Cardiovascular: Denies chest pain. Respiratory: Denies shortness of breath. Gastrointestinal: No abdominal pain.  No nausea, no vomiting.  No diarrhea.  No constipation. Genitourinary: Negative for dysuria. Musculoskeletal: Positive for back pain. Skin: Positive for rash Neurological: Negative for headaches, focal weakness or numbness.   ____________________________________________   PHYSICAL EXAM:  VITAL SIGNS: ED Triage Vitals  Enc Vitals Group     BP      Pulse      Resp      Temp      Temp src      SpO2  Weight      Height      Head Circumference      Peak Flow      Pain Score      Pain Loc      Pain Edu?      Excl. in [place]?     Constitutional: Alert and oriented x4 appears extremely uncomfortable the room is full of an overwhelmingly foul smell of body odor and yeast Eyes: PERRL EOMI. Head: Atraumatic. Nose: No congestion/rhinnorhea. Mouth/Throat: No trismus Neck: No stridor.   Cardiovascular: Normal rate, regular rhythm. Grossly normal heart sounds.  Good peripheral circulation. Respiratory: Normal respiratory effort.  No retractions. Lungs CTAB and moving good air Gastrointestinal: Morbidly obese soft nontender Musculoskeletal: No lower extremity edema   Neurologic:  Normal speech and language. No gross focal neurologic deficits are appreciated. Skin: The patient has a large amount of intertrigo in her groin under her multiple abdominal skin folds as well as her axillary skin folds.  She has a decubitus ulcer.  These wounds appear to be superinfected with bacteria and  [facility]: Mood and affect are normal. Speech and behavior are normal.    ____________________________________________   DIFFERENTIAL includes but not limited to  Intertrigo, candidal infection, decubitus ulcer, necrotizing soft tissue infection, cellulitis ____________________________________________   LABS (all labs ordered are listed, but only abnormal results are displayed)  Labs Reviewed  COMPREHENSIVE METABOLIC PANEL - Abnormal; Notable for the following components:      Result Value   Glucose, Bld 360 (*)    BUN 33 (*)    Creatinine, Ser 1.35 (*)    Albumin 2.9 (*)    GFR calc non Af Amer 34 (*)    GFR calc Af Amer 40 (*)    All other components within normal limits  CBC WITH DIFFERENTIAL/PLATELET - Abnormal; Notable for the following components:   Hemoglobin 11.9 (*)    RDW 15.2 (*)    Neutro Abs 6.7 (*)    All other components within normal limits  PROTIME-INR - Abnormal; Notable for the following components:   Prothrombin Time 31.0 (*)    All other components within normal limits  URINALYSIS, COMPLETE (UACMP) WITH MICROSCOPIC - Abnormal; Notable for the following components:   Color, Urine YELLOW (*)    APPearance CLEAR (*)    Glucose, UA >=500 (*)    Protein, ur 30 (*)    All other components within normal limits  BASIC METABOLIC PANEL - Abnormal; Notable for the following components:   Glucose, Bld 271 (*)    BUN 30 (*)    Creatinine, Ser 1.20 (*)    GFR calc non Af Amer 40 (*)    GFR calc Af Amer 46 (*)    All other components within normal limits  CBC - Abnormal; Notable for the following components:   RBC 3.58 (*)    Hemoglobin 11.4 (*)    HCT 33.3 (*)    RDW 14.8 (*)    All other components within normal limits  HEMOGLOBIN A1C - Abnormal; Notable for the following components:   Hgb A1c MFr Bld 10.9 (*)    All other components within normal limits  GLUCOSE, CAPILLARY - Abnormal; Notable for the following components:   Glucose-Capillary  300 (*)    All other components within normal limits  GLUCOSE, CAPILLARY - Abnormal; Notable for the following components:   Glucose-Capillary 237 (*)    All other components within normal limits  GLUCOSE, CAPILLARY - Abnormal; Notable  for the following components:   Glucose-Capillary 272 (*)    All other components within normal limits  GLUCOSE, CAPILLARY - Abnormal; Notable for the following components:   Glucose-Capillary 293 (*)    All other components within normal limits  GLUCOSE, CAPILLARY - Abnormal; Notable for the following components:   Glucose-Capillary 229 (*)    All other components within normal limits  GLUCOSE, CAPILLARY - Abnormal; Notable for the following components:   Glucose-Capillary 193 (*)    All other components within normal limits  GLUCOSE, CAPILLARY - Abnormal; Notable for the following components:   Glucose-Capillary 188 (*)    All other components within normal limits    Lab work reviewed by me with a number of abnormalities most notably low albumin consistent with poor nutrition.  Elevated sugar but no signs of DKA __________________________________________  EKG   ____________________________________________  RADIOLOGY   ____________________________________________   PROCEDURES  Procedure(s) performed: no  Procedures  Critical Care performed: no  ____________________________________________   INITIAL IMPRESSION / ASSESSMENT AND PLAN / ED COURSE  Pertinent labs & imaging results that were available during my care of the patient were reviewed by me and considered in my medical decision making (see chart for details).   As part of my medical decision making, I reviewed the following data within the [place] History obtained from family if available, nursing notes, old chart and ekg, as well as notes from prior ED visits.  The patient arrives with severe intertrigo and candidal superinfection.  She has attempted  outpatient management for multiple months and she is clearly failed.  At this point she requires inpatient admission for aggressive around-the-clock wound care and possibly infectious disease consultation for treatment of her candidal infection as well as bacterial superinfection.  I have discussed with the family and the patient who understand and agree with the plan.  I then discussed with the hospitalist who has graciously agreed to admit the patient to her service.      ____________________________________________   FINAL CLINICAL IMPRESSION(S) / ED DIAGNOSES  Final diagnoses:  Intertrigo  Fungal infection      NEW MEDICATIONS STARTED DURING THIS VISIT:  Current Discharge Medication List       Note:  This document was prepared using Dragon voice recognition software and may include unintentional dictation errors.     Darel Hong, MD 03/06/18 [phone]

## 2018-03-04 NOTE — H&P (Addendum)
[facility] NAME: [name]    MR#:  [id number]  DATE OF BIRTH:  [birth year]  DATE OF ADMISSION:  03/04/2018  PRIMARY CARE PHYSICIAN: Adin Hector, MD   REQUESTING/REFERRING PHYSICIAN: Darel Hong, MD  CHIEF COMPLAINT:  Groin wounds  HISTORY OF PRESENT ILLNESS:  [name]  is a 84 y.o. female with a known history of hypertension, type 2 diabetes, osteoporosis, recurrent DVT (on xarelto), hx right breast cancer, and morbid obesity who presented to the ED with worsening sacral pressure ulcers and groin infection. She has been following with the wound clinic. She has been on three 14 day courses of diflucan. Her husband has also been putting ketoconazole under her breasts. Her sacral wounds have been bleeding recently. She spends all day in a recliner at home. She can take 5 steps maximum. She uses a bedside commode. She denies any fevers or chills at home. Hospitalists were called for admission for failed outpatient wound care.  PAST MEDICAL HISTORY:   Past Medical History:  Diagnosis Date  . Arthritis   . Basal cell carcinoma 2019   LLE  . Breast cancer ([place]) 2014   right- radiation. Dr Pat Patrick  . Diabetes mellitus without complication ([place])   . Gastric ulcer   . Glaucoma   . Hiatal hernia   . Hypertension   . Osteoporosis   . Recurrent deep vein thrombosis (DVT) ([place])     PAST SURGICAL HISTORY:   Past Surgical History:  Procedure Laterality Date  . APPENDECTOMY    . BREAST BIOPSY Right 2014  . COLONOSCOPY  2013  . PARTIAL COLECTOMY     pre cancerous polyps/ over 10 years ago  . REPLACEMENT TOTAL KNEE    . TONSILLECTOMY    . VENTRAL HERNIA REPAIR N/A 06/04/2015   Procedure:  REPAIR of INCARCERATED VENTRAL HERNIA;  Surgeon: Marlyce Huge, MD;  Location: [facility] ORS;  Service: General;  Laterality: N/A;  . VENTRAL HERNIA REPAIR N/A 12/18/2015   Procedure: repair of recurent incarcerated ventral hernia with  mesh;  Surgeon: Florene Glen, MD;  Location: [facility] ORS;  Service: General;  Laterality: N/A;    SOCIAL HISTORY:   Social History   Tobacco Use  . Smoking status: Never Smoker  . Smokeless tobacco: Never Used  Substance Use Topics  . Alcohol use: No    Alcohol/week: 0.0 standard drinks    FAMILY HISTORY:   Family History  Problem Relation Age of Onset  . Diabetes Mother   . Hypertension Mother   . Deep vein thrombosis Mother   . Heart attack Father   . Breast cancer Neg Hx     DRUG ALLERGIES:   Allergies  Allergen Reactions  . Metformin Diarrhea  . Oxycodone-Acetaminophen Nausea And Vomiting  . Oxycodone-Acetaminophen Nausea Only  . Amoxicillin Rash and Other (See Comments)    Has patient had a PCN reaction causing immediate rash, facial/tongue/throat swelling, SOB or lightheadedness with hypotension: No Has patient had a PCN reaction causing severe rash involving mucus membranes or skin necrosis: No Has patient had a PCN reaction that required hospitalization No Has patient had a PCN reaction occurring within the last 10 years: No If all of the above answers are "NO", then may proceed with Cephalosporin use.    REVIEW OF SYSTEMS:   Review of Systems  Constitutional: Negative for chills and fever.  HENT: Negative for congestion and sore throat.   Eyes: Negative for  blurred vision and double vision.  Respiratory: Negative for cough and shortness of breath.   Cardiovascular: Positive for leg swelling. Negative for chest pain and palpitations.  Gastrointestinal: Negative for abdominal pain, nausea and vomiting.  Genitourinary: Negative for dysuria and frequency.  Musculoskeletal: Negative for back pain and neck pain.  Skin: Positive for rash.  Neurological: Negative for dizziness and headaches.  Psychiatric/Behavioral: Negative for depression. The patient is not nervous/anxious.      MEDICATIONS AT HOME:   Prior to Admission medications   Medication Sig  Start Date End Date Taking? Authorizing Provider  allopurinol (ZYLOPRIM) 100 MG tablet Take 100 mg by mouth daily.    Yes [provider]  anastrozole (ARIMIDEX) 1 MG tablet Take 1 mg by mouth daily.   Yes [provider]  ascorbic acid (VITAMIN C) 1000 MG tablet Take 1,000 mg by mouth daily.   Yes [provider]  aspirin EC 81 MG tablet Take 81 mg by mouth every evening.   Yes [provider]  atenolol (TENORMIN) 25 MG tablet Take 25 mg by mouth every evening.    Yes [provider]  bisacodyl (BISACODYL) 5 MG EC tablet Take 5 mg by mouth daily as needed for moderate constipation.   Yes [provider]  Calcium Carbonate-Vitamin D (OYSTER SHELL CALCIUM 500 + D) 500-125 MG-UNIT TABS Take by mouth.   Yes [provider]  cholecalciferol (VITAMIN D) 1000 UNITS tablet Take 1,000 Units by mouth daily.   Yes [provider]  fluconazole (DIFLUCAN) 100 MG tablet Take 100 mg by mouth daily. 02/20/18 03/06/18 Yes [provider]  FOLIC ACID PO Take 1 tablet by mouth every evening.   Yes [provider]  hydrocortisone 2.5 % cream Apply 1 application topically 2 (two) times daily as needed.  12/08/17  Yes [provider]  ketoconazole (NIZORAL) 2 % cream Apply 1 application topically 2 (two) times daily. 10/24/17  Yes Fritzi Mandes, MD  latanoprost (XALATAN) 0.005 % ophthalmic solution Place 1 drop into both eyes at bedtime.    Yes [provider]  lisinopril (PRINIVIL,ZESTRIL) 30 MG tablet Take 30 mg by mouth 2 (two) times daily.   Yes [provider]  Misc Natural Products (OSTEO BI-FLEX ADV DOUBLE ST PO) Take 1 tablet by mouth daily.    Yes [provider]  Multiple Vitamin (MULTIVITAMIN WITH MINERALS) TABS tablet Take 1 tablet by mouth daily.   Yes [provider]  omeprazole (PRILOSEC) 20 MG capsule Take 20 mg by mouth daily as needed.    Yes [provider]    sitaGLIPtin (JANUVIA) 100 MG tablet Take 100 mg by mouth every evening.    Yes [provider]  HYDROcodone-acetaminophen (NORCO/VICODIN) 5-325 MG tablet Take 1-2 tablets by mouth every 4 (four) hours as needed for severe pain. Patient not taking: Reported on 03/04/2018 10/22/17   Vaughan Basta, MD  warfarin (COUMADIN) 5 MG tablet Take 1 tablet (5 mg total) by mouth daily at 6 PM. INR is high, so start after 3 days. Patient not taking: Reported on 03/04/2018 10/24/17   Fritzi Mandes, MD      VITAL SIGNS:  Blood pressure (!) 158/69, pulse (!) 104, resp. rate 17, height 5\' 4"  (1.626 m), weight 113 kg, SpO2 99 %.  PHYSICAL EXAMINATION:  Physical Exam  GENERAL:  84 y.o.-year-old morbidly obese patient lying in the bed, occasionally moaning. Alert. EYES: Pupils equal, round, reactive to light and accommodation. No scleral icterus. Extraocular  muscles intact.  HEENT: Head atraumatic, normocephalic. Oropharynx and nasopharynx clear.  NECK:  Supple, no jugular venous distention. No thyroid enlargement, no tenderness.  LUNGS: diminished breath sounds throughout all lung fields, no wheezing, rales,rhonchi or crepitation. No use of accessory muscles of respiration.  CARDIOVASCULAR: RRR, S1, S2 normal. No murmurs, rubs, or gallops.  ABDOMEN: Soft, nontender, nondistended. Bowel sounds present. No organomegaly or mass.  GENITOURINARY: foley in place EXTREMITIES: no edema, cyanosis, or clubbing.  NEUROLOGIC: Cranial nerves II through XII are intact. +global weakness. Sensation intact. Gait not checked.  PSYCHIATRIC: The patient is alert and oriented x 3.  SKIN: 4 sacral pressure ulcers present on buttocks without purulent drainage; moisture, erythema, and skin breakdown present underneath both breasts; skin breakdown and moisture present in the groin bilaterally.   LABORATORY PANEL:   CBC Recent Labs  Lab 03/04/18 1459  WBC 10.4  HGB 11.9*  HCT 36.1  PLT 246    ------------------------------------------------------------------------------------------------------------------  Chemistries  Recent Labs  Lab 03/04/18 1459  NA 138  K 5.0  CL 107  CO2 22  GLUCOSE 360*  BUN 33*  CREATININE 1.35*  CALCIUM 9.2  AST 26  ALT 15  ALKPHOS 90  BILITOT 0.6   ------------------------------------------------------------------------------------------------------------------  Cardiac Enzymes No results for input(s): TROPONINI in the last 168 hours. ------------------------------------------------------------------------------------------------------------------  RADIOLOGY:  No results found.    IMPRESSION AND PLAN:   Stage II sacral pressure ulcers- 4 present on the bottom, no signs of bacterial infection. - wound care consult - q2hr turns - nutrition consult - tylenol and morphine for pain  Candidal skin infection- present underneath both breasts and in the groin area. No signs of secondary bacterial infection. - hold on topical nystatin due to skin breakdown - continue diflucan 100mg  daily - continue topical ketoconazole underneath breasts - wound care consult  Immobility- requiring near total care at home. Spends all day in a recliner. Can take a few steps to bedside commode. - PT/OT consult - plan for discharge to SNF  Elevated creatinine- not quite meeting criteria for AKI. Cr 1.35 in the ED (baseline 0.8-1.2) - hold lisinopril - gentle IVFs x 24 hours  Hypertension- normotensive in the ED - holding lisinopril - continue atenolol - hydralazine IV prn  Type 2 diabetes- on januvia at home - check A1c - sensitive SSI  Recurrent DVTs- no signs of DVT on exam - continue home xarelto  History of gout- no acute flare - continue allopurinol   History of right breast cancer- s/p radiation - continue home anastrozole  History of osteoporosis - continue calcium and vitamin D  All the records are reviewed and case  discussed with ED provider. Management plans discussed with the patient, family and they are in agreement.  CODE STATUS: FULL  TOTAL TIME TAKING CARE OF THIS PATIENT: 40 minutes.    Berna Spare Mayo M.D on 03/04/2018 at 4:38 PM  Between 7am to 6pm - Pager - [phone]  After 6pm go to [email or web address] - password [email or web address] [facility]  [employer] [facility]  Office  [phone]  CC: Primary care physician; Adin Hector, MD   Note: This dictation was prepared with Dragon dictation along with smaller phrase technology. Any transcriptional errors that result from this process are unintentional.

## 2018-03-04 NOTE — Progress Notes (Signed)
Family Meeting Note  Advance Directive:yes  Today a meeting took place with the Patient and spouse.  Patient is able to participate.  The following clinical team members were present during this meeting:MD  The following were discussed:Patient's diagnosis: , Patient's progosis: Unable to determine and Goals for treatment: Continue present management and full code  Additional follow-up to be provided: prn  Time spent during discussion:20 minutes  Evette Doffing, MD

## 2018-03-04 NOTE — Progress Notes (Signed)
Pharmacy Note  Spoke with pt and pt's husband, who provides most of the information. Pt's husband states pt was taken off of warfarin at least a month ago due to drug interaction with fluconazole (INR elevated, difficulty regulating) and has been taking Xarelto 20 mg daily in the AM, she already had her dose this AM.

## 2018-03-05 LAB — BASIC METABOLIC PANEL
ANION GAP: 7 (ref 5–15)
BUN: 30 mg/dL — ABNORMAL HIGH (ref 8–23)
CHLORIDE: 109 mmol/L (ref 98–111)
CO2: 25 mmol/L (ref 22–32)
Calcium: 8.9 mg/dL (ref 8.9–10.3)
Creatinine, Ser: 1.2 mg/dL — ABNORMAL HIGH (ref 0.44–1.00)
GFR calc Af Amer: 46 mL/min — ABNORMAL LOW (ref >60)
GFR calc non Af Amer: 40 mL/min — ABNORMAL LOW (ref >60)
Glucose, Bld: 271 mg/dL — ABNORMAL HIGH (ref 70–99)
POTASSIUM: 4.4 mmol/L (ref 3.5–5.1)
Sodium: 141 mmol/L (ref 135–145)

## 2018-03-05 LAB — GLUCOSE, CAPILLARY
GLUCOSE-CAPILLARY: 237 mg/dL — AB (ref 70–99)
GLUCOSE-CAPILLARY: 272 mg/dL — AB (ref 70–99)
Glucose-Capillary: 293 mg/dL — ABNORMAL HIGH (ref 70–99)
Glucose-Capillary: 229 mg/dL — ABNORMAL HIGH (ref 70–99)

## 2018-03-05 LAB — CBC
HCT: 33.3 % — ABNORMAL LOW (ref 35.0–47.0)
HEMOGLOBIN: 11.4 g/dL — AB (ref 12.0–16.0)
MCH: 31.7 pg (ref 26.0–34.0)
MCHC: 34.1 g/dL (ref 32.0–36.0)
MCV: 92.9 fL (ref 80.0–100.0)
Platelets: 241 10*3/uL (ref 150–440)
RBC: 3.58 MIL/uL — ABNORMAL LOW (ref 3.80–5.20)
RDW: 14.8 % — ABNORMAL HIGH (ref 11.5–14.5)
WBC: 7.8 10*3/uL (ref 3.6–11.0)

## 2018-03-05 LAB — HEMOGLOBIN A1C
Hgb A1c MFr Bld: 10.9 % — ABNORMAL HIGH (ref 4.8–5.6)
Mean Plasma Glucose: 266.13 mg/dL

## 2018-03-05 MED ORDER — LINAGLIPTIN 5 MG PO TABS
5.0000 mg | ORAL_TABLET | Freq: Every day | ORAL | Status: DC
  Administered 2018-03-05 – 2018-03-08 (×3): 5 mg via ORAL
  Filled 2018-03-05 (×4): qty 1

## 2018-03-05 MED ORDER — LISINOPRIL 20 MG PO TABS
20.0000 mg | ORAL_TABLET | Freq: Two times a day (BID) | ORAL | Status: DC
  Administered 2018-03-05 – 2018-03-08 (×6): 20 mg via ORAL
  Filled 2018-03-05 (×6): qty 1

## 2018-03-05 MED ORDER — NYSTATIN 100000 UNIT/GM EX POWD
Freq: Two times a day (BID) | CUTANEOUS | Status: DC
  Administered 2018-03-05 – 2018-03-08 (×7): via TOPICAL
  Filled 2018-03-05 (×2): qty 15

## 2018-03-05 NOTE — Clinical Social Work Note (Signed)
CSW received a consult for SNF placement. CSW will visit the family to assess when able.  Santiago Bumpers, MSW, Latanya Presser [phone]

## 2018-03-05 NOTE — NC FL2 (Signed)
[place] LEVEL OF CARE SCREENING TOOL     IDENTIFICATION  Patient Name: [name] Birthdate: December 14, 1928 Sex: female Admission Date (Current Location): 03/04/2018  [place] and [place] Number:  Engineering geologist and Address:  [facility], [address], [place],  [address]      Provider Number: [id number]  Attending Physician Name and Address:  Fritzi Mandes, MD  Relative Name and Phone Number:  Elfida Shimada (Spouse) [phone]    Current Level of Care: Hospital Recommended Level of Care: [place] Prior Approval Number:    Date Approved/Denied:   PASRR Number: [id number]  Discharge Plan: SNF    Current Diagnoses: Patient Active Problem List   Diagnosis Date Noted  . Sacral pressure ulcer 03/04/2018  . Coagulopathy ([place]) 10/23/2017  . Left knee pain 10/21/2017  . Decubitus ulcer of sacral region, stage 1 08/12/2017  . Recurrent ventral hernia 07/30/2017  . External hemorrhoids 05/19/2016  . Recurrent ventral hernia with incarceration 12/13/2015  . Abdominal hernia 12/13/2015  . Obesity (BMI 35.0-39.9 without comorbidity)   . Essential (primary) hypertension 12/04/2015  . Uncontrolled type 2 diabetes mellitus with hyperglycemia, without long-term current use of insulin ([place]) 12/04/2015  . Body mass index (BMI) of 40.0-44.9 in adult ([place]) 11/27/2015  . Aftercare following surgery 06/20/2015  . Type 2 diabetes mellitus ([place]) 06/18/2015  . Deep vein thrombosis (DVT) of lower extremity ([place]) 06/18/2015  . Acid reflux 06/18/2015  . Arthritis urica 06/18/2015  . BP (high blood pressure) 06/18/2015  . Arthritis, degenerative 06/18/2015  . Osteoporosis, post-menopausal 06/18/2015  . Incarcerated ventral hernia 06/04/2015  . Umbilical hernia, incarcerated   . Adenocarcinoma of right breast ([place]) 12/14/2014  . Malignant neoplasm of female breast ([place]) 12/14/2014  . Carcinoma of breast ([place]) 12/14/2014  .  Idiopathic localized osteoarthropathy 09/25/2011    Orientation RESPIRATION BLADDER Height & Weight     Self, Time, Situation, Place  Normal Incontinent Weight: 249 lb 1.9 oz (113 kg) Height:  5\' 4"  (162.6 cm)  BEHAVIORAL SYMPTOMS/MOOD NEUROLOGICAL BOWEL NUTRITION STATUS      Incontinent Diet(Heart healthy/Carb modified)  AMBULATORY STATUS COMMUNICATION OF NEEDS Skin   Total Care Verbally PU Stage and Appropriate Care   PU Stage 2 Dressing: BID                   Personal Care Assistance Level of Assistance  Bathing, Feeding, Dressing Bathing Assistance: Maximum assistance Feeding assistance: Independent Dressing Assistance: Maximum assistance     Functional Limitations Info  Sight, Hearing, Speech Sight Info: Adequate Hearing Info: Adequate Speech Info: Adequate    SPECIAL CARE FACTORS FREQUENCY  PT (By licensed PT), OT (By licensed OT)     PT Frequency: Up to 5X per week OT Frequency: Up to 5X per week            Contractures Contractures Info: Not present    Additional Factors Info  Code Status, Allergies Code Status Info: Full Allergies Info: Metformin, Oxycodone-acetaminophen, Oxycodone-acetaminophen, Amoxicillin           Current Medications (03/05/2018):  This is the current hospital active medication list Current Facility-Administered Medications  Medication Dose Route Frequency Provider Last Rate Last Dose  . 0.9 %  sodium chloride infusion   Intravenous Continuous Mayo, Pete Pelt, MD 75 mL/hr at 03/04/18 2000    . acetaminophen (TYLENOL) tablet 650 mg  650 mg Oral Q6H PRN Sela Hua, MD   650 mg at 03/05/18  1224   Or  . acetaminophen (TYLENOL) suppository 650 mg  650 mg Rectal Q6H PRN Mayo, Pete Pelt, MD      . allopurinol (ZYLOPRIM) tablet 100 mg  100 mg Oral Daily Mayo, Pete Pelt, MD   100 mg at 03/05/18 0839  . anastrozole (ARIMIDEX) tablet 1 mg  1 mg Oral Daily Mayo, Pete Pelt, MD   1 mg at 03/05/18 0853  . aspirin EC tablet 81 mg  81 mg  Oral QPM Mayo, Pete Pelt, MD   81 mg at 03/04/18 2214  . atenolol (TENORMIN) tablet 25 mg  25 mg Oral QPM Mayo, Pete Pelt, MD   25 mg at 03/04/18 2215  . bisacodyl (DULCOLAX) EC tablet 5 mg  5 mg Oral Daily PRN Mayo, Pete Pelt, MD      . calcium-vitamin D (OSCAL WITH D) 500-200 MG-UNIT per tablet 1 tablet  1 tablet Oral Daily Mayo, Pete Pelt, MD   1 tablet at 03/05/18 0840  . cholecalciferol (VITAMIN D) tablet 1,000 Units  1,000 Units Oral Daily Mayo, Pete Pelt, MD   1,000 Units at 03/05/18 [phone]  . fluconazole (DIFLUCAN) tablet 100 mg  100 mg Oral Daily Mayo, Pete Pelt, MD   100 mg at 03/04/18 2240  . folic acid (FOLVITE) tablet 1 mg  1 mg Oral Daily Mayo, Pete Pelt, MD   1 mg at 03/05/18 0840  . hydrALAZINE (APRESOLINE) injection 5 mg  5 mg Intravenous Q4H PRN Mayo, Pete Pelt, MD      . insulin aspart (novoLOG) injection 0-5 Units  0-5 Units Subcutaneous QHS Sela Hua, MD   3 Units at 03/04/18 2215  . insulin aspart (novoLOG) injection 0-9 Units  0-9 Units Subcutaneous TID WC Mayo, Pete Pelt, MD   3 Units at 03/05/18 1155  . ketoconazole (NIZORAL) 2 % cream 1 application  1 application Topical BID Mayo, Pete Pelt, MD   1 application at 47/82/95 0840  . latanoprost (XALATAN) 0.005 % ophthalmic solution 1 drop  1 drop Both Eyes QHS Mayo, Pete Pelt, MD   1 drop at 03/04/18 2216  . linagliptin (TRADJENTA) tablet 5 mg  5 mg Oral Daily Fritzi Mandes, MD      . lisinopril (PRINIVIL,ZESTRIL) tablet 20 mg  20 mg Oral BID Fritzi Mandes, MD      . morphine 2 MG/ML injection 1 mg  1 mg Intravenous Q2H PRN Mayo, Pete Pelt, MD   1 mg at 03/05/18 0411  . multivitamin with minerals tablet 1 tablet  1 tablet Oral Daily Mayo, Pete Pelt, MD   1 tablet at 03/05/18 [phone]  . nystatin (MYCOSTATIN/NYSTOP) topical powder   Topical BID Fritzi Mandes, MD      . ondansetron [facility]) tablet 4 mg  4 mg Oral Q6H PRN Mayo, Pete Pelt, MD       Or  . ondansetron [facility]) injection 4 mg  4 mg Intravenous Q6H PRN Mayo, Pete Pelt, MD       . pantoprazole (PROTONIX) EC tablet 40 mg  40 mg Oral Daily Mayo, Pete Pelt, MD   40 mg at 03/05/18 0839  . polyethylene glycol (MIRALAX / GLYCOLAX) packet 17 g  17 g Oral Daily PRN Mayo, Pete Pelt, MD      . rivaroxaban [facility]) tablet 20 mg  20 mg Oral Daily Mayo, Pete Pelt, MD   20 mg at 03/05/18 [phone]  . vitamin C (ASCORBIC ACID) tablet 1,000 mg  1,000 mg Oral Daily Mayo, Pete Pelt,  MD   1,000 mg at 03/05/18 5183     Discharge Medications: Please see discharge summary for a list of discharge medications.  Relevant Imaging Results:  Relevant Lab Results:   Additional Information SS#  Zettie Pho, LCSW

## 2018-03-05 NOTE — Clinical Social Work Note (Signed)
Clinical Social Work Assessment  Patient Details  Name: [name] MRN: [id number] Date of Birth: [birth year]  Date of referral:  03/05/18               Reason for consult:  Facility Placement                Permission sought to share information with:  Chartered certified accountant granted to share information::  Yes, Verbal Permission Granted  Name::        Agency::  [facility]  Relationship::     Contact Information:     Housing/Transportation Living arrangements for the past 2 months:  [place] of Information:  Patient, Medical Team, Spouse Patient Interpreter Needed:  None Criminal Activity/Legal Involvement Pertinent to Current Situation/Hospitalization:  No - Comment as needed Significant Relationships:  Adult Children, Warehouse manager, Spouse Lives with:  Spouse Do you feel safe going back to the place where you live?  Yes Need for family participation in patient care:  No (Coment)  Care giving concerns:  OT recommendation for SNF; Family requesting placement due to wound care needs   Social Worker assessment / plan:  The CSW met with the patient and her spouse at bedside to discuss discharge planning. The patient and her husband verbalized consent to begin SNF referral process. The CSW explained how Medicare covers SNF.  The patient's spouse is very involved in her care and is knowledgeable about her medical history. His main concern is that the pressure wounds had begun to heal, but have now deteriorated. The patient and her spouse indicated that they plan on her return to the home once her wounds have healed sufficiently so that her husband can return to caring for them.  The CSW has begun the referral process. The patient will most likely discharge early next week. The CSW will follow-up with bed offers and follow for discharge facilitation.   Employment status:  Retired Forensic scientist:  Medicare PT  Recommendations:  Not assessed at this time [place] / Referral to community resources:  [place]  Patient/Family's Response to care:  The patient and her spouse were friendly and thanked the CSW.  Patient/Family's Understanding of and Emotional Response to Diagnosis, Current Treatment, and Prognosis:  The patient and her spouse understand that her care needs have elevated, and they want her to have adequate wound care in a SNF.  Emotional Assessment Appearance:  Appears younger than stated age Attitude/Demeanor/Rapport:  Gracious, Engaged Affect (typically observed):  Appropriate, Pleasant Orientation:  Oriented to Self, Oriented to Place, Oriented to  Time, Oriented to Situation Alcohol / Substance use:  Never Used Psych involvement (Current and /or in the community):  No (Comment)  Discharge Needs  Concerns to be addressed:  Care Coordination, Discharge Planning Concerns Readmission within the last 30 days:  No Current discharge risk:  Chronically ill, Physical Impairment Barriers to Discharge:  Continued Medical Work up   [employer], LCSW 03/05/2018, 3:25 PM

## 2018-03-05 NOTE — Evaluation (Signed)
Occupational Therapy Evaluation Patient Details Name: [name] MRN: [id number] DOB: Dec 18, 1928 Today's Date: 03/05/2018    History of Present Illness Pt. is an 84 y.o. female who was admitted to [facility] for care of multiple sacral pressure wounds. Pt. PMHx includes: HTN, TypeII DM, Osteoporosis, Recurrent DVT, Right Breast CA, Morbid Obesity,    Clinical Impression   Pt. presents with weakness, limited activity tolerance, multiple sacral wounds, and limited functional mobility which limits her ability to complete basic ADL and IADL functioning. Pt. resides at home with her husband. Pt. required assist with ADLs, and IADL tasks from her husband . Pt. required assist with meal preparation, medication management, and performing home management, and household tasks. Pt. has home health care services 3 x's a week for 4 hours each day. Pt. spends all day in, and sleeps in a standard recliner. Pt. does transfer to the commode with assist. Pt. has a hospital bed, a chair lift, and a lift chair recliner. Pt.'s husband reports that the pt. Only uses the standard recliner. Pt. Education was provided about general reacher use, as well as opportunities to engage her UEs in ADL tasks. Pt. Could benefit from OT services for ADL training,  There. Ex.,A/E training, and pt. education about home modification, and DME. Pt. would benefitt from SNF level of care upon discharge. Pt. Could benefit from follow-up OT services at discharge.    Follow Up Recommendations  SNF    Equipment Recommendations       Recommendations for Other Services       Precautions / Restrictions Restrictions Weight Bearing Restrictions: No      Mobility Bed Mobility    Deferred              Transfers    Deferred                    Balance                                           ADL either performed or assessed with clinical judgement   ADL Overall ADL's : Needs  assistance/impaired Eating/Feeding: Set up;Supervision/ safety   Grooming: Set up;Minimal assistance   Upper Body Bathing: Set up;Maximal assistance   Lower Body Bathing: Set up;Total assistance   Upper Body Dressing : Set up;Total assistance   Lower Body Dressing: Set up;Total assistance                 General ADL Comments: Pt. education was provided about general reacher use.      Vision Baseline Vision/History: Wears glasses Wears Glasses: At all times Patient Visual Report: No change from baseline       Perception     Praxis      Pertinent Vitals/Pain Pain Assessment: 0-10 Pain Intervention(s): Premedicated before session     Hand Dominance Right   Extremity/Trunk Assessment             Communication Communication Communication: No difficulties   Cognition Arousal/Alertness: Awake/alert Behavior During Therapy: WFL for tasks assessed/performed Overall Cognitive Status: Difficult to assess                                     General Comments       Exercises     Shoulder Instructions  Home Living Family/patient expects to be discharged to:: Skilled nursing facility Living Arrangements: Spouse/significant other                               Additional Comments: [place] aide comes 3x weekly for 4 hours each      Prior Functioning/Environment Level of Independence: Independent with assistive device(s)        Comments: Pt. is total care with bathing dressing, Pt. was able to transfer to the [place]. Pt. was able to feed self, and assist with light grooming tasks. Pt. spends most of the day, and sleeps in a recliner.         OT Problem List: Decreased strength;Decreased activity tolerance;Decreased knowledge of use of DME or AE      OT Treatment/Interventions: Self-care/ADL training;Therapeutic exercise;Patient/family education;DME and/or AE instruction;Therapeutic activities    OT Goals(Current goals can  be found in the care plan section) Acute Rehab OT Goals Patient Stated Goal: To return home OT Goal Formulation: With patient Potential to Achieve Goals: Good  OT Frequency: Min 1X/week   Barriers to D/C:            Co-evaluation              AM-PAC PT "6 Clicks" Daily Activity     Outcome Measure Help from another person eating meals?: A Little Help from another person taking care of personal grooming?: A Little Help from another person toileting, which includes using toliet, bedpan, or urinal?: Total Help from another person bathing (including washing, rinsing, drying)?: Total Help from another person to put on and taking off regular upper body clothing?: A Lot Help from another person to put on and taking off regular lower body clothing?: Total 6 Click Score: 11   End of Session    Activity Tolerance: Patient tolerated treatment well Patient left: in bed;with call bell/phone within reach;with bed alarm set;with family/visitor present  OT Visit Diagnosis: Muscle weakness (generalized) (M62.81)                Time: 8638-1771 OT Time Calculation (min): 25 min Charges:  OT General Charges $OT Visit: 1 Visit OT Evaluation $OT Eval Moderate Complexity: 1 Mod  Harrel Carina, MS, OTR/L  Harrel Carina 03/05/2018, 11:58 AM

## 2018-03-05 NOTE — Progress Notes (Signed)
PT Cancellation Note  Patient Details Name: [name] MRN: [id number] DOB: 06/02/1929   Cancelled Treatment:    Reason Eval/Treat Not Completed: Patient declined, no reason specified.  Patient refused, stating that this is "not the time" and that she feels too nauseated to get out of bed.  Will re-attempt later if time allows.   Roxanne Gates, PT, DPT 03/05/2018, 11:38 AM

## 2018-03-06 LAB — GLUCOSE, CAPILLARY
GLUCOSE-CAPILLARY: 193 mg/dL — AB (ref 70–99)
Glucose-Capillary: 188 mg/dL — ABNORMAL HIGH (ref 70–99)
Glucose-Capillary: 169 mg/dL — ABNORMAL HIGH (ref 70–99)
Glucose-Capillary: 181 mg/dL — ABNORMAL HIGH (ref 70–99)

## 2018-03-06 MED ORDER — PREMIER PROTEIN SHAKE
11.0000 [oz_av] | Freq: Two times a day (BID) | ORAL | Status: DC
  Administered 2018-03-06 – 2018-03-08 (×3): 11 [oz_av] via ORAL

## 2018-03-06 MED ORDER — OCUVITE-LUTEIN PO CAPS
1.0000 | ORAL_CAPSULE | Freq: Every day | ORAL | Status: DC
  Administered 2018-03-06 – 2018-03-07 (×2): 1 via ORAL
  Filled 2018-03-06 (×3): qty 1

## 2018-03-06 NOTE — Evaluation (Signed)
Physical Therapy Evaluation Patient Details Name: [name] MRN: [id number] DOB: [birth year] Today's Date: 03/06/2018   History of Present Illness  Pt. is an 84 y.o. female who was admitted to [facility] for care of multiple sacral pressure wounds. Pt. PMHx includes: HTN, TypeII DM, Osteoporosis, Recurrent DVT, Right Breast CA, Morbid Obesity,   Clinical Impression  Pt is a pleasant 84 year old female who was admitted for sacral pressure ulcers and wound care. Pt performs bed mobility including rolling with max assist. Pt unable to perform further OOB mobility at this time. Pt demonstrates deficits with strength/endurance/mobility/cognition. Pt very confused and unsure why therapy is in room despite reorientation. B UE>B LE. Pt at baseline is able to ambulate/transfer short distances without AD with assist from family. Pt is unable/unsafe to perform these tasks at this time. Currently not at baseline level.  Would benefit from skilled PT to address above deficits and promote optimal return to PLOF; recommend transition to STR upon discharge from acute hospitalization.       Follow Up Recommendations SNF    Equipment Recommendations  None recommended by PT    Recommendations for Other Services       Precautions / Restrictions Precautions Precautions: Fall Restrictions Weight Bearing Restrictions: No      Mobility  Bed Mobility Overal bed mobility: Needs Assistance Bed Mobility: Rolling Rolling: Max assist         General bed mobility comments: needs assist to initiate rolling to B sides and is unable to maintain sidelying due to pain. Unable to participate in further OOB mobility this date.  Transfers                 General transfer comment: unable  Ambulation/Gait                Stairs            Wheelchair Mobility    Modified Rankin (Stroke Patients Only)       Balance                                              Pertinent Vitals/Pain Pain Assessment: Faces Faces Pain Scale: Hurts a little bit Pain Location: back while rolling Pain Descriptors / Indicators: Aching Pain Intervention(s): Limited activity within patient's tolerance;Repositioned    Home Living Family/patient expects to be discharged to:: Private residence Living Arrangements: Spouse/significant other Available Help at Discharge: Family;Available 24 hours/day Type of Home: House Home Access: Ramped entrance     Home Layout: Two level;Able to live on main level with bedroom/bathroom Home Equipment: Gilford Rile - 2 wheels;Bedside commode;Transport chair;Hospital bed Additional Comments: [place] aide comes 3x weekly for 4 hours each    Prior Function Level of Independence: Needs assistance   Gait / Transfers Assistance Needed: only ambulates a few feet to transfer to [facility] with physical assist and no AD. However recently, has been too weak to ambulate to [facility]     Comments: Spends all of her time in recliner, refuses to use lift chair     Hand Dominance        Extremity/Trunk Assessment   Upper Extremity Assessment Upper Extremity Assessment: Generalized weakness(B UE grossly 3+/5)    Lower Extremity Assessment Lower Extremity Assessment: Generalized weakness(R LE grossly 3-/5; L LE grossly 3+/5)       Communication   Communication: No difficulties  Cognition Arousal/Alertness: Awake/alert Behavior During Therapy: WFL for tasks assessed/performed Overall Cognitive Status: Impaired/Different from baseline                                 General Comments: confused to place and situation, kept asking why writer was in the room      General Comments      Exercises     Assessment/Plan    PT Assessment Patient needs continued PT services  PT Problem List Decreased strength;Decreased activity tolerance;Decreased mobility;Decreased cognition;Pain       PT Treatment Interventions Gait  training;Therapeutic exercise;Balance training    PT Goals (Current goals can be found in the Care Plan section)  Acute Rehab PT Goals Patient Stated Goal: to go to rehab PT Goal Formulation: With family Time For Goal Achievement: 03/20/18 Potential to Achieve Goals: Fair    Frequency Min 2X/week   Barriers to discharge Decreased caregiver support      Co-evaluation               AM-PAC PT "6 Clicks" Daily Activity  Outcome Measure Difficulty turning over in bed (including adjusting bedclothes, sheets and blankets)?: Unable Difficulty moving from lying on back to sitting on the side of the bed? : Unable Difficulty sitting down on and standing up from a chair with arms (e.g., wheelchair, bedside commode, etc,.)?: Unable Help needed moving to and from a bed to chair (including a wheelchair)?: Total Help needed walking in hospital room?: Total Help needed climbing 3-5 steps with a railing? : Total 6 Click Score: 6    End of Session   Activity Tolerance: Patient limited by pain Patient left: in bed;with family/visitor present Nurse Communication: Mobility status PT Visit Diagnosis: Muscle weakness (generalized) (M62.81);Difficulty in walking, not elsewhere classified (R26.2);Pain Pain - part of body: (back)    Time: 5053-9767 PT Time Calculation (min) (ACUTE ONLY): 27 min   Charges:   PT Evaluation $PT Eval Moderate Complexity: 1 [address] PT, DPT [phone]   Odas Ozer 03/06/2018, 3:45 PM

## 2018-03-06 NOTE — Plan of Care (Signed)

## 2018-03-06 NOTE — Progress Notes (Signed)
Initial Nutrition Assessment  DOCUMENTATION CODES:   Morbid obesity  INTERVENTION:  Recommend liberalizing diet to carbohydrate modified.  Provide Premier Protein po BID, each supplement provides 160 kcal and 30 grams of protein. Patient prefers vanilla.  Continue MVI po daily.  Provide Ocuvite po QHS for wound healing (provides adequate vitamin C and zinc for wound healing and also contains copper to help prevent induced copper deficiency from long-term zinc supplementation).  Will discontinue separate vitamin C supplementation as Ocuvite is being provided.  Reviewed "Pressure Injury Nutrition Therapy" handout from the Academy of Nutrition and Dietetics. Encouraged adequate intake of protein for wound healing and provided specific protein recommendations for patient. Encouraged intake of adequate fluid and specific fluid goal. Reviewed recommended vitamin/mineral supplementation. Encouraged intake of foods that are high in vitamin C and zinc and reviewed food list. Discussed importance of glycemic control for proper wound healing.  Reviewed "Ready, Set, Start Counting!" handout from the Academy of Nutrition and Dietetics. Encouraged consistent intake of carbohydrates throughout the day. Discussed limiting intake of candy, sweets, and sugar-sweetened beverages, and instead choosing carbohydrate foods that contain fiber at meals (whole grains, fruits, vegetables). Reviewed portion sizes for foods that contain carbohydrates and recommended amount at meals.  NUTRITION DIAGNOSIS:   Increased nutrient needs related to wound healing as evidenced by estimated needs.  GOAL:   Patient will meet greater than or equal to 90% of their needs  MONITOR:   PO intake, Supplement acceptance, Labs, Weight trends, Skin, I & O's  REASON FOR ASSESSMENT:   Malnutrition Screening Tool, Consult Wound healing  ASSESSMENT:   84 year old female with PMHx of arthritis, gout, OP, glaucoma, HTN, hx gastric  ulcer, hx hiatal hernia, DM type 2, recurrent DVT, hx breast cancer s/p XRT, hx partial colectomy who is admitted with sacral pressure ulcers, candidal skin infection.    -Per chart plan will be for discharge to SNF tomorrow after being seen by [facility] RN.  Met with patient and her husband at bedside. Husband is caregiver for patient at home. Patient initially reported her appetite has been good, but husband reports it has actually been decreased for a while now related to pain from her wounds. Breakfast is her most well-balanced meal and that is usually orange slices, banana, microwave sausage croissant or biscuit, and occasionally oatmeal or cereal. Lunch is usually a sandwich but occasionally can be a more balanced meal of pork chops or another meat with sides. In the evening patient has a snack of coffee or tea with milk and an English muffin. She has been taking a multivitamin daily, vitamin D, vitamin C, and a few other vitamins that husband cannot recall. She reports she has had diabetes for at least 12 years now. She is on Januvia 100 mg PO QHS. Husband reports she used to have better glycemic control, but lately he has had to use ice cream and other sugary foods/drinks as rewards for patient to complete ADLs. Husband reports mobility started decreasing after a knee surgery in January. She then started developing her pressure injuries in March of this year. At one point husband reports there were 7 wounds, but he is unsure how many there are now.  Patient and husband are unsure of weight history because they report they have not been able to obtain an accurate weight in a while due to immobility and wheelchair. They feel she is losing weight and muscle mass. Per chart patient was 122.2-126.9 kg in 2016, 208.7-114.1 kg in 2017,  and 113.4 kg on 10/21/2017 (though unsure if that was in the wheelchair or not). Current weight of 113 kg appears to be estimated.  Medications reviewed and include: allopurinol,  anastrozole, Oscal with D 1 tablet daily, vitamin D 1000 units daily, Diflucan 165 mg PO daily, folic acid 1 mg daily, Novolog 0-9 units TID, Novolog 0-5 units QHS, MVI daily, pantoprazole, Xarelto, vitamin C 1000 mg daily.  Labs reviewed: CBG 188-293 past 24 hrs. On 8/24 BUN 30, Creatinine 1.2, HgbA1c 10.9.  Patient is at risk for malnutrition but does not meet criteria for malnutrition at this time.  Discussed with RN.  NUTRITION - FOCUSED PHYSICAL EXAM:    Most Recent Value  Orbital Region  No depletion  Upper Arm Region  No depletion  Thoracic and Lumbar Region  No depletion  Buccal Region  No depletion  Temple Region  No depletion  Clavicle Bone Region  Mild depletion  Clavicle and Acromion Bone Region  Mild depletion  Scapular Bone Region  Unable to assess  Dorsal Hand  Moderate depletion  Patellar Region  No depletion  Anterior Thigh Region  No depletion  Posterior Calf Region  No depletion  Edema (RD Assessment)  -- [non-pitting to bilateral lower extremities]  Hair  Reviewed [alopecia]  Eyes  Reviewed  Mouth  Reviewed  Skin  Reviewed [ecchymosis]  Nails  Reviewed    Difficult to truly assess muscle status in setting of body habitus.  Diet Order:   Diet Order            Diet heart healthy/carb modified Room service appropriate? Yes; Fluid consistency: Thin  Diet effective now              EDUCATION NEEDS:   Education needs have been addressed  Skin:  Skin Assessment: Skin Integrity Issues: Skin Integrity Issues:: Stage II, Stage III, Other (Comment) Stage II: multiple to buttocks (pending WOCRN assessment still) Stage III: multiple to buttocks (pending WOCRN assessment still) Other: MSAD to breast, buttocks, labia, perineum, vagina   Last BM:  Unknown/PTA  Height:   Ht Readings from Last 1 Encounters:  03/04/18 5' 4"  (1.626 m)    Weight:   Wt Readings from Last 1 Encounters:  03/04/18 113 kg    Ideal Body Weight:  54.5 kg  BMI:  Body mass  index is 42.76 kg/m.  Estimated Nutritional Needs:   Kcal:  7903-8333 (MSJ x 1.2-1.4)  Protein:  100-115 grams (0.9-1 grams/kg)  Fluid:  1.8-2.1 L/day (1 mL/kcal)  Willey Blade, MS, RD, LDN Office: [phone] Pager: [phone] After Hours/Weekend Pager: [phone]

## 2018-03-06 NOTE — Progress Notes (Signed)
PT Cancellation Note  Patient Details Name: [name] MRN: [id number] DOB: [birth year]   Cancelled Treatment:    Reason Eval/Treat Not Completed: Other (comment). Evaluation attempted, however pt receiving wound care per RN. Wished to return at another time.   Ruth Kovich 03/06/2018, 11:17 AM  Greggory Stallion, PT, DPT [phone]

## 2018-03-06 NOTE — Consult Note (Signed)
[place] Nurse wound consult note Reason for Consult: Sacral, bilateral IT, bilateral inguinal ulcerations.  Fungal overgrowth with satellite lesions in the inframammary, bilateral inguinal and sub pannicular areas. Heels are intact. Wound type: Pressure, moisture, shear Pressure Injury POA: Yes Measurement: Sacral (Stage 3):  11cm x 10cm x 0.1cm with deep red tissue, friable, bleeding Right ischial tuberosity (Stage 3): 3.8cm x 4.5cm x 2.4 red, moist wound bed, friable Left ischial tuberosity (Stage 3): 2.6cm x 3.2cm x 0.2cm with red, macerated wound bed Right inguinal (full thickness): 3.2cm x 3.8cm with hypergranulation tissue (+o.1cm) that is smooth and non granulating Intertriginous dermatitis (full and partial thickness) in the inframammary area is much improved since yesterday according to staff. The sub pannicular area is also improved, albeit less so. NB:  It is recommended that the InterDry Ag+ product be used with no creams, powders or ointments. The right inguinal is also drying and area is not bleeding today (an improvement over yesterday.  Wound bed:See above Drainage (amount, consistency, odor) See above Periwound: See above, dry and intact when not macerated Dressing procedure/placement/frequency: Patient was placed yesterday on a bariatric bed with low air loss feature and it substantially improved her skin conditions related to pressure and moisture.  Simultaneously, Nursing staff made interventions and performed wound care with frequency required to keep areas therapeutically moist, but not wet.  Turning and repositioning was performed despite patient protests; patient was medicated prior to dressing and position changes.  Patient spends majority of time in a recliner chair and is incontinent and immobile the majority of the time. Patient's husband is the primary care provider and I spoke with him after my visit today. He admits that when patient protests, it renders him unable to  perform the hygiene and position changes he knows are required.  I have made some minor changes to the skin and wound care regimen, specifically, adding a calcium alginate dressing to the sacrum and bilateral IT wounds that are friable and bleeding as it is has hemostatic properties. A silver hydrofiber is added to the right inguinal wound that has a slick, smooth and hypergranulated wound bed as that will absorb excess moisture and correct bioburden and flatten this exuberant tissue so that reepithelialization can occur.  It is noted that the RD has been consulted and that nutritional supplements are provided. Patient is in need to increased protein stores to promote wound healing.  [place] nursing team will not follow, but will remain available to this patient, the nursing and medical teams.  Please re-consult if needed. Thanks, Maudie Flakes, MSN, RN, [place], Arther Abbott  Pager# [phone]

## 2018-03-06 NOTE — Progress Notes (Signed)
[facility] [place] NAME: [name]    MR#:  [id number]  DATE OF BIRTH:  [birth year]  SUBJECTIVE:  C/o back pain anxious wants to go home.  Per RN large Blood clot removed from sacral wound  REVIEW OF SYSTEMS:   Review of Systems  Constitutional: Negative for chills, fever and weight loss.  HENT: Negative for ear discharge, ear pain and nosebleeds.   Eyes: Negative for blurred vision, pain and discharge.  Respiratory: Negative for sputum production, shortness of breath, wheezing and stridor.   Cardiovascular: Negative for chest pain, palpitations, orthopnea and PND.  Gastrointestinal: Negative for abdominal pain, diarrhea, nausea and vomiting.  Genitourinary: Negative for frequency and urgency.  Musculoskeletal: Positive for back pain. Negative for joint pain.  Neurological: Negative for sensory change, speech change, focal weakness and weakness.  Psychiatric/Behavioral: Negative for depression and hallucinations. The patient is not nervous/anxious.    Tolerating Diet:yes Tolerating PT: 3 people assist  DRUG ALLERGIES:   Allergies  Allergen Reactions  . Metformin Diarrhea  . Oxycodone-Acetaminophen Nausea And Vomiting  . Oxycodone-Acetaminophen Nausea Only  . Amoxicillin Rash and Other (See Comments)    Has patient had a PCN reaction causing immediate rash, facial/tongue/throat swelling, SOB or lightheadedness with hypotension: No Has patient had a PCN reaction causing severe rash involving mucus membranes or skin necrosis: No Has patient had a PCN reaction that required hospitalization No Has patient had a PCN reaction occurring within the last 10 years: No If all of the above answers are "NO", then may proceed with Cephalosporin use.    VITALS:  Blood pressure (!) 158/53, pulse 84, temperature 98.4 F (36.9 C), temperature source Oral, resp. rate 18, height 5\' 4"  (1.626 m), weight 113 kg, SpO2 95 %.  PHYSICAL  EXAMINATION:   Physical Exam  GENERAL:  84 y.o.-year-old patient lying in the bed with no acute distress. obese EYES: Pupils equal, round, reactive to light and accommodation. No scleral icterus. Extraocular muscles intact.  HEENT: Head atraumatic, normocephalic. Oropharynx and nasopharynx clear.  NECK:  Supple, no jugular venous distention. No thyroid enlargement, no tenderness.  LUNGS: Normal breath sounds bilaterally, no wheezing, rales, rhonchi. No use of accessory muscles of respiration.  CARDIOVASCULAR: S1, S2 normal. No murmurs, rubs, or gallops.  ABDOMEN: Soft, nontender, nondistended. Bowel sounds present. No organomegaly or mass. Papular skin rash in the abd folds and under the breast  EXTREMITIES: No cyanosis, clubbing or edema b/l.    NEUROLOGIC: Cranial nerves II through XII are intact. No focal Motor or sensory deficits b/l.   PSYCHIATRIC:  patient is alert and oriented x 3.  SKIN: stage II/III decubitus ulcer--chronic  LABORATORY PANEL:  CBC Recent Labs  Lab 03/05/18 0553  WBC 7.8  HGB 11.4*  HCT 33.3*  PLT 241    Chemistries  Recent Labs  Lab 03/04/18 1459 03/05/18 0553  NA 138 141  K 5.0 4.4  CL 107 109  CO2 22 25  GLUCOSE 360* 271*  BUN 33* 30*  CREATININE 1.35* 1.20*  CALCIUM 9.2 8.9  AST 26  --   ALT 15  --   ALKPHOS 90  --   BILITOT 0.6  --    Cardiac Enzymes No results for input(s): TROPONINI in the last 168 hours. RADIOLOGY:  No results found. ASSESSMENT AND PLAN:   Stage II/IIIsacral pressure ulcers- 4 present on the bottom, no signs of bacterial infection. - wound care consult placed - q2hr turns -  nutrition consult - tylenol and morphine for pain -pt's wounds need daily care to help healing given her being bed boud husband not able to do so and she is 3 person assist.  Candidal skin infection- present underneath both breasts and in the groin area. No signs of secondary bacterial infection. -  topical nystatin due to skin  breakdown - continue diflucan 100mg  daily - continue topical ketoconazole underneath breasts - wound care consult  Immobility- requiring near total care at home. Spends all day in a recliner. Can take a few steps to bedside commode. - PT/OT consult - plan for discharge to SNF for management of wounds to prevent from progressing   Hypertension- normotensive in the ED - continue atenolol, lisinopril - hydralazine IV prn  Type 2 diabetes- on januvia at home - sensitive SSI  Recurrent DVTs- no signs of DVT on exam - continue home xarelto  History of gout- no acute flare - continue allopurinol   History of right breast cancer- s/p radiation - continue home anastrozole  History of osteoporosis - continue calcium and vitamin D  To SNF on Monday after seen by wound RN  Consider palliative care to follow at SNF  Case discussed with Care Management/Social Worker. Management plans discussed with the patient, family and they are in agreement.  CODE STATUS: FUll  DVT Prophylaxis: xarelto  TOTAL TIME TAKING CARE OF THIS PATIENT: *30* minutes.  >50% time spent on counselling and coordination of care  POSSIBLE D/C IN *1-2* DAYS, DEPENDING ON CLINICAL CONDITION.  Note: This dictation was prepared with Dragon dictation along with smaller phrase technology. Any transcriptional errors that result from this process are unintentional.  Fritzi Mandes M.D on 03/06/2018 at 9:46 AM  Between 7am to 6pm - Pager - [phone]  After 6pm go to [email or web address] - password [email or web address] [facility]  Office  [phone]  CC: Primary care physician; Adin Hector, MDPatient ID: [name], female   DOB: [birth year], 84 y.o.   MRN: [id number]

## 2018-03-06 NOTE — Progress Notes (Signed)
[facility] [place] NAME: [name]    MR#:  [id number]  DATE OF BIRTH:  02/14/1929  SUBJECTIVE:  C/o back pain anxious wants to go home.  Per RN large Blood clot removed from sacral wound  REVIEW OF SYSTEMS:   Review of Systems  Constitutional: Negative for chills, fever and weight loss.  HENT: Negative for ear discharge, ear pain and nosebleeds.   Eyes: Negative for blurred vision, pain and discharge.  Respiratory: Negative for sputum production, shortness of breath, wheezing and stridor.   Cardiovascular: Negative for chest pain, palpitations, orthopnea and PND.  Gastrointestinal: Negative for abdominal pain, diarrhea, nausea and vomiting.  Genitourinary: Negative for frequency and urgency.  Musculoskeletal: Positive for back pain. Negative for joint pain.  Neurological: Negative for sensory change, speech change, focal weakness and weakness.  Psychiatric/Behavioral: Negative for depression and hallucinations. The patient is not nervous/anxious.    Tolerating Diet:yes Tolerating PT: 3 people assist  DRUG ALLERGIES:   Allergies  Allergen Reactions  . Metformin Diarrhea  . Oxycodone-Acetaminophen Nausea And Vomiting  . Oxycodone-Acetaminophen Nausea Only  . Amoxicillin Rash and Other (See Comments)    Has patient had a PCN reaction causing immediate rash, facial/tongue/throat swelling, SOB or lightheadedness with hypotension: No Has patient had a PCN reaction causing severe rash involving mucus membranes or skin necrosis: No Has patient had a PCN reaction that required hospitalization No Has patient had a PCN reaction occurring within the last 10 years: No If all of the above answers are "NO", then may proceed with Cephalosporin use.    VITALS:  Blood pressure (!) 158/53, pulse 84, temperature 98.4 F (36.9 C), temperature source Oral, resp. rate 18, height 5\' 4"  (1.626 m), weight 113 kg, SpO2 95 %.  PHYSICAL  EXAMINATION:   Physical Exam  GENERAL:  84 y.o.-year-old patient lying in the bed with no acute distress. obese EYES: Pupils equal, round, reactive to light and accommodation. No scleral icterus. Extraocular muscles intact.  HEENT: Head atraumatic, normocephalic. Oropharynx and nasopharynx clear.  NECK:  Supple, no jugular venous distention. No thyroid enlargement, no tenderness.  LUNGS: Normal breath sounds bilaterally, no wheezing, rales, rhonchi. No use of accessory muscles of respiration.  CARDIOVASCULAR: S1, S2 normal. No murmurs, rubs, or gallops.  ABDOMEN: Soft, nontender, nondistended. Bowel sounds present. No organomegaly or mass. Papular skin rash in the abd folds and under the breast  EXTREMITIES: No cyanosis, clubbing or edema b/l.    NEUROLOGIC: Cranial nerves II through XII are intact. No focal Motor or sensory deficits b/l.   PSYCHIATRIC:  patient is alert and oriented x 3.  SKIN: stage II/III decubitus ulcer--chronic  LABORATORY PANEL:  CBC Recent Labs  Lab 03/05/18 0553  WBC 7.8  HGB 11.4*  HCT 33.3*  PLT 241    Chemistries  Recent Labs  Lab 03/04/18 1459 03/05/18 0553  NA 138 141  K 5.0 4.4  CL 107 109  CO2 22 25  GLUCOSE 360* 271*  BUN 33* 30*  CREATININE 1.35* 1.20*  CALCIUM 9.2 8.9  AST 26  --   ALT 15  --   ALKPHOS 90  --   BILITOT 0.6  --    Cardiac Enzymes No results for input(s): TROPONINI in the last 168 hours. RADIOLOGY:  No results found. ASSESSMENT AND PLAN:   Stage II/IIIsacral pressure ulcers- 4 present on the bottom, no signs of bacterial infection. - wound care consult placed - q2hr turns -  nutrition consult - tylenol and morphine for pain -pt's wounds need daily care to help healing given her being bed boud husband not able to do so and she is 3 person assist.  Candidal skin infection- present underneath both breasts and in the groin area. No signs of secondary bacterial infection. -  topical nystatin due to skin  breakdown - continue diflucan 100mg  daily - continue topical ketoconazole underneath breasts - wound care consult  Immobility- requiring near total care at home. Spends all day in a recliner. Can take a few steps to bedside commode. - PT/OT consult - plan for discharge to SNF for management of wounds to prevent from progressing   Hypertension- normotensive in the ED - continue atenolol, lisinopril - hydralazine IV prn  Type 2 diabetes- on januvia at home - sensitive SSI  Recurrent DVTs- no signs of DVT on exam - continue home xarelto  History of gout- no acute flare - continue allopurinol   History of right breast cancer- s/p radiation - continue home anastrozole  History of osteoporosis - continue calcium and vitamin D  To SNF on Monday after seen by wound RN  Consider palliative care to follow at SNF  Case discussed with Care Management/Social Worker. Management plans discussed with the patient, family and they are in agreement.  CODE STATUS: FUll  DVT Prophylaxis: xarelto  TOTAL TIME TAKING CARE OF THIS PATIENT: *30* minutes.  >50% time spent on counselling and coordination of care  POSSIBLE D/C IN *1-2* DAYS, DEPENDING ON CLINICAL CONDITION.  Note: This dictation was prepared with Dragon dictation along with smaller phrase technology. Any transcriptional errors that result from this process are unintentional.  Fritzi Mandes M.D on 03/06/2018 at 9:36 AM  Between 7am to 6pm - Pager - [phone]  After 6pm go to [email or web address] - password [email or web address] [facility]  Office  [phone]  CC: Primary care physician; Adin Hector, MDPatient ID: [name], female   DOB: [birth year], 84 y.o.   MRN: [id number]

## 2018-03-07 LAB — GLUCOSE, CAPILLARY
GLUCOSE-CAPILLARY: 206 mg/dL — AB (ref 70–99)
GLUCOSE-CAPILLARY: 157 mg/dL — AB (ref 70–99)
Glucose-Capillary: 185 mg/dL — ABNORMAL HIGH (ref 70–99)
Glucose-Capillary: 157 mg/dL — ABNORMAL HIGH (ref 70–99)

## 2018-03-07 MED ORDER — TRAMADOL HCL 50 MG PO TABS: 50.0000 mg | ORAL_TABLET | Freq: Four times a day (QID) | ORAL | Status: DC | PRN

## 2018-03-07 MED ORDER — OXYCODONE HCL 5 MG PO TABS: 5.0000 mg | ORAL_TABLET | ORAL | Status: DC | PRN

## 2018-03-07 MED ORDER — TRAMADOL HCL 50 MG PO TABS
50.0000 mg | ORAL_TABLET | Freq: Four times a day (QID) | ORAL | Status: DC | PRN
  Administered 2018-03-08: 50 mg via ORAL
  Filled 2018-03-07: qty 1

## 2018-03-07 MED ORDER — OXYCODONE HCL 5 MG PO TABS
5.0000 mg | ORAL_TABLET | ORAL | Status: DC | PRN
  Administered 2018-03-07: 5 mg via ORAL
  Filled 2018-03-07: qty 1

## 2018-03-07 NOTE — Progress Notes (Signed)
[facility] at [place] NAME: [name]    MR#:  [id number]  DATE OF BIRTH:  1928-08-24  SUBJECTIVE:  C/o back pain, reporting 8-9 out of 10 Husband at bedside   REVIEW OF SYSTEMS:   Review of Systems  Constitutional: Negative for chills, fever and weight loss.  HENT: Negative for ear discharge, ear pain and nosebleeds.   Eyes: Negative for blurred vision, pain and discharge.  Respiratory: Negative for sputum production, shortness of breath, wheezing and stridor.   Cardiovascular: Negative for chest pain, palpitations, orthopnea and PND.  Gastrointestinal: Negative for abdominal pain, diarrhea, nausea and vomiting.  Genitourinary: Negative for frequency and urgency.  Musculoskeletal: Positive for back pain. Negative for joint pain.  Neurological: Negative for sensory change, speech change, focal weakness and weakness.  Psychiatric/Behavioral: Negative for depression and hallucinations. The patient is not nervous/anxious.    Tolerating Diet:yes Tolerating PT: 3 people assist  DRUG ALLERGIES:   Allergies  Allergen Reactions  . Metformin Diarrhea  . Oxycodone-Acetaminophen Nausea And Vomiting  . Oxycodone-Acetaminophen Nausea Only  . Amoxicillin Rash and Other (See Comments)    Has patient had a PCN reaction causing immediate rash, facial/tongue/throat swelling, SOB or lightheadedness with hypotension: No Has patient had a PCN reaction causing severe rash involving mucus membranes or skin necrosis: No Has patient had a PCN reaction that required hospitalization No Has patient had a PCN reaction occurring within the last 10 years: No If all of the above answers are "NO", then may proceed with Cephalosporin use.    VITALS:  Blood pressure (!) 138/54, pulse 77, temperature 98.2 F (36.8 C), temperature source Oral, resp. rate 20, height 5\' 4"  (1.626 m), weight 113 kg, SpO2 92 %.  PHYSICAL EXAMINATION:   Physical  Exam  GENERAL:  84 y.o.-year-old patient lying in the bed with no acute distress. obese EYES: Pupils equal, round, reactive to light and accommodation. No scleral icterus. Extraocular muscles intact.  HEENT: Head atraumatic, normocephalic. Oropharynx and nasopharynx clear.  NECK:  Supple, no jugular venous distention. No thyroid enlargement, no tenderness.  LUNGS: Normal breath sounds bilaterally, no wheezing, rales, rhonchi. No use of accessory muscles of respiration.  CARDIOVASCULAR: S1, S2 normal. No murmurs, rubs, or gallops.  ABDOMEN: Soft, nontender, nondistended. Bowel sounds present. No organomegaly or mass. Papular skin rash in the abd folds and under the breast  EXTREMITIES: No cyanosis, clubbing or edema b/l.    NEUROLOGIC: Cranial nerves II through XII are intact. No focal Motor or sensory deficits b/l.   PSYCHIATRIC:  patient is alert and oriented x 3.  SKIN: stage II/III decubitus ulcer--chronic  LABORATORY PANEL:  CBC Recent Labs  Lab 03/05/18 0553  WBC 7.8  HGB 11.4*  HCT 33.3*  PLT 241    Chemistries  Recent Labs  Lab 03/04/18 1459 03/05/18 0553  NA 138 141  K 5.0 4.4  CL 107 109  CO2 22 25  GLUCOSE 360* 271*  BUN 33* 30*  CREATININE 1.35* 1.20*  CALCIUM 9.2 8.9  AST 26  --   ALT 15  --   ALKPHOS 90  --   BILITOT 0.6  --    Cardiac Enzymes No results for input(s): TROPONINI in the last 168 hours. RADIOLOGY:  No results found. ASSESSMENT AND PLAN:   Stage II/IIIsacral pressure ulcers- 4 present on the bottom, no signs of bacterial infection. - wound care is following - q2hr turns - nutrition consult placed for malnutrition -  tylenol and morphine for pain -pt's wounds need daily care to help healing given her being bed boud husband not able to do so and she is 3 person assist. -Pain management with IV morphine for severe pain and tramadol for mild to moderate pain.  Anticipating to discharge patient home once pain is under better  control  Candidal skin infection- present underneath both breasts and in the groin area. No signs of secondary bacterial infection. -  topical nystatin due to skin breakdown - continue diflucan 100mg  daily - continue topical ketoconazole underneath breasts - wound care consult  Immobility- requiring near total care at home. Spends all day in a recliner. Can take a few steps to bedside commode. - PT/OT consult - plan for discharge to SNF for management of wounds to prevent from progressing   Hypertension- normotensive in the ED - continue atenolol, lisinopril - hydralazine IV prn  Type 2 diabetes- on januvia at home - sensitive SSI  Recurrent DVTs- no signs of DVT on exam - continue home xarelto  History of gout- no acute flare - continue allopurinol   History of right breast cancer- s/p radiation - continue home anastrozole  History of osteoporosis - continue calcium and vitamin D  To SNF on TuesDAY  Consider palliative care to follow at SNF  Case discussed with Care Management/Social Worker. Management plans discussed with the patient, family and they are in agreement.  CODE STATUS: FUll  DVT Prophylaxis: xarelto  TOTAL TIME TAKING CARE OF THIS PATIENT: 33 minutes.  >50% time spent on counselling and coordination of care  POSSIBLE D/C IN *1 DAYS, DEPENDING ON CLINICAL CONDITION.  Note: This dictation was prepared with Dragon dictation along with smaller phrase technology. Any transcriptional errors that result from this process are unintentional.  Nicholes Mango M.D on 03/07/2018 at 3:07 PM  Between 7am to 6pm - Pager - [phone]  After 6pm go to [email or web address] - password [email or web address] [facility]  Office  [phone]  CC: Primary care physician; Adin Hector, MDPatient ID: [name], female   DOB: 1928-07-16, 84 y.o.   MRN: [id number]

## 2018-03-07 NOTE — Progress Notes (Signed)
Pt tol PO pain medication well. Pt appears in NAD although pt states pain is always 9/10. When pain scale explained to pt, pt is unsure what it means. FACES scale used and pt expresses pain as 2-3 after PO pain medication.

## 2018-03-07 NOTE — Progress Notes (Signed)
Called to pt's room by pt's husband. Pt having increased confusion asking RN to "call her parents". Messaged Dr Margaretmary Eddy regarding pain management options. Pt has tramadol ordered. Ok to d/c oxycodone per MD.

## 2018-03-07 NOTE — Care Management Important Message (Signed)
Important Message  Patient Details  Name: [name] MRN: [id number] Date of Birth: [birth year]   Medicare Important Message Given:  Yes    [name] 03/07/2018, 1:30 PM

## 2018-03-08 LAB — CREATININE, SERUM
CREATININE: 1.02 mg/dL — AB (ref 0.44–1.00)
GFR calc Af Amer: 56 mL/min — ABNORMAL LOW (ref >60)
GFR, EST NON AFRICAN AMERICAN: 48 mL/min — AB (ref >60)

## 2018-03-08 LAB — CBC
HCT: 35 % (ref 35.0–47.0)
Hemoglobin: 11.7 g/dL — ABNORMAL LOW (ref 12.0–16.0)
MCH: 31.1 pg (ref 26.0–34.0)
MCHC: 33.5 g/dL (ref 32.0–36.0)
MCV: 92.8 fL (ref 80.0–100.0)
PLATELETS: 266 10*3/uL (ref 150–440)
RBC: 3.78 MIL/uL — ABNORMAL LOW (ref 3.80–5.20)
RDW: 14.6 % — AB (ref 11.5–14.5)
WBC: 11.3 10*3/uL — AB (ref 3.6–11.0)

## 2018-03-08 LAB — GLUCOSE, CAPILLARY
GLUCOSE-CAPILLARY: 219 mg/dL — AB (ref 70–99)
Glucose-Capillary: 177 mg/dL — ABNORMAL HIGH (ref 70–99)

## 2018-03-08 LAB — PROCALCITONIN

## 2018-03-08 MED ORDER — PREMIER PROTEIN SHAKE: 11.0000 [oz_av] | Freq: Two times a day (BID) | ORAL | 0 refills | Status: AC

## 2018-03-08 MED ORDER — ONDANSETRON HCL 4 MG PO TABS: 4.0000 mg | ORAL_TABLET | Freq: Four times a day (QID) | ORAL | 0 refills | Status: AC | PRN

## 2018-03-08 MED ORDER — INSULIN ASPART 100 UNIT/ML ~~LOC~~ SOLN: 0.0000 [IU] | Freq: Every day | SUBCUTANEOUS | 11 refills | Status: AC

## 2018-03-08 MED ORDER — BISACODYL 10 MG RE SUPP: 10.0000 mg | Freq: Once | RECTAL | Status: DC

## 2018-03-08 MED ORDER — ACETAMINOPHEN 325 MG PO TABS: 650.0000 mg | ORAL_TABLET | Freq: Four times a day (QID) | ORAL | Status: AC | PRN

## 2018-03-08 MED ORDER — FLUCONAZOLE 100 MG PO TABS: 100.0000 mg | ORAL_TABLET | Freq: Every day | ORAL | 0 refills | Status: AC

## 2018-03-08 MED ORDER — TRAMADOL HCL 50 MG PO TABS: 50.0000 mg | ORAL_TABLET | Freq: Four times a day (QID) | ORAL | 0 refills | Status: AC | PRN

## 2018-03-08 MED ORDER — INSULIN ASPART 100 UNIT/ML ~~LOC~~ SOLN: 0.0000 [IU] | Freq: Three times a day (TID) | SUBCUTANEOUS | 11 refills | Status: AC

## 2018-03-08 MED ORDER — POLYETHYLENE GLYCOL 3350 17 G PO PACK: 17.0000 g | PACK | Freq: Every day | ORAL | 0 refills | Status: AC | PRN

## 2018-03-08 MED ORDER — ADULT MULTIVITAMIN W/MINERALS CH: 1.0000 | ORAL_TABLET | Freq: Every day | ORAL | Status: AC

## 2018-03-08 MED ORDER — NYSTATIN 100000 UNIT/GM EX POWD: Freq: Two times a day (BID) | CUTANEOUS | 0 refills | Status: AC

## 2018-03-08 MED ORDER — OCUVITE-LUTEIN PO CAPS: 1.0000 | ORAL_CAPSULE | Freq: Every day | ORAL | 0 refills | Status: AC

## 2018-03-08 NOTE — Clinical Social Work Placement (Signed)
   CLINICAL SOCIAL WORK PLACEMENT  NOTE  Date:  03/08/2018  Patient Details  Name: [name] MRN: [id number] Date of Birth: [birth year]  Clinical Social Work is seeking post-discharge placement for this patient at the [place] level of care (*CSW will initial, date and re-position this form in  chart as items are completed):  Yes   Patient/family provided with [facility] Department's list of facilities offering this level of care within the geographic area requested by the patient (or if unable, by the patient's family).  Yes   Patient/family informed of their freedom to choose among providers that offer the needed level of care, that participate in Medicare, Medicaid or managed care program needed by the patient, have an available bed and are willing to accept the patient.  Yes   Patient/family informed of [facility]'s ownership interest in [facility] and [facility] as well as of the fact that they are under no obligation to receive care at these facilities.  PASRR submitted to EDS on 03/05/18     PASRR number received on 03/05/18     Existing PASRR number confirmed on       FL2 transmitted to all facilities in geographic area requested by pt/family on       FL2 transmitted to all facilities within larger geographic area on       Patient informed that his/her managed care company has contracts with or will negotiate with certain facilities, including the following:        Yes   Patient/family informed of bed offers received.  Patient chooses bed at ([facility])     Physician recommends and patient chooses bed at (snf)    Patient to be transferred to ([facility]) on 03/08/18.  Patient to be transferred to facility by ([facility])     Patient family notified on 03/08/18 of transfer.  Name of family member notified:  husband     PHYSICIAN       Additional Comment:    _______________________________________________ Shela Leff,  LCSW 03/08/2018, 11:19 AM

## 2018-03-08 NOTE — Discharge Summary (Signed)
[facility] NAME: [name]    MR#:  [id number]  DATE OF BIRTH:  [birth year]  DATE OF ADMISSION:  03/04/2018 ADMITTING PHYSICIAN: Sela Hua, MD  DATE OF DISCHARGE: 03/08/18  PRIMARY CARE PHYSICIAN: Adin Hector, MD    ADMISSION DIAGNOSIS:  Intertrigo [L30.4] Fungal infection [B49]  DISCHARGE DIAGNOSIS:  Active Problems:   Sacral pressure ulcer   SECONDARY DIAGNOSIS:   Past Medical History:  Diagnosis Date  . Arthritis   . Basal cell carcinoma 2019   LLE  . Breast cancer ([facility]) 2014   right- radiation. Dr Pat Patrick  . Diabetes mellitus without complication ([facility])   . Gastric ulcer   . Glaucoma   . Hiatal hernia   . Hypertension   . Osteoporosis   . Recurrent deep vein thrombosis (DVT) [facility])     HOSPITAL COURSE:   HPI  [name]  is a 84 y.o. female with a known history of hypertension, type 2 diabetes, osteoporosis, recurrent DVT (on xarelto), hx right breast cancer, and morbid obesity who presented to the ED with worsening sacral pressure ulcers and groin infection. She has been following with the wound clinic. She has been on three 14 day courses of diflucan. Her husband has also been putting ketoconazole under her breasts. Her sacral wounds have been bleeding recently. She spends all day in a recliner at home. She can take 5 steps maximum. She uses a bedside commode. She denies any fevers or chills at home. Hospitalists were called for admission for failed outpatient wound care.  Stage II/IIIsacral pressure ulcers- 4 present on the bottom, no signs of bacterial infection. - wound care to be continued - q2hr turns -Seen by nutritionist for malnutrition continue diet supplements per their recommendations it is recommended that you continue to drink 2 bottles per day of:       Premier Protein Take one multivitamin with minerals daily Take Ocuvite multivitamin daily  -Pain management as needed -pt's wounds  need daily care to help healing given her being bed boud husband not able to do so and she is 3 person assist. -Pain management with Tylenol and tramadol for mild to moderate pain to severe pain as needed.  Candidal skin infection- present underneath both breasts and in the groin area. No signs of secondary bacterial infection. -  topical nystatin due to skin breakdown - continue diflucan 100mg  daily for a total of 7days -Intra-dry -Continue Foley catheter for now for better wound healing -Nml procalcitonin today though white count is slightly elevated patient is afebrile  Immobility- requiring near total care at home. Spends all day in a recliner. Can take a few steps to bedside commode. - PT/OT consult-recommending skilled nursing facility - plan for discharge to SNF for management of wounds to prevent from progressing   Hypertension- normotensive in the ED - continue atenolol, lisinopril, titrate as needed - hydralazine IV prn during the hospital course  Type 2 diabetes- on januvia at home - sensitive SSI  Recurrent DVTs- no signs of DVT on exam - continue homexarelto  History of gout- no acute flare - continue allopurinol   History of right breast cancer- s/p radiation -Patient is not on anastrozole as reported by the husband will discontinue the  same  History of osteoporosis - continue calcium and vitamin D  To SNF  TODAY   palliative care to follow at [facility]  DISCHARGE CONDITIONS:   fair  CONSULTS OBTAINED:  PROCEDURES none  DRUG ALLERGIES:   Allergies  Allergen Reactions  . Metformin Diarrhea  . Oxycodone-Acetaminophen Nausea And Vomiting  . Oxycodone-Acetaminophen Nausea Only  . Amoxicillin Rash and Other (See Comments)    Has patient had a PCN reaction causing immediate rash, facial/tongue/throat swelling, SOB or lightheadedness with hypotension: No Has patient had a PCN reaction causing severe rash involving mucus membranes or skin  necrosis: No Has patient had a PCN reaction that required hospitalization No Has patient had a PCN reaction occurring within the last 10 years: No If all of the above answers are "NO", then may proceed with Cephalosporin use.    DISCHARGE MEDICATIONS:   Allergies as of 03/08/2018      Reactions   Metformin Diarrhea   Oxycodone-acetaminophen Nausea And Vomiting   Oxycodone-acetaminophen Nausea Only   Amoxicillin Rash, Other (See Comments)   Has patient had a PCN reaction causing immediate rash, facial/tongue/throat swelling, SOB or lightheadedness with hypotension: No Has patient had a PCN reaction causing severe rash involving mucus membranes or skin necrosis: No Has patient had a PCN reaction that required hospitalization No Has patient had a PCN reaction occurring within the last 10 years: No If all of the above answers are "NO", then may proceed with Cephalosporin use.      Medication List    STOP taking these medications   anastrozole 1 MG tablet Commonly known as:  ARIMIDEX   HYDROcodone-acetaminophen 5-325 MG tablet Commonly known as:  NORCO/VICODIN   hydrocortisone 2.5 % cream     TAKE these medications   acetaminophen 325 MG tablet Commonly known as:  TYLENOL Take 2 tablets (650 mg total) by mouth every 6 (six) hours as needed for mild pain (or Fever >/= 101).   allopurinol 100 MG tablet Commonly known as:  ZYLOPRIM Take 100 mg by mouth daily.   ascorbic acid 1000 MG tablet Commonly known as:  VITAMIN C Take 1,000 mg by mouth daily.   aspirin EC 81 MG tablet Take 81 mg by mouth every evening.   atenolol 25 MG tablet Commonly known as:  TENORMIN Take 25 mg by mouth every evening.   bisacodyl 5 MG EC tablet Generic drug:  bisacodyl Take 5 mg by mouth daily as needed for moderate constipation.   cholecalciferol 1000 units tablet Commonly known as:  VITAMIN D Take 1,000 Units by mouth daily.   fluconazole 100 MG tablet Commonly known as:   DIFLUCAN Take 1 tablet (100 mg total) by mouth daily for 7 days.   FOLIC ACID PO Take 1 tablet by mouth every evening.   insulin aspart 100 UNIT/ML injection Commonly known as:  novoLOG Inject 0-9 Units into the skin 3 (three) times daily with meals. CBG < 70: implement hypoglycemia protocol CBG 70 - 120: 0 units CBG 121 - 150: 1 unit CBG 151 - 200: 2 units CBG 201 - 250: 3 units CBG 251 - 300: 5 units CBG 301 - 350: 7 units CBG 351 - 400: 9 units CBG > 400: call MD and obtain STAT lab verification   insulin aspart 100 UNIT/ML injection Commonly known as:  novoLOG Inject 0-5 Units into the skin at bedtime. CBG < 70: implement hypoglycemia protocol CBG 70 - 120: 0 units CBG 121 - 150: 0 units CBG 151 - 200: 0 units CBG 201 - 250: 2 units CBG 251 - 300: 3 units CBG 301 - 350: 4 units CBG 351 - 400: 5 units CBG > 400: call MD and  obtain STAT lab verification   ketoconazole 2 % cream Commonly known as:  NIZORAL Apply 1 application topically 2 (two) times daily.   latanoprost 0.005 % ophthalmic solution Commonly known as:  XALATAN Place 1 drop into both eyes at bedtime.   lisinopril 30 MG tablet Commonly known as:  PRINIVIL,ZESTRIL Take 30 mg by mouth 2 (two) times daily.   multivitamin with minerals Tabs tablet Take 1 tablet by mouth daily. Start taking on:  03/09/2018   multivitamin-lutein Caps capsule Take 1 capsule by mouth at bedtime.   nystatin powder Commonly known as:  MYCOSTATIN/NYSTOP Apply topically 2 (two) times daily.   omeprazole 20 MG capsule Commonly known as:  PRILOSEC Take 20 mg by mouth daily as needed.   ondansetron 4 MG tablet Commonly known as:  ZOFRAN Take 1 tablet (4 mg total) by mouth every 6 (six) hours as needed for nausea.   OSTEO BI-FLEX ADV DOUBLE ST PO Take 1 tablet by mouth daily.   OYSTER SHELL CALCIUM 500 + D 500-125 MG-UNIT Tabs Generic drug:  Calcium Carbonate-Vitamin D Take by mouth.   polyethylene glycol  packet Commonly known as:  MIRALAX / GLYCOLAX Take 17 g by mouth daily as needed for mild constipation.   protein supplement shake Liqd Commonly known as:  PREMIER PROTEIN Take 325 mLs (11 oz total) by mouth 2 (two) times daily between meals.   rivaroxaban 20 MG Tabs tablet Commonly known as:  XARELTO Take 20 mg by mouth daily. Takes in AM   sitaGLIPtin 100 MG tablet Commonly known as:  JANUVIA Take 100 mg by mouth every evening.   traMADol 50 MG tablet Commonly known as:  ULTRAM Take 1 tablet (50 mg total) by mouth every 6 (six) hours as needed for moderate pain.        DISCHARGE INSTRUCTIONS:   Follow-up with primary care physician at the facility in 3 days Continue Foley catheter for better wound healing Continue wound care Reposition patient every 1-2 hours Palliative care to follow-up as an outpatient in 1 week  DIET:  Cardiac diet and Diabetic diet  DISCHARGE CONDITION:  Stable  ACTIVITY:  Activity as tolerated  OXYGEN:  Home Oxygen: No.   Oxygen Delivery: room air  DISCHARGE LOCATION:  nursing home   If you experience worsening of your admission symptoms, develop shortness of breath, life threatening emergency, suicidal or homicidal thoughts you must seek medical attention immediately by calling 911 or calling your MD immediately  if symptoms less severe.  You Must read complete instructions/literature along with all the possible adverse reactions/side effects for all the Medicines you take and that have been prescribed to you. Take any new Medicines after you have completely understood and accpet all the possible adverse reactions/side effects.   Please note  You were cared for by a hospitalist during your hospital stay. If you have any questions about your discharge medications or the care you received while you were in the hospital after you are discharged, you can call the unit and asked to speak with the hospitalist on call if the hospitalist that  took care of you is not available. Once you are discharged, your primary care physician will handle any further medical issues. Please note that NO REFILLS for any discharge medications will be authorized once you are discharged, as it is imperative that you return to your primary care physician (or establish a relationship with a primary care physician if you do not have one) for your aftercare needs  so that they can reassess your need for medications and monitor your lab values.     Today  No chief complaint on file.  Reports pain improved.  Husband at bedside.  Had a bowel movement today  ROS: Limited from underlying dementia CONSTITUTIONAL: Denies fevers, chills. Denies any fatigue, weakness.  RESPIRATORY: Denies cough, wheeze, shortness of breath.  CARDIOVASCULAR: Denies chest pain, palpitations, edema.  GASTROINTESTINAL: Denies nausea, vomiting, diarrhea, abdominal pain. Denies bright red blood per rectum. SKIN: Patient has sacral ulcers MUSCULOSKELETAL: Denies pain in neck, back, shoulder, knees, hips or arthritic symptoms.     VITAL SIGNS:  Blood pressure (!) 154/48, pulse 82, temperature 97.8 F (36.6 C), temperature source Oral, resp. rate 20, height 5\' 4"  (1.626 m), weight 113 kg, SpO2 91 %.  I/O:    Intake/Output Summary (Last 24 hours) at 03/08/2018 1107 Last data filed at 03/08/2018 1014 Gross per 24 hour  Intake 0 ml  Output 950 ml  Net -950 ml    PHYSICAL EXAMINATION:  GENERAL:  84 y.o.-year-old patient lying in the bed with no acute distress.  EYES: Pupils equal, round, reactive to light and accommodation. No scleral icterus. Extraocular muscles intact.  HEENT: Head atraumatic, normocephalic. Oropharynx and nasopharynx clear.  NECK:  Supple, no jugular venous distention. No thyroid enlargement, no tenderness.  LUNGS: Normal breath sounds bilaterally, no wheezing, rales,rhonchi or crepitation. No use of accessory muscles of respiration.  CARDIOVASCULAR: S1, S2  normal. No murmurs, rubs, or gallops.  ABDOMEN: Soft, non-tender, non-distended. Bowel sounds present. No organomegaly or mass.  EXTREMITIES: No pedal edema, cyanosis, or clubbing.  NEUROLOGIC: Cranial nerves II through XII are intact. Muscle strength 5/5 in all extremities. Sensation intact. Gait not checked.  PSYCHIATRIC: The patient is alert and oriented x 3.  SKIN: stage II/III decubitus ulcer--chronic  DATA REVIEW:   CBC Recent Labs  Lab 03/08/18 0438  WBC 11.3*  HGB 11.7*  HCT 35.0  PLT 266    Chemistries  Recent Labs  Lab 03/04/18 1459 03/05/18 0553 03/08/18 0438  NA 138 141  --   K 5.0 4.4  --   CL 107 109  --   CO2 22 25  --   GLUCOSE 360* 271*  --   BUN 33* 30*  --   CREATININE 1.35* 1.20* 1.02*  CALCIUM 9.2 8.9  --   AST 26  --   --   ALT 15  --   --   ALKPHOS 90  --   --   BILITOT 0.6  --   --     Cardiac Enzymes No results for input(s): TROPONINI in the last 168 hours.  Microbiology Results  Results for orders placed or performed in visit on 12/17/17  Microscopic Examination     Status: Abnormal   Collection Time: 12/17/17  1:02 PM  Result Value Ref Range Status   WBC, UA 0-5 0 - 5 /hpf Final   RBC, UA None seen 0 - 2 /hpf Final   Epithelial Cells (non renal) 0-10 0 - 10 /hpf Final   Bacteria, UA Few (A) None seen/Few Final  CULTURE, URINE COMPREHENSIVE     Status: Abnormal   Collection Time: 12/17/17  1:38 PM  Result Value Ref Range Status   Urine Culture, Comprehensive Final report (A)  Final   Organism ID, Bacteria Proteus mirabilis (A)  Final    Comment: 10,000-25,000 colony forming units per mL Cefazolin <=4 ug/mL Cefazolin with an MIC <=16 predicts susceptibility to the  oral agents cefaclor, cefdinir, cefpodoxime, cefprozil, cefuroxime, cephalexin, and loracarbef when used for therapy of uncomplicated urinary tract infections due to E. coli, Klebsiella pneumoniae, and Proteus mirabilis.    ANTIMICROBIAL SUSCEPTIBILITY Comment  Final     Comment:       ** S = Susceptible; I = Intermediate; R = Resistant **                    P = Positive; N = Negative             MICS are expressed in micrograms per mL    Antibiotic                 RSLT#1    RSLT#2    RSLT#3    RSLT#4 Amoxicillin/Clavulanic Acid    S Ampicillin                     S Cefepime                       S Ceftriaxone                    S Cefuroxime                     S Ciprofloxacin                  S Ertapenem                      S Gentamicin                     S Levofloxacin                   S Meropenem                      S Nitrofurantoin                 R Piperacillin/Tazobactam        S Tetracycline                   R Tobramycin                     S Trimethoprim/Sulfa             S     RADIOLOGY:  No results found.  EKG:   Orders placed or performed in visit on 10/15/06  . EKG 12-Lead      Management plans discussed with the patient, family and they are in agreement.  CODE STATUS:     Code Status Orders  (From admission, onward)         Start     Ordered   03/04/18 1857  Full code  Continuous     03/04/18 1857        Code Status History    Date Active Date Inactive Code Status Order ID Comments User Context   10/21/2017 2134 10/24/2017 2051 Full Code [id number]  Henreitta Leber, MD Inpatient   12/13/2015 1638 12/20/2015 1710 Full Code [id number]  Florene Glen, MD ED   06/04/2015 0215 06/06/2015 1632 Full Code [id number]  Marlyce Huge, MD Inpatient    Advance Directive Documentation     Most Recent Value  Type of Advance Directive  Healthcare Power of Attorney, Living will  Pre-existing  out of facility DNR order (yellow form or pink MOST form)  -  "MOST" Form in Place?  -      TOTAL TIME TAKING CARE OF THIS PATIENT: 43 minutes.   Note: This dictation was prepared with Dragon dictation along with smaller phrase technology. Any transcriptional errors that result from this process are  unintentional.   @[facility] @  on 03/08/2018 at 11:07 AM  Between 7am to 6pm - Pager - [phone]  After 6pm go to [email or web address] - password [email or web address] [facility]  [facility]  Office  [phone]  CC: Primary care physician; Adin Hector, MD

## 2018-03-08 NOTE — Discharge Instructions (Signed)
° °  Follow-up with primary care physician at the facility in 3 days Continue Foley catheter for better wound healing Continue wound care Reposition patient every 1-2 hours Palliative care to follow-up as an outpatient in 1 week   [facility] Stay Proper nutrition can help your body recover from illness and injury.   Foods and beverages high in protein, vitamins, and minerals help rebuild muscle loss, promote healing, & reduce fall risk.   In addition to eating healthy foods, a nutrition shake is an easy, delicious way to get the nutrition you need during and after your hospital stay  It is recommended that you continue to drink 2 bottles per day of:       Premier Protein Take one multivitamin with minerals daily Take Ocuvite multivitamin daily  Tips for adding a nutrition shake into your routine: As allowed, drink one with vitamins or medications instead of water or juice Enjoy one as a tasty mid-morning or afternoon snack Drink cold or make a milkshake out of it Drink one instead of milk with cereal or snacks Use as a coffee creamer   Available at the following grocery stores and pharmacies:           * [place] [phone]            For COUPONS visit: www.ensure.com/join or [email or web address]-[email or web address]   Suggested Substitutions Ensure Plus = Boost Plus = Carnation Breakfast Essentials = Boost Compact Ensure Active Clear = Boost Breeze Glucerna Shake = Boost Glucose Control = Carnation Breakfast Essentials SUGAR FREE

## 2018-03-08 NOTE — Progress Notes (Signed)
Report called to Kim at [facility].  EMS is here to transport the patient

## 2018-03-08 NOTE — Clinical Social Work Note (Signed)
Patient discharging today to [facility]. Patient's husband informed and patient to transport via EMS. Discharge information sent to [facility] and Tammy at [facility] is aware. Shela Leff MSW,LCSW [phone]

## 2018-03-16 ENCOUNTER — Ambulatory Visit (INDEPENDENT_AMBULATORY_CARE_PROVIDER_SITE_OTHER): Payer: Medicare Other | Admitting: Urology

## 2018-03-16 ENCOUNTER — Encounter: Payer: Self-pay | Admitting: Urology

## 2018-03-16 VITALS — BP 112/67 | HR 80

## 2018-03-16 DIAGNOSIS — R300 Dysuria: Secondary | ICD-10-CM

## 2018-03-16 NOTE — Progress Notes (Signed)
Catheter Removal  Patient is present today for a catheter removal.  68ml of water was drained from the balloon. A 16FR foley cath was removed from the bladder no complications were noted . Patient tolerated well.  Preformed by: Fonnie Jarvis, CMA  Simple Catheter Placement  Due to urinary retention patient is present today for a foley cath placement.  Patient was cleaned and prepped in a sterile fashion with betadine and lidocaine jelly 2% was instilled into the urethra.  A 16 FR foley catheter was inserted, urine return was noted  5ml, urine was clear in color.  The balloon was filled with 10cc of sterile water.  A night bag was attached for drainage. Patient was also given a night bag to take home and was given instruction on how to change from one bag to another.  Patient was given instruction on proper catheter care.  Patient tolerated well, no complications were noted   Preformed by: Fonnie Jarvis, CMA  Additional notes/ Follow up: patient did not tolerate ambulation. Patient has extensive wounds and pressure sores in thigh, buttocks and labial area. A lift was used for assistance but was not tolerated by the patient due to sores. Patient was unable to roll to side due to pain. Multiple assistants were utilized in moving patient still with much difficulty. Patient and husband states that she could bear wait but she was not able to at this visit.

## 2018-03-16 NOTE — Progress Notes (Signed)
   03/21/18  CC:  Chief Complaint  Patient presents with  . Cysto    HPI: 84 year old female seen 12/10/2017 for chronic dysuria.  In the interim she has had significant sacral decubitus and has an indwelling Foley catheter.  Blood pressure 112/67, pulse 80. NED. A&Ox3.   No respiratory distress   Abd soft, NT, ND Normal external genitalia with patent urethral meatus  Cystoscopy Procedure Note  Patient identification was confirmed, informed consent was obtained, and patient was prepped using Betadine solution.  Lidocaine jelly was administered per urethral meatus.    Preoperative abx where received prior to procedure.    Procedure: - Flexible cystoscope introduced, without any difficulty.   - Thorough search of the bladder revealed:    normal urethral meatus    normal urothelium with the exception of inflammatory changes bladder base secondary to indwelling Foley    no stones    no ulcers     no tumors    no urethral polyps    no trabeculation  - Ureteral orifices were normal in position and appearance.  Post-Procedure: - Patient tolerated the procedure well  Assessment/ Plan: No findings to explain patient's chronic dysuria however she has had an indwelling Foley for several weeks secondary to a sacral decubitus ulcer.  Her Foley catheter was replaced.  Follow-up as needed.   Abbie Sons, MD

## 2018-03-31 ENCOUNTER — Inpatient Hospital Stay
Admission: EM | Admit: 2018-03-31 | Discharge: 2018-04-04 | DRG: 698 | Disposition: A | Discharge status: Skilled Nursing Facility | Payer: Medicare Other | Source: Skilled Nursing Facility | Attending: Internal Medicine | Admitting: Internal Medicine

## 2018-03-31 ENCOUNTER — Other Ambulatory Visit: Payer: Self-pay

## 2018-03-31 ENCOUNTER — Encounter: Payer: Self-pay | Admitting: Emergency Medicine

## 2018-03-31 DIAGNOSIS — H409 Unspecified glaucoma: Secondary | ICD-10-CM | POA: Diagnosis present

## 2018-03-31 DIAGNOSIS — Z86718 Personal history of other venous thrombosis and embolism: Secondary | ICD-10-CM

## 2018-03-31 DIAGNOSIS — K219 Gastro-esophageal reflux disease without esophagitis: Secondary | ICD-10-CM | POA: Diagnosis present

## 2018-03-31 DIAGNOSIS — N179 Acute kidney failure, unspecified: Secondary | ICD-10-CM | POA: Diagnosis not present

## 2018-03-31 DIAGNOSIS — M1990 Unspecified osteoarthritis, unspecified site: Secondary | ICD-10-CM | POA: Diagnosis present

## 2018-03-31 DIAGNOSIS — Z79891 Long term (current) use of opiate analgesic: Secondary | ICD-10-CM

## 2018-03-31 DIAGNOSIS — Z85828 Personal history of other malignant neoplasm of skin: Secondary | ICD-10-CM

## 2018-03-31 DIAGNOSIS — Z794 Long term (current) use of insulin: Secondary | ICD-10-CM

## 2018-03-31 DIAGNOSIS — L89153 Pressure ulcer of sacral region, stage 3: Secondary | ICD-10-CM | POA: Diagnosis present

## 2018-03-31 DIAGNOSIS — Z7901 Long term (current) use of anticoagulants: Secondary | ICD-10-CM

## 2018-03-31 DIAGNOSIS — Z833 Family history of diabetes mellitus: Secondary | ICD-10-CM

## 2018-03-31 DIAGNOSIS — K436 Other and unspecified ventral hernia with obstruction, without gangrene: Secondary | ICD-10-CM | POA: Diagnosis present

## 2018-03-31 DIAGNOSIS — Y846 Urinary catheterization as the cause of abnormal reaction of the patient, or of later complication, without mention of misadventure at the time of the procedure: Secondary | ICD-10-CM | POA: Diagnosis present

## 2018-03-31 DIAGNOSIS — Z8249 Family history of ischemic heart disease and other diseases of the circulatory system: Secondary | ICD-10-CM | POA: Diagnosis not present

## 2018-03-31 DIAGNOSIS — G9341 Metabolic encephalopathy: Secondary | ICD-10-CM | POA: Diagnosis present

## 2018-03-31 DIAGNOSIS — Z8711 Personal history of peptic ulcer disease: Secondary | ICD-10-CM | POA: Diagnosis not present

## 2018-03-31 DIAGNOSIS — E860 Dehydration: Secondary | ICD-10-CM | POA: Diagnosis present

## 2018-03-31 DIAGNOSIS — I10 Essential (primary) hypertension: Secondary | ICD-10-CM | POA: Diagnosis present

## 2018-03-31 DIAGNOSIS — E875 Hyperkalemia: Secondary | ICD-10-CM | POA: Diagnosis present

## 2018-03-31 DIAGNOSIS — Z923 Personal history of irradiation: Secondary | ICD-10-CM | POA: Diagnosis not present

## 2018-03-31 DIAGNOSIS — E1122 Type 2 diabetes mellitus with diabetic chronic kidney disease: Secondary | ICD-10-CM | POA: Diagnosis present

## 2018-03-31 DIAGNOSIS — Z9049 Acquired absence of other specified parts of digestive tract: Secondary | ICD-10-CM

## 2018-03-31 DIAGNOSIS — L8990 Pressure ulcer of unspecified site, unspecified stage: Secondary | ICD-10-CM

## 2018-03-31 DIAGNOSIS — Z853 Personal history of malignant neoplasm of breast: Secondary | ICD-10-CM

## 2018-03-31 DIAGNOSIS — I82409 Acute embolism and thrombosis of unspecified deep veins of unspecified lower extremity: Secondary | ICD-10-CM | POA: Diagnosis present

## 2018-03-31 DIAGNOSIS — Z7982 Long term (current) use of aspirin: Secondary | ICD-10-CM

## 2018-03-31 DIAGNOSIS — Z888 Allergy status to other drugs, medicaments and biological substances status: Secondary | ICD-10-CM | POA: Diagnosis not present

## 2018-03-31 DIAGNOSIS — I129 Hypertensive chronic kidney disease with stage 1 through stage 4 chronic kidney disease, or unspecified chronic kidney disease: Secondary | ICD-10-CM | POA: Diagnosis present

## 2018-03-31 DIAGNOSIS — T83511A Infection and inflammatory reaction due to indwelling urethral catheter, initial encounter: Principal | ICD-10-CM | POA: Diagnosis present

## 2018-03-31 DIAGNOSIS — L89892 Pressure ulcer of other site, stage 2: Secondary | ICD-10-CM | POA: Diagnosis present

## 2018-03-31 DIAGNOSIS — Z880 Allergy status to penicillin: Secondary | ICD-10-CM

## 2018-03-31 DIAGNOSIS — Z885 Allergy status to narcotic agent status: Secondary | ICD-10-CM | POA: Diagnosis not present

## 2018-03-31 DIAGNOSIS — R778 Other specified abnormalities of plasma proteins: Secondary | ICD-10-CM

## 2018-03-31 DIAGNOSIS — Z515 Encounter for palliative care: Secondary | ICD-10-CM | POA: Diagnosis not present

## 2018-03-31 DIAGNOSIS — Z7189 Other specified counseling: Secondary | ICD-10-CM | POA: Diagnosis not present

## 2018-03-31 DIAGNOSIS — E1165 Type 2 diabetes mellitus with hyperglycemia: Secondary | ICD-10-CM | POA: Diagnosis present

## 2018-03-31 DIAGNOSIS — R739 Hyperglycemia, unspecified: Secondary | ICD-10-CM

## 2018-03-31 DIAGNOSIS — N390 Urinary tract infection, site not specified: Secondary | ICD-10-CM | POA: Diagnosis present

## 2018-03-31 DIAGNOSIS — Z79899 Other long term (current) drug therapy: Secondary | ICD-10-CM

## 2018-03-31 DIAGNOSIS — M810 Age-related osteoporosis without current pathological fracture: Secondary | ICD-10-CM | POA: Diagnosis present

## 2018-03-31 DIAGNOSIS — Z7401 Bed confinement status: Secondary | ICD-10-CM

## 2018-03-31 DIAGNOSIS — Z96659 Presence of unspecified artificial knee joint: Secondary | ICD-10-CM | POA: Diagnosis present

## 2018-03-31 DIAGNOSIS — N182 Chronic kidney disease, stage 2 (mild): Secondary | ICD-10-CM | POA: Diagnosis present

## 2018-03-31 DIAGNOSIS — R7989 Other specified abnormal findings of blood chemistry: Secondary | ICD-10-CM | POA: Diagnosis present

## 2018-03-31 LAB — CBC
HCT: 36 % (ref 35.0–47.0)
Hemoglobin: 12.5 g/dL (ref 12.0–16.0)
MCH: 32.2 pg (ref 26.0–34.0)
MCHC: 34.7 g/dL (ref 32.0–36.0)
MCV: 92.6 fL (ref 80.0–100.0)
Platelets: 322 10*3/uL (ref 150–440)
RBC: 3.88 MIL/uL (ref 3.80–5.20)
RDW: 16.4 % — ABNORMAL HIGH (ref 11.5–14.5)
WBC: 18.8 10*3/uL — AB (ref 3.6–11.0)

## 2018-03-31 LAB — GLUCOSE, CAPILLARY: GLUCOSE-CAPILLARY: 222 mg/dL — AB (ref 70–99)

## 2018-03-31 LAB — BASIC METABOLIC PANEL
Anion gap: 13 (ref 5–15)
BUN: 120 mg/dL — AB (ref 8–23)
CALCIUM: 9.6 mg/dL (ref 8.9–10.3)
CHLORIDE: 103 mmol/L (ref 98–111)
CO2: 21 mmol/L — ABNORMAL LOW (ref 22–32)
CREATININE: 3.76 mg/dL — AB (ref 0.44–1.00)
GFR, EST AFRICAN AMERICAN: 12 mL/min — AB (ref >60)
GFR, EST NON AFRICAN AMERICAN: 10 mL/min — AB (ref >60)
Glucose, Bld: 253 mg/dL — ABNORMAL HIGH (ref 70–99)
Potassium: 6.3 mmol/L (ref 3.5–5.1)
SODIUM: 137 mmol/L (ref 135–145)

## 2018-03-31 LAB — URINALYSIS, COMPLETE (UACMP) WITH MICROSCOPIC
Bacteria, UA: NONE SEEN
Bilirubin Urine: NEGATIVE
GLUCOSE, UA: NEGATIVE mg/dL
KETONES UR: NEGATIVE mg/dL
NITRITE: NEGATIVE
PROTEIN: 100 mg/dL — AB
Specific Gravity, Urine: 1.019 (ref 1.005–1.030)
Squamous Epithelial / LPF: NONE SEEN (ref 0–5)
pH: 5 (ref 5.0–8.0)

## 2018-03-31 LAB — HEPATIC FUNCTION PANEL
ALT: 49 U/L — ABNORMAL HIGH (ref 0–44)
AST: 65 U/L — ABNORMAL HIGH (ref 15–41)
Albumin: 2.9 g/dL — ABNORMAL LOW (ref 3.5–5.0)
Alkaline Phosphatase: 122 U/L (ref 38–126)
BILIRUBIN TOTAL: 0.6 mg/dL (ref 0.3–1.2)
Bilirubin, Direct: 0.3 mg/dL — ABNORMAL HIGH (ref 0.0–0.2)
Indirect Bilirubin: 0.3 mg/dL (ref 0.3–0.9)
TOTAL PROTEIN: 7.9 g/dL (ref 6.5–8.1)

## 2018-03-31 LAB — TROPONIN I
TROPONIN I: 0.05 ng/mL — AB (ref <0.03)
TROPONIN I: 0.05 ng/mL — AB (ref <0.03)

## 2018-03-31 MED ORDER — SODIUM CHLORIDE 0.9 % IV SOLN
1.0000 g | Freq: Once | INTRAVENOUS | Status: AC
  Administered 2018-03-31: 1 g via INTRAVENOUS
  Filled 2018-03-31: qty 10

## 2018-03-31 MED ORDER — CLONAZEPAM 0.125 MG PO TBDP
0.1250 mg | ORAL_TABLET | Freq: Two times a day (BID) | ORAL | Status: DC
  Administered 2018-04-01 – 2018-04-04 (×6): 0.125 mg via ORAL
  Filled 2018-03-31 (×7): qty 1

## 2018-03-31 MED ORDER — DULOXETINE HCL 30 MG PO CPEP
90.0000 mg | ORAL_CAPSULE | Freq: Every day | ORAL | Status: DC
  Administered 2018-04-01 – 2018-04-04 (×4): 90 mg via ORAL
  Filled 2018-03-31 (×4): qty 3

## 2018-03-31 MED ORDER — LATANOPROST 0.005 % OP SOLN
1.0000 [drp] | Freq: Every day | OPHTHALMIC | Status: DC
  Administered 2018-04-01 – 2018-04-03 (×3): 1 [drp] via OPHTHALMIC
  Filled 2018-03-31: qty 2.5

## 2018-03-31 MED ORDER — ACETAMINOPHEN 650 MG RE SUPP: 650.0000 mg | Freq: Four times a day (QID) | RECTAL | Status: DC | PRN

## 2018-03-31 MED ORDER — ONDANSETRON HCL 4 MG PO TABS: 4.0000 mg | ORAL_TABLET | Freq: Four times a day (QID) | ORAL | Status: DC | PRN

## 2018-03-31 MED ORDER — PATIROMER SORBITEX CALCIUM 8.4 G PO PACK
8.4000 g | PACK | Freq: Once | ORAL | Status: AC
  Administered 2018-04-01: 8.4 g via ORAL
  Filled 2018-03-31: qty 1

## 2018-03-31 MED ORDER — PANTOPRAZOLE SODIUM 40 MG PO TBEC
40.0000 mg | DELAYED_RELEASE_TABLET | Freq: Every day | ORAL | Status: DC
  Administered 2018-04-01 – 2018-04-04 (×4): 40 mg via ORAL
  Filled 2018-03-31 (×4): qty 1

## 2018-03-31 MED ORDER — POLYETHYLENE GLYCOL 3350 17 G PO PACK: 17.0000 g | PACK | Freq: Every day | ORAL | Status: DC | PRN

## 2018-03-31 MED ORDER — INSULIN ASPART 100 UNIT/ML ~~LOC~~ SOLN
10.0000 [IU] | Freq: Once | SUBCUTANEOUS | Status: AC
  Administered 2018-03-31: 10 [IU] via INTRAVENOUS
  Filled 2018-03-31: qty 1

## 2018-03-31 MED ORDER — INSULIN ASPART 100 UNIT/ML ~~LOC~~ SOLN
0.0000 [IU] | Freq: Three times a day (TID) | SUBCUTANEOUS | Status: DC
  Administered 2018-04-01: 2 [IU] via SUBCUTANEOUS
  Administered 2018-04-01: 3 [IU] via SUBCUTANEOUS
  Administered 2018-04-01: 2 [IU] via SUBCUTANEOUS
  Administered 2018-04-01: 3 [IU] via SUBCUTANEOUS
  Administered 2018-04-02: 2 [IU] via SUBCUTANEOUS
  Administered 2018-04-02: 1 [IU] via SUBCUTANEOUS
  Administered 2018-04-02 – 2018-04-03 (×4): 2 [IU] via SUBCUTANEOUS
  Administered 2018-04-03: 1 [IU] via SUBCUTANEOUS
  Administered 2018-04-03 – 2018-04-04 (×2): 3 [IU] via SUBCUTANEOUS
  Administered 2018-04-04: 1 [IU] via SUBCUTANEOUS
  Filled 2018-03-31 (×14): qty 1

## 2018-03-31 MED ORDER — SODIUM CHLORIDE 0.9 % IV SOLN
1.0000 g | INTRAVENOUS | Status: DC
  Administered 2018-04-01 – 2018-04-03 (×3): 1 g via INTRAVENOUS
  Filled 2018-03-31: qty 10
  Filled 2018-03-31 (×3): qty 1

## 2018-03-31 MED ORDER — ASPIRIN EC 81 MG PO TBEC
81.0000 mg | DELAYED_RELEASE_TABLET | Freq: Every evening | ORAL | Status: DC
  Administered 2018-04-01 – 2018-04-03 (×3): 81 mg via ORAL
  Filled 2018-03-31 (×3): qty 1

## 2018-03-31 MED ORDER — ONDANSETRON HCL 4 MG/2ML IJ SOLN: 4.0000 mg | Freq: Four times a day (QID) | INTRAMUSCULAR | Status: DC | PRN

## 2018-03-31 MED ORDER — DEXTROSE 50 % IV SOLN
1.0000 | Freq: Once | INTRAVENOUS | Status: AC
  Administered 2018-03-31: 50 mL via INTRAVENOUS
  Filled 2018-03-31: qty 50

## 2018-03-31 MED ORDER — SODIUM CHLORIDE 0.9 % IV SOLN
INTRAVENOUS | Status: AC
  Administered 2018-03-31: via INTRAVENOUS

## 2018-03-31 MED ORDER — ACETAMINOPHEN 325 MG PO TABS
650.0000 mg | ORAL_TABLET | Freq: Four times a day (QID) | ORAL | Status: DC | PRN
  Administered 2018-04-03: 650 mg via ORAL
  Filled 2018-03-31 (×2): qty 2

## 2018-03-31 MED ORDER — IPRATROPIUM-ALBUTEROL 0.5-2.5 (3) MG/3ML IN SOLN
3.0000 mL | Freq: Once | RESPIRATORY_TRACT | Status: AC
  Administered 2018-03-31: 3 mL via RESPIRATORY_TRACT
  Filled 2018-03-31: qty 3

## 2018-03-31 MED ORDER — SODIUM CHLORIDE 0.9 % IV BOLUS
1000.0000 mL | Freq: Once | INTRAVENOUS | Status: AC
  Administered 2018-03-31: 1000 mL via INTRAVENOUS

## 2018-03-31 NOTE — ED Notes (Signed)
Potassium from [facility] was 6.3 this morning at 11:06 per records sent from [facility]

## 2018-03-31 NOTE — H&P (Signed)
[facility] NAME: [name]    MR#:  [id number]  DATE OF BIRTH:  21-Nov-1928  DATE OF ADMISSION:  03/31/2018  PRIMARY CARE PHYSICIAN: Adin Hector, MD   REQUESTING/REFERRING PHYSICIAN: Clearnce Hasten, MD  CHIEF COMPLAINT:   Chief Complaint  Patient presents with  . Weakness  . Hyperkalemia    HISTORY OF PRESENT ILLNESS:  [name]  is a 84 y.o. female who presents with chief complaint as above.  Patient is unable to contribute to her history due to acute encephalopathy from her UTI.  History is taken from her husband who is at bedside.  He states that she has been at the nursing facility for the past 3 weeks, and has been steadily declining over that time.  He states that her appetite has been poor, and she has had significantly decreased p.o. intake.  She was brought tonight for evaluation due to lab work that was done, and she was found to have elevated potassium as well as creatinine.  Labs here in the ED confirmed the same.  Patient is clinically very dehydrated.  Hospitalist were called for admission  PAST MEDICAL HISTORY:   Past Medical History:  Diagnosis Date  . Arthritis   . Basal cell carcinoma 2019   LLE  . Breast cancer ([place]) 2014   right- radiation. Dr Pat Patrick  . Diabetes mellitus without complication ([place])   . Gastric ulcer   . Glaucoma   . Hiatal hernia   . Hypertension   . Osteoporosis   . Recurrent deep vein thrombosis (DVT) ([place])      PAST SURGICAL HISTORY:   Past Surgical History:  Procedure Laterality Date  . APPENDECTOMY    . BREAST BIOPSY Right 2014  . COLONOSCOPY  2013  . PARTIAL COLECTOMY     pre cancerous polyps/ over 10 years ago  . REPLACEMENT TOTAL KNEE    . TONSILLECTOMY    . VENTRAL HERNIA REPAIR N/A 06/04/2015   Procedure:  REPAIR of INCARCERATED VENTRAL HERNIA;  Surgeon: Marlyce Huge, MD;  Location: [facility] ORS;  Service: General;  Laterality: N/A;  . VENTRAL HERNIA  REPAIR N/A 12/18/2015   Procedure: repair of recurent incarcerated ventral hernia with mesh;  Surgeon: Florene Glen, MD;  Location: [facility] ORS;  Service: General;  Laterality: N/A;     SOCIAL HISTORY:   Social History   Tobacco Use  . Smoking status: Never Smoker  . Smokeless tobacco: Never Used  Substance Use Topics  . Alcohol use: No    Alcohol/week: 0.0 standard drinks     FAMILY HISTORY:   Family History  Problem Relation Age of Onset  . Diabetes Mother   . Hypertension Mother   . Deep vein thrombosis Mother   . Heart attack Father   . Breast cancer Neg Hx      DRUG ALLERGIES:   Allergies  Allergen Reactions  . Metformin Diarrhea  . Oxycodone-Acetaminophen Nausea And Vomiting  . Oxycodone-Acetaminophen Nausea Only  . Amoxicillin Rash and Other (See Comments)    Has patient had a PCN reaction causing immediate rash, facial/tongue/throat swelling, SOB or lightheadedness with hypotension: No Has patient had a PCN reaction causing severe rash involving mucus membranes or skin necrosis: No Has patient had a PCN reaction that required hospitalization No Has patient had a PCN reaction occurring within the last 10 years: No If all of the above answers are "NO", then may proceed with Cephalosporin use.  MEDICATIONS AT HOME:   Prior to Admission medications   Medication Sig Start Date End Date Taking? Authorizing Provider  allopurinol (ZYLOPRIM) 100 MG tablet Take 200 mg by mouth daily.    Yes [provider]  ascorbic acid (VITAMIN C) 500 MG tablet Take 1,000 mg by mouth daily.    Yes [provider]  aspirin EC 81 MG tablet Take 81 mg by mouth every evening.   Yes [provider]  atenolol (TENORMIN) 25 MG tablet Take 25 mg by mouth every evening.    Yes [provider]  bisacodyl (BISACODYL) 5 MG EC tablet Take 5 mg by mouth daily as needed for moderate constipation.   Yes [provider]  Calcium Carbonate-Vitamin D  (OYSTER SHELL CALCIUM 500 + D) 500-125 MG-UNIT TABS Take 1 tablet by mouth daily.    Yes [provider]  cholecalciferol (VITAMIN D) 1000 UNITS tablet Take 1,000 Units by mouth daily.   Yes [provider]  clonazepam (KLONOPIN) 0.125 MG disintegrating tablet Take 0.125 mg by mouth 2 (two) times daily.   Yes [provider]  collagenase (SANTYL) ointment Apply 1 application topically 2 (two) times daily. Pressure ulcer of sacral region   Yes [provider]  DULoxetine (CYMBALTA) 30 MG capsule Take 90 mg by mouth daily.   Yes [provider]  folic acid (FOLVITE) 1 MG tablet Take 1 tablet by mouth every evening.    Yes [provider]  insulin aspart (NOVOLOG) 100 UNIT/ML injection Inject 0-9 Units into the skin 3 (three) times daily with meals. CBG < 70: implement hypoglycemia protocol CBG 70 - 120: 0 units CBG 121 - 150: 1 unit CBG 151 - 200: 2 units CBG 201 - 250: 3 units CBG 251 - 300: 5 units CBG 301 - 350: 7 units CBG 351 - 400: 9 units CBG > 400: call MD and obtain STAT lab verification 03/08/18  Yes Gouru, Aruna, MD  insulin aspart (NOVOLOG) 100 UNIT/ML injection Inject 0-5 Units into the skin at bedtime. CBG < 70: implement hypoglycemia protocol CBG 70 - 120: 0 units CBG 121 - 150: 0 units CBG 151 - 200: 0 units CBG 201 - 250: 2 units CBG 251 - 300: 3 units CBG 301 - 350: 4 units CBG 351 - 400: 5 units CBG > 400: call MD and obtain STAT lab verification 03/08/18  Yes Gouru, Aruna, MD  ketoconazole (NIZORAL) 2 % cream Apply 1 application topically 2 (two) times daily. 10/24/17  Yes Fritzi Mandes, MD  latanoprost (XALATAN) 0.005 % ophthalmic solution Place 1 drop into both eyes at bedtime.    Yes [provider]  lisinopril (PRINIVIL,ZESTRIL) 30 MG tablet Take 30 mg by mouth 2 (two) times daily.   Yes [provider]  Misc Natural Products (OSTEO BI-FLEX ADV DOUBLE ST PO) Take 1 tablet by mouth daily.    Yes  [provider]  modafinil (PROVIGIL) 100 MG tablet Take 100 mg by mouth 2 (two) times daily.   Yes [provider]  Multiple Vitamin (MULTIVITAMIN WITH MINERALS) TABS tablet Take 1 tablet by mouth daily. 03/09/18  Yes Gouru, Illene Silver, MD  multivitamin-lutein (OCUVITE-LUTEIN) CAPS capsule Take 1 capsule by mouth at bedtime. 03/08/18  Yes Gouru, Illene Silver, MD  omeprazole (PRILOSEC) 20 MG capsule Take 20 mg by mouth daily as needed (GERD).    Yes [provider]  ondansetron (ZOFRAN) 4 MG tablet Take 1 tablet (4 mg total) by mouth every 6 (six)  hours as needed for nausea. 03/08/18  Yes Gouru, Illene Silver, MD  polyethylene glycol (MIRALAX / GLYCOLAX) packet Take 17 g by mouth daily as needed for mild constipation. Patient taking differently: Take 17 g by mouth daily.  03/08/18  Yes Gouru, Illene Silver, MD  protein supplement shake (PREMIER PROTEIN) LIQD Take 325 mLs (11 oz total) by mouth 2 (two) times daily between meals. 03/08/18  Yes Gouru, Illene Silver, MD  rivaroxaban (XARELTO) 20 MG TABS tablet Take 20 mg by mouth every morning.    Yes [provider]  sitaGLIPtin (JANUVIA) 100 MG tablet Take 100 mg by mouth every evening.    Yes [provider]  acetaminophen (TYLENOL) 325 MG tablet Take 2 tablets (650 mg total) by mouth every 6 (six) hours as needed for mild pain (or Fever >/= 101). 03/08/18   Nicholes Mango, MD  nystatin (MYCOSTATIN/NYSTOP) powder Apply topically 2 (two) times daily. Patient not taking: Reported on 03/31/2018 03/08/18   Nicholes Mango, MD  traMADol (ULTRAM) 50 MG tablet Take 1 tablet (50 mg total) by mouth every 6 (six) hours as needed for moderate pain. 03/08/18   Nicholes Mango, MD    REVIEW OF SYSTEMS:  Review of Systems  Unable to perform ROS: Acuity of condition     VITAL SIGNS:   Vitals:   03/31/18 1945 03/31/18 2000 03/31/18 2100 03/31/18 2130  BP:  (!) 146/53 (!) 109/49 (!) 114/44  Pulse: 89 91    Resp: (!) 24 (!) 24 (!) 21 (!) 23  Temp:      TempSrc:       SpO2: 96% 100%    Weight:      Height:       Wt Readings from Last 3 Encounters:  03/31/18 99.1 kg  03/04/18 113 kg  10/21/17 113.4 kg    PHYSICAL EXAMINATION:  Physical Exam  Vitals reviewed. Constitutional: She appears well-developed and well-nourished. No distress.  HENT:  Head: Normocephalic and atraumatic.  Dry mucous membranes  Eyes: Pupils are equal, round, and reactive to light. Conjunctivae and EOM are normal. No scleral icterus.  Neck: Normal range of motion. Neck supple. No JVD present. No thyromegaly present.  Cardiovascular: Normal rate, regular rhythm and intact distal pulses. Exam reveals no gallop and no friction rub.  No murmur heard. Respiratory: Effort normal and breath sounds normal. No respiratory distress. She has no wheezes. She has no rales.  GI: Soft. Bowel sounds are normal. She exhibits no distension. There is no tenderness.  Musculoskeletal: Normal range of motion. She exhibits no edema.  No arthritis, no gout  Lymphadenopathy:    She has no cervical adenopathy.  Neurological:  Unable to assess due to patient's condition  Skin: Skin is warm and dry. No rash noted. No erythema.  Psychiatric:  Unable to assess due to patient's condition    LABORATORY PANEL:   CBC Recent Labs  Lab 03/31/18 1824  WBC 18.8*  HGB 12.5  HCT 36.0  PLT 322   ------------------------------------------------------------------------------------------------------------------  Chemistries  Recent Labs  Lab 03/31/18 1824  NA 137  K 6.3*  CL 103  CO2 21*  GLUCOSE 253*  BUN 120*  CREATININE 3.76*  CALCIUM 9.6  AST 65*  ALT 49*  ALKPHOS 122  BILITOT 0.6   ------------------------------------------------------------------------------------------------------------------  Cardiac Enzymes Recent Labs  Lab 03/31/18 1824  TROPONINI 0.05*    ------------------------------------------------------------------------------------------------------------------  RADIOLOGY:  No results found.  EKG:   Orders placed or performed during the hospital encounter of 03/31/18  . ED  EKG  . ED EKG  . EKG 12-Lead  . EKG 12-Lead  . EKG 12-Lead  . EKG 12-Lead    IMPRESSION AND PLAN:  Principal Problem:   UTI (urinary tract infection) -IV antibiotics initiated, urine culture sent Active Problems:   Acute encephalopathy -due to her UTI, treatment as above, expect improvement with treatment of infection   Hyperkalemia -patient was given IV calcium, insulin with D50, and [place] in the ED.  Recheck her potassium tonight and treat with further medications as needed.   Essential (primary) hypertension -stable, continue home meds   Deep vein thrombosis (DVT) of lower extremity ([place]) -patient is on Xarelto, but with current renal function I will hold this medication tonight.  Will not bridge with heparin given that she still likely has active unexcreted Xarelto still in her system.  She can be restarted on her normal dose of Xarelto once her renal function improves.  If she is slow to improve her renal function, bridging with heparin could be considered at that time   Acid reflux -Home dose PPI  Chart review performed and case discussed with ED provider. Labs, imaging and/or ECG reviewed by provider and discussed with patient/family. Management plans discussed with the patient and/or family.  DVT PROPHYLAXIS: Systemic anticoagulation  GI PROPHYLAXIS:  PPI   ADMISSION STATUS: Inpatient     CODE STATUS: fULL Code Status History    Date Active Date Inactive Code Status Order ID Comments User Context   03/04/2018 1857 03/08/2018 1849 Full Code [id number]  Sela Hua, MD Inpatient   10/21/2017 2134 10/24/2017 2051 Full Code [id number]  Henreitta Leber, MD Inpatient   12/13/2015 1638 12/20/2015 1710 Full Code [id number]  Florene Glen, MD ED    06/04/2015 0215 06/06/2015 1632 Full Code [id number]  Marlyce Huge, MD Inpatient    Advance Directive Documentation     Most Recent Value  Type of Advance Directive  Out of facility DNR (pink MOST or yellow form), Healthcare Power of Attorney  Pre-existing out of facility DNR order (yellow form or pink MOST form)  Pink MOST form placed in chart (order not valid for inpatient use)  "MOST" Form in Place?  -      TOTAL TIME TAKING CARE OF THIS PATIENT: 45 minutes.   Jannifer Franklin, Jasneet Schobert [facility] 03/31/2018, 10:01 PM  [facility]  Office  [phone]  CC: Primary care physician; Adin Hector, MD  Note:  This document was prepared using Dragon voice recognition software and may include unintentional dictation errors.

## 2018-03-31 NOTE — ED Notes (Signed)
Pt has foley catheter in place, urine is cloudy and thick in appearance.  Clamped at this time to attempt to obtain urine sample.  Husband at bedside states pt has had recent UTIs.

## 2018-03-31 NOTE — ED Notes (Signed)
Lab called to draw one set of blood cultures

## 2018-03-31 NOTE — ED Provider Notes (Addendum)
[facility] Emergency Department Provider Note  ____________________________________________  Time seen: Approximately 6:24 PM  I have reviewed the triage vital signs and the nursing notes.   HISTORY  Chief Complaint Weakness and Hyperkalemia    HPI [name] is a 84 y.o. female with a history of HTN, DM, DVT on Xarelto, presenting for hyperkalemia.  The patient has been at [facility] and has been "going downhill" according to her husband.  She has not been eating and drinking, has been more somnolent, and has not been as active as usual.  Today, she went for routine blood testing and was told to come to the emergency department for new renal insufficiency with a creatinine of 3.2 and hyperkalemia with a potassium of 6.3.  The patient is unable to give history, she is somnolent, and the history is obtained from the paperwork from [facility] as well as her spouse.  Past Medical History:  Diagnosis Date  . Arthritis   . Basal cell carcinoma 2019   LLE  . Breast cancer ([place]) 2014   right- radiation. Dr Pat Patrick  . Diabetes mellitus without complication ([place])   . Gastric ulcer   . Glaucoma   . Hiatal hernia   . Hypertension   . Osteoporosis   . Recurrent deep vein thrombosis (DVT) [facility])     Patient Active Problem List   Diagnosis Date Noted  . Sacral pressure ulcer 03/04/2018  . Coagulopathy ([place]) 10/23/2017  . Left knee pain 10/21/2017  . Decubitus ulcer of sacral region, stage 1 08/12/2017  . Recurrent ventral hernia 07/30/2017  . External hemorrhoids 05/19/2016  . Recurrent ventral hernia with incarceration 12/13/2015  . Abdominal hernia 12/13/2015  . Obesity (BMI 35.0-39.9 without comorbidity)   . Essential (primary) hypertension 12/04/2015  . Uncontrolled type 2 diabetes mellitus with hyperglycemia, without long-term current use of insulin ([place]) 12/04/2015  . Body mass index (BMI) of 40.0-44.9 in adult ([place]) 11/27/2015  . Aftercare  following surgery 06/20/2015  . Type 2 diabetes mellitus ([place]) 06/18/2015  . Deep vein thrombosis (DVT) of lower extremity ([place]) 06/18/2015  . Acid reflux 06/18/2015  . Arthritis urica 06/18/2015  . BP (high blood pressure) 06/18/2015  . Arthritis, degenerative 06/18/2015  . Osteoporosis, post-menopausal 06/18/2015  . Incarcerated ventral hernia 06/04/2015  . Umbilical hernia, incarcerated   . Adenocarcinoma of right breast ([place]) 12/14/2014  . Malignant neoplasm of female breast ([place]) 12/14/2014  . Carcinoma of breast ([place]) 12/14/2014  . Idiopathic localized osteoarthropathy 09/25/2011    Past Surgical History:  Procedure Laterality Date  . APPENDECTOMY    . BREAST BIOPSY Right 2014  . COLONOSCOPY  2013  . PARTIAL COLECTOMY     pre cancerous polyps/ over 10 years ago  . REPLACEMENT TOTAL KNEE    . TONSILLECTOMY    . VENTRAL HERNIA REPAIR N/A 06/04/2015   Procedure:  REPAIR of INCARCERATED VENTRAL HERNIA;  Surgeon: Marlyce Huge, MD;  Location: [facility] ORS;  Service: General;  Laterality: N/A;  . VENTRAL HERNIA REPAIR N/A 12/18/2015   Procedure: repair of recurent incarcerated ventral hernia with mesh;  Surgeon: Florene Glen, MD;  Location: [facility] ORS;  Service: General;  Laterality: N/A;    Current Outpatient Rx  . Order #: [id number] Class: No Print  . Order #: [id number] Class: Historical Med  . Order #: [id number] Class: Historical Med  . Order #: [id number] Class: Historical Med  . Order #: [id number] Class: Historical Med  . Order #: [id number] Class: Historical Med  . Order #:  [id number] Class: Historical Med  . Order #: [id number] Class: Historical Med  . Order #: [id number] Class: Historical Med  . Order #: [id number] Class: Print  . Order #: [id number] Class: No Print  . Order #: [id number] Class: Normal  . Order #: [id number] Class: Historical Med  . Order #: [id number] Class: Historical Med  . Order #: [id number] Class: Historical Med  . Order #: [id number] Class: No Print  . Order #:  [id number] Class: No Print  . Order #: [id number] Class: Print  . Order #: [id number] Class: Historical Med  . Order #: [id number] Class: Print  . Order #: [id number] Class: Print  . Order #: [id number] Class: No Print  . Order #: [id number] Class: Historical Med  . Order #: [id number] Class: Historical Med  . Order #: [id number] Class: Print    Allergies Metformin; Oxycodone-acetaminophen; Oxycodone-acetaminophen; and Amoxicillin  Family History  Problem Relation Age of Onset  . Diabetes Mother   . Hypertension Mother   . Deep vein thrombosis Mother   . Heart attack Father   . Breast cancer Neg Hx     Social History Social History   Tobacco Use  . Smoking status: Never Smoker  . Smokeless tobacco: Never Used  Substance Use Topics  . Alcohol use: No    Alcohol/week: 0.0 standard drinks  . Drug use: No    Review of Systems Constitutional: No fever/chills. Eyes: No visual changes. ENT: No sore throat. No congestion or rhinorrhea. Cardiovascular: Denies chest pain. Denies palpitations. Respiratory: Denies shortness of breath.  No cough. Gastrointestinal: Chronic unchanged ventral hernia that gives her occasional pain.  Decreased p.o. intake.  No nausea, no vomiting.  No diarrhea.  No constipation. Genitourinary: Negative for dysuria.  Indwelling Foley catheter with decreased output that is thick and purulent. Musculoskeletal: Negative for back pain. Skin: Negative for rash. Neurological: Negative for headaches. No focal numbness, tingling or weakness.     ____________________________________________   PHYSICAL EXAM:  VITAL SIGNS: ED Triage Vitals  Enc Vitals Group     BP 03/31/18 1807 (!) 141/49     Pulse Rate 03/31/18 1807 96     Resp 03/31/18 1807 (!) 26     Temp 03/31/18 1807 97.7 F (36.5 C)     Temp Source 03/31/18 1807 Oral     SpO2 03/31/18 1807 99 %     Weight 03/31/18 1809 218 lb 6.4 oz (99.1 kg)     Height 03/31/18 1809 5\' 4"  (1.626 m)     Head Circumference --       [facility] --      Pain Score --      Pain Loc --      Pain Edu? --      Excl. in [place]? --     Constitutional: The patient is alert but mostly somnolent. Eyes: Conjunctivae are normal.  EOMI. No scleral icterus. Head: Atraumatic. Nose: No congestion/rhinnorhea. Mouth/Throat: Mucous membranes are very dry.  Neck: No stridor.  Supple.  No JVD.  No meningismus. Cardiovascular: Normal rate, regular rhythm. No murmurs, rubs or gallops.  Respiratory: Normal respiratory effort.  No accessory muscle use or retractions. Lungs CTAB.  No wheezes, rales or ronchi. Gastrointestinal: Morbidly obese.  The patient has a central mildly right-sided ventral hernia just below the umbilicus that is mildly tender to palpation but reducible..  Soft, and mildly distended.  No guarding or rebound.  No peritoneal signs. Genitourinary: The patient has an indwelling Foley catheter that has thick purulent urine in it. Musculoskeletal: Bilateral symmetric lower extremity edema. No ttp in the calves or palpable  cords.  Negative Homan's sign. Neurologic: somnolent but arouses to voice.Marland Kitchen  Speech is clear.  Face and smile are symmetric.  EOMI.  Moves all extremities well. Skin:  Skin is warm, dry and intact. No rash noted. Psychiatric: Mood and affect are normal. ____________________________________________   LABS (all labs ordered are listed, but only abnormal results are displayed)  Labs Reviewed  BASIC METABOLIC PANEL - Abnormal; Notable for the following components:      Result Value   Potassium 6.3 (*)    CO2 21 (*)    Glucose, Bld 253 (*)    BUN 120 (*)    Creatinine, Ser 3.76 (*)    GFR calc non Af Amer 10 (*)    GFR calc Af Amer 12 (*)    All other components within normal limits  CBC - Abnormal; Notable for the following components:   WBC 18.8 (*)    RDW 16.4 (*)    All other components within normal limits  HEPATIC FUNCTION PANEL - Abnormal; Notable for the following components:   Albumin 2.9 (*)     AST 65 (*)    ALT 49 (*)    Bilirubin, Direct 0.3 (*)    All other components within normal limits  TROPONIN I - Abnormal; Notable for the following components:   Troponin I 0.05 (*)    All other components within normal limits  CULTURE, BLOOD (ROUTINE X 2)  CULTURE, BLOOD (ROUTINE X 2)  URINALYSIS, COMPLETE (UACMP) WITH MICROSCOPIC   ____________________________________________  EKG  EKG, timed 1812, with a heart rate of 93, poor baseline tracing, normal axis, normal intervals, sinus rhythm, no STEMI or ischemic changes.  ____________________________________________  RADIOLOGY  No results found.  ____________________________________________   PROCEDURES  Procedure(s) performed: None  Procedures  Critical Care performed: Yes ____________________________________________   INITIAL IMPRESSION / ASSESSMENT AND PLAN / ED COURSE  Pertinent labs & imaging results that were available during my care of the patient were reviewed by me and considered in my medical decision making (see chart for details).  84 y.o. female with multiple chronic comorbidities living in a rehab facility with a downgoing course over the last several days to weeks according to her husband presenting for hyperkalemia and renal insufficiency.  Overall, the patient is hemodynamically stable and afebrile today, however I am concerned about a urinary tract infection given the appearance of the urine that is coming from her indwelling Foley.  She also likely has renal insufficiency and hyperkalemia from dehydration.  She will receive immediate calcium gluconate and antibiotics, as well as intravenous fluids.  Her chart has been reviewed and her prior microbiology shows Proteus mirabilis that is pansensitive except to [place].  I am not concerned about her ventral hernia, as it is reducible although somewhat tender, this is likely chronic.  We will check other laboratory studies, and a troponin.  The patient will  require admission to the hospital.  ----------------------------------------- 6:57 PM on 03/31/2018 -----------------------------------------  The patient does have hyperkalemia with a potassium that is greater than 6, without any EKG changes.  I have added insulin, dextrose and albuterol to her medication orders, and the patient will continue to receive intravenous fluids.  ----------------------------------------- 7:34 PM on 03/31/2018 -----------------------------------------  In addition to the patient's potassium of 6.3, the patient also has a creatinine of 3.76 with a BUN of 120 today, which is consistent with dehydration from her decreased p.o. intake.  She is hyperglycemic to 253 without DKA.  Her white blood  cell count is 18.8 and the likely source is her urine; she has been covered for UTI.  The patient also has a troponin of 0.05 but has no reported chest pain and no ischemic changes on her EKG; this will need to be trended.  She is already anticoagulated so additional aspirin is not indicated at this time.  Plan admission.  CRITICAL CARE Performed by: Eula Listen   Total critical care time: 35 minutes  Critical care time was exclusive of separately billable procedures and treating other patients.  Critical care was necessary to treat or prevent imminent or life-threatening deterioration.  Critical care was time spent personally by me on the following activities: development of treatment plan with patient and/or surrogate as well as nursing, discussions with consultants, evaluation of patient's response to treatment, examination of patient, obtaining history from patient or surrogate, ordering and performing treatments and interventions, ordering and review of laboratory studies, ordering and review of radiographic studies, pulse oximetry and re-evaluation of patient's condition.  ____________________________________________  FINAL CLINICAL IMPRESSION(S) / ED  DIAGNOSES  Final diagnoses:  Elevated troponin  Acute renal failure, unspecified acute renal failure type ([place])  Hyperkalemia  Hyperglycemia         NEW MEDICATIONS STARTED DURING THIS VISIT:  New Prescriptions   No medications on file      Eula Listen, MD 03/31/18 1858    Eula Listen, MD 03/31/18 [phone]

## 2018-03-31 NOTE — ED Notes (Signed)
IV is positional, fluids taken off pump and hung to gravity so IV pump would not keep alarming.

## 2018-03-31 NOTE — ED Notes (Signed)
Patient denies pain and is resting comfortably. Repositioned in bed with max assist

## 2018-03-31 NOTE — ED Triage Notes (Signed)
Pt arrived via EMS from [facility] with reports of elevated potassium and renal failure.  Pt is alert to self only at baseline and does not respond to most questions which is her baseline.

## 2018-04-01 DIAGNOSIS — Z7189 Other specified counseling: Secondary | ICD-10-CM

## 2018-04-01 DIAGNOSIS — L8990 Pressure ulcer of unspecified site, unspecified stage: Secondary | ICD-10-CM

## 2018-04-01 DIAGNOSIS — Z515 Encounter for palliative care: Secondary | ICD-10-CM

## 2018-04-01 DIAGNOSIS — N179 Acute kidney failure, unspecified: Secondary | ICD-10-CM

## 2018-04-01 LAB — BASIC METABOLIC PANEL
Anion gap: 12 (ref 5–15)
BUN: 113 mg/dL — ABNORMAL HIGH (ref 8–23)
CO2: 18 mmol/L — AB (ref 22–32)
Calcium: 9 mg/dL (ref 8.9–10.3)
Chloride: 109 mmol/L (ref 98–111)
Creatinine, Ser: 3.38 mg/dL — ABNORMAL HIGH (ref 0.44–1.00)
GFR calc non Af Amer: 11 mL/min — ABNORMAL LOW (ref >60)
GFR, EST AFRICAN AMERICAN: 13 mL/min — AB (ref >60)
Glucose, Bld: 236 mg/dL — ABNORMAL HIGH (ref 70–99)
Potassium: 5.8 mmol/L — ABNORMAL HIGH (ref 3.5–5.1)
Sodium: 139 mmol/L (ref 135–145)

## 2018-04-01 LAB — GLUCOSE, CAPILLARY
Glucose-Capillary: 175 mg/dL — ABNORMAL HIGH (ref 70–99)
Glucose-Capillary: 175 mg/dL — ABNORMAL HIGH (ref 70–99)
Glucose-Capillary: 246 mg/dL — ABNORMAL HIGH (ref 70–99)
Glucose-Capillary: 234 mg/dL — ABNORMAL HIGH (ref 70–99)

## 2018-04-01 LAB — CBC
HCT: 35.5 % (ref 35.0–47.0)
HEMOGLOBIN: 11.4 g/dL — AB (ref 12.0–16.0)
MCH: 30.8 pg (ref 26.0–34.0)
MCHC: 32.2 g/dL (ref 32.0–36.0)
MCV: 95.7 fL (ref 80.0–100.0)
Platelets: 264 10*3/uL (ref 150–440)
RBC: 3.71 MIL/uL — AB (ref 3.80–5.20)
RDW: 16.5 % — ABNORMAL HIGH (ref 11.5–14.5)
WBC: 14.4 10*3/uL — ABNORMAL HIGH (ref 3.6–11.0)

## 2018-04-01 LAB — MRSA PCR SCREENING: MRSA by PCR: POSITIVE — AB

## 2018-04-01 MED ORDER — SODIUM CHLORIDE 0.9 % IV SOLN
INTRAVENOUS | Status: DC
  Administered 2018-04-01 – 2018-04-02 (×2): via INTRAVENOUS
  Administered 2018-04-03: 75 mL/h via INTRAVENOUS

## 2018-04-01 MED ORDER — DAKINS (1/4 STRENGTH) 0.125 % EX SOLN
Freq: Every day | CUTANEOUS | Status: AC
  Administered 2018-04-01 – 2018-04-03 (×3)
  Filled 2018-04-01 (×2): qty 473

## 2018-04-01 MED ORDER — HEPARIN SODIUM (PORCINE) 5000 UNIT/ML IJ SOLN
5000.0000 [IU] | Freq: Three times a day (TID) | INTRAMUSCULAR | Status: DC
  Administered 2018-04-02 – 2018-04-04 (×6): 5000 [IU] via SUBCUTANEOUS
  Filled 2018-04-01 (×8): qty 1

## 2018-04-01 MED ORDER — CHLORHEXIDINE GLUCONATE CLOTH 2 % EX PADS
6.0000 | MEDICATED_PAD | Freq: Every day | CUTANEOUS | Status: DC
  Administered 2018-04-01 – 2018-04-03 (×2): 6 via TOPICAL

## 2018-04-01 MED ORDER — PREMIER PROTEIN SHAKE
11.0000 [oz_av] | Freq: Two times a day (BID) | ORAL | Status: DC
  Administered 2018-04-02 – 2018-04-04 (×3): 11 [oz_av] via ORAL

## 2018-04-01 MED ORDER — MUPIROCIN 2 % EX OINT
1.0000 "application " | TOPICAL_OINTMENT | Freq: Two times a day (BID) | CUTANEOUS | Status: DC
  Administered 2018-04-01 – 2018-04-04 (×7): 1 via NASAL
  Filled 2018-04-01: qty 22

## 2018-04-01 MED ORDER — TRAMADOL HCL 50 MG PO TABS
50.0000 mg | ORAL_TABLET | Freq: Four times a day (QID) | ORAL | Status: DC | PRN
  Administered 2018-04-01 – 2018-04-04 (×4): 50 mg via ORAL
  Filled 2018-04-01 (×4): qty 1

## 2018-04-01 MED ORDER — VITAMIN C 500 MG PO TABS
250.0000 mg | ORAL_TABLET | Freq: Two times a day (BID) | ORAL | Status: DC
  Administered 2018-04-01 – 2018-04-04 (×6): 250 mg via ORAL
  Filled 2018-04-01 (×7): qty 1

## 2018-04-01 MED ORDER — OCUVITE-LUTEIN PO CAPS
1.0000 | ORAL_CAPSULE | Freq: Every day | ORAL | Status: DC
  Administered 2018-04-01 – 2018-04-04 (×3): 1 via ORAL
  Filled 2018-04-01 (×4): qty 1

## 2018-04-01 NOTE — Progress Notes (Signed)
[place] [place] NAME: [name]    MR#:  [id number]  DATE OF BIRTH:  1928-11-04  SUBJECTIVE:  CHIEF COMPLAINT:   Chief Complaint  Patient presents with  . Weakness  . Hyperkalemia   Sent from the nursing home because of encephalopathy and possible UTI.  Patient was admitted to hospital with decubitus ulcer and infection last month.  Also noted to have a very bad decubitus ulcer this time.  Patient is drowsy and not talking much so cannot give much details. REVIEW OF SYSTEMS:   Patient is drowsy and does not give review of system.  ROS  DRUG ALLERGIES:   Allergies  Allergen Reactions  . Metformin Diarrhea  . Oxycodone-Acetaminophen Nausea And Vomiting  . Oxycodone-Acetaminophen Nausea Only  . Amoxicillin Rash and Other (See Comments)    Has patient had a PCN reaction causing immediate rash, facial/tongue/throat swelling, SOB or lightheadedness with hypotension: No Has patient had a PCN reaction causing severe rash involving mucus membranes or skin necrosis: No Has patient had a PCN reaction that required hospitalization No Has patient had a PCN reaction occurring within the last 10 years: No If all of the above answers are "NO", then may proceed with Cephalosporin use.    VITALS:  Blood pressure (!) 133/43, pulse 88, temperature 97.7 F (36.5 C), temperature source Oral, resp. rate 18, height 5\' 2"  (1.575 m), weight 96.2 kg, SpO2 95 %.  PHYSICAL EXAMINATION:  GENERAL:  84 y.o.-year-old patient lying in the bed with no acute distress.  EYES: Pupils equal, round, reactive to light . No scleral icterus. Extraocular muscles intact.  HEENT: Head atraumatic, normocephalic. Oropharynx and nasopharynx clear.  NECK:  Supple, no jugular venous distention. No thyroid enlargement, no tenderness.  LUNGS: Normal breath sounds bilaterally, no wheezing, rales,rhonchi or crepitation. No use of accessory muscles of respiration.   CARDIOVASCULAR: S1, S2 normal. No murmurs, rubs, or gallops.  ABDOMEN: Soft, nontender, nondistended. Bowel sounds present. No organomegaly or mass.  EXTREMITIES: No pedal edema, cyanosis, or clubbing.  NEUROLOGIC: Patient is drowsy, moans to stimuli but does not speak any word to communication she does not move limbs much on stimuli but she opens her mouth while the nurses assistance was feeding her.  PSYCHIATRIC: The patient is drowsy.  SKIN: Patient has decubitus ulcer on sacrum and ischial area.  Physical Exam LABORATORY PANEL:   CBC Recent Labs  Lab 04/01/18 0058  WBC 14.4*  HGB 11.4*  HCT 35.5  PLT 264   ------------------------------------------------------------------------------------------------------------------  Chemistries  Recent Labs  Lab 03/31/18 1824 04/01/18 0058  NA 137 139  K 6.3* 5.8*  CL 103 109  CO2 21* 18*  GLUCOSE 253* 236*  BUN 120* 113*  CREATININE 3.76* 3.38*  CALCIUM 9.6 9.0  AST 65*  --   ALT 49*  --   ALKPHOS 122  --   BILITOT 0.6  --    ------------------------------------------------------------------------------------------------------------------  Cardiac Enzymes Recent Labs  Lab 03/31/18 1824 03/31/18 2123  TROPONINI 0.05* 0.05*   ------------------------------------------------------------------------------------------------------------------  RADIOLOGY:  No results found.  ASSESSMENT AND PLAN:   Principal Problem:   UTI (urinary tract infection) Active Problems:   Deep vein thrombosis (DVT) of lower extremity (HCC)   Acid reflux   Essential (primary) hypertension   Hyperkalemia   Pressure injury of skin  * UTI (urinary tract infection) -IV antibiotics initiated, urine culture sent  *  Acute encephalopathy -due to her UTI, treatment as above,   *  Decubitus ulcer-as per wound care consult this has slough up to the bones and needs surgical debridement.  Surgical consult called in.  *  Hyperkalemia -patient was  given IV calcium, insulin with D50, and [place] in the ED.   her potassium came down in safe range, still high.  Will follow with IV fluids now.  *Acute on chronic renal failure We will give IV fluid and monitor renal function.  *  Essential (primary) hypertension -stable, continue home meds  *Diabetes Keep on sliding scale coverage for now.  *  Deep vein thrombosis (DVT) of lower extremity (HCC) -patient is on Xarelto, but with current renal function I will hold this medication  She may need surgical debridement on her decubitus ulcers so we will keep holding and keep on subcu heparin for DVT prophylaxis.  *   Acid reflux -Home dose PPI    All the records are reviewed and case discussed with Care Management/Social Workerr. Management plans discussed with the patient, family and they are in agreement.  CODE STATUS: partial.  TOTAL TIME TAKING CARE OF THIS PATIENT: 35 minutes.   Spoke to surgical team.  POSSIBLE D/C IN 2-3 DAYS, DEPENDING ON CLINICAL CONDITION.   Vaughan Basta M.D on 04/01/2018   Between 7am to 6pm - Pager - [phone]  After 6pm go to [email or web address] - password [email or web address] [facility]  Office  [phone]  CC: Primary care physician; Adin Hector, MD  Note: This dictation was prepared with Dragon dictation along with smaller phrase technology. Any transcriptional errors that result from this process are unintentional.

## 2018-04-01 NOTE — Consult Note (Signed)
Date of Consultation:  04/01/2018  Requesting Physician:  Dr. Anselm Jungling  Reason for Consultation:  Decubitus ulcers in sacrum and bilateral ischial tuberosities.    History of Present Illness: [name] is a 84 y.o. female admitted with encephalopathy and possible UTI.  She's been deteriorating clinically over the past few months and had an admission last month with decubitus ulcers as well.  Her husband reports that since about January of this year she has been deteriorating in mobility and has been more bedbound.  Particular the last 2 months she has been even worse to the point that it is hard to get her out of bed or do much ambulation.  With that she has been developing worsening ulcers over the sacrum and bilateral ischial tuberosities.  He has been seen and followed up by the wound care clinic.  She is currently admitted again and we are consulted because of worsening pressure ulcers.  She was evaluated by the wound nurse today and she recommended debridement.  The patient currently is somewhat drowsy and her husband the one giving most of the history.  Past Medical History: Past Medical History:  Diagnosis Date  . Arthritis   . Basal cell carcinoma 2019   LLE  . Breast cancer ([place]) 2014   right- radiation. Dr Pat Patrick  . Diabetes mellitus without complication ([place])   . Gastric ulcer   . Glaucoma   . Hiatal hernia   . Hypertension   . Osteoporosis   . Recurrent deep vein thrombosis (DVT) ([facility])      Past Surgical History: Past Surgical History:  Procedure Laterality Date  . APPENDECTOMY    . BREAST BIOPSY Right 2014  . COLONOSCOPY  2013  . PARTIAL COLECTOMY     pre cancerous polyps/ over 10 years ago  . REPLACEMENT TOTAL KNEE    . TONSILLECTOMY    . VENTRAL HERNIA REPAIR N/A 06/04/2015   Procedure:  REPAIR of INCARCERATED VENTRAL HERNIA;  Surgeon: Marlyce Huge, MD;  Location: [facility] ORS;  Service: General;  Laterality: N/A;  . VENTRAL HERNIA REPAIR N/A 12/18/2015    Procedure: repair of recurent incarcerated ventral hernia with mesh;  Surgeon: Florene Glen, MD;  Location: [facility] ORS;  Service: General;  Laterality: N/A;    Home Medications: Prior to Admission medications   Medication Sig Start Date End Date Taking? Authorizing Provider  allopurinol (ZYLOPRIM) 100 MG tablet Take 200 mg by mouth daily.    Yes [provider]  ascorbic acid (VITAMIN C) 500 MG tablet Take 1,000 mg by mouth daily.    Yes [provider]  aspirin EC 81 MG tablet Take 81 mg by mouth every evening.   Yes [provider]  atenolol (TENORMIN) 25 MG tablet Take 25 mg by mouth every evening.    Yes [provider]  bisacodyl (BISACODYL) 5 MG EC tablet Take 5 mg by mouth daily as needed for moderate constipation.   Yes [provider]  Calcium Carbonate-Vitamin D (OYSTER SHELL CALCIUM 500 + D) 500-125 MG-UNIT TABS Take 1 tablet by mouth daily.    Yes [provider]  cholecalciferol (VITAMIN D) 1000 UNITS tablet Take 1,000 Units by mouth daily.   Yes [provider]  clonazepam (KLONOPIN) 0.125 MG disintegrating tablet Take 0.125 mg by mouth 2 (two) times daily.   Yes [provider]  collagenase (SANTYL) ointment Apply 1 application topically 2 (two) times daily. Pressure ulcer of sacral region   Yes [provider]  DULoxetine (CYMBALTA) 30 MG capsule Take 90 mg by mouth daily.   Yes [provider]  folic acid (FOLVITE) 1 MG tablet Take 1 tablet by mouth every evening.    Yes [provider]  insulin aspart (NOVOLOG) 100 UNIT/ML injection Inject 0-9 Units into the skin 3 (three) times daily with meals. CBG < 70: implement hypoglycemia protocol CBG 70 - 120: 0 units CBG 121 - 150: 1 unit CBG 151 - 200: 2 units CBG 201 - 250: 3 units CBG 251 - 300: 5 units CBG 301 - 350: 7 units CBG 351 - 400: 9 units CBG > 400: call MD and obtain STAT lab verification 03/08/18  Yes Gouru, Aruna, MD   insulin aspart (NOVOLOG) 100 UNIT/ML injection Inject 0-5 Units into the skin at bedtime. CBG < 70: implement hypoglycemia protocol CBG 70 - 120: 0 units CBG 121 - 150: 0 units CBG 151 - 200: 0 units CBG 201 - 250: 2 units CBG 251 - 300: 3 units CBG 301 - 350: 4 units CBG 351 - 400: 5 units CBG > 400: call MD and obtain STAT lab verification 03/08/18  Yes Gouru, Aruna, MD  ketoconazole (NIZORAL) 2 % cream Apply 1 application topically 2 (two) times daily. 10/24/17  Yes Fritzi Mandes, MD  latanoprost (XALATAN) 0.005 % ophthalmic solution Place 1 drop into both eyes at bedtime.    Yes [provider]  lisinopril (PRINIVIL,ZESTRIL) 30 MG tablet Take 30 mg by mouth 2 (two) times daily.   Yes [provider]  Misc Natural Products (OSTEO BI-FLEX ADV DOUBLE ST PO) Take 1 tablet by mouth daily.    Yes [provider]  modafinil (PROVIGIL) 100 MG tablet Take 100 mg by mouth 2 (two) times daily.   Yes [provider]  Multiple Vitamin (MULTIVITAMIN WITH MINERALS) TABS tablet Take 1 tablet by mouth daily. 03/09/18  Yes Gouru, Illene Silver, MD  multivitamin-lutein (OCUVITE-LUTEIN) CAPS capsule Take 1 capsule by mouth at bedtime. 03/08/18  Yes Gouru, Illene Silver, MD  omeprazole (PRILOSEC) 20 MG capsule Take 20 mg by mouth daily as needed (GERD).    Yes [provider]  ondansetron (ZOFRAN) 4 MG tablet Take 1 tablet (4 mg total) by mouth every 6 (six) hours as needed for nausea. 03/08/18  Yes Gouru, Illene Silver, MD  polyethylene glycol (MIRALAX / GLYCOLAX) packet Take 17 g by mouth daily as needed for mild constipation. Patient taking differently: Take 17 g by mouth daily.  03/08/18  Yes Gouru, Illene Silver, MD  protein supplement shake (PREMIER PROTEIN) LIQD Take 325 mLs (11 oz total) by mouth 2 (two) times daily between meals. 03/08/18  Yes Gouru, Illene Silver, MD  rivaroxaban (XARELTO) 20 MG TABS tablet Take 20 mg by mouth every morning.    Yes [provider]  sitaGLIPtin (JANUVIA) 100 MG  tablet Take 100 mg by mouth every evening.    Yes [provider]  acetaminophen (TYLENOL) 325 MG tablet Take 2 tablets (650 mg total) by mouth every 6 (six) hours as needed for mild pain (or Fever >/= 101). 03/08/18   Nicholes Mango, MD  nystatin (MYCOSTATIN/NYSTOP) powder Apply topically 2 (two) times daily. Patient not taking: Reported on 03/31/2018 03/08/18   Nicholes Mango, MD  traMADol (ULTRAM) 50 MG tablet Take 1 tablet (50 mg total) by mouth every 6 (six) hours as needed for moderate pain. 03/08/18   Nicholes Mango, MD    Allergies: Allergies  Allergen Reactions  . Metformin Diarrhea  . Oxycodone-Acetaminophen Nausea And  Vomiting  . Oxycodone-Acetaminophen Nausea Only  . Amoxicillin Rash and Other (See Comments)    Has patient had a PCN reaction causing immediate rash, facial/tongue/throat swelling, SOB or lightheadedness with hypotension: No Has patient had a PCN reaction causing severe rash involving mucus membranes or skin necrosis: No Has patient had a PCN reaction that required hospitalization No Has patient had a PCN reaction occurring within the last 10 years: No If all of the above answers are "NO", then may proceed with Cephalosporin use.    Social History:  reports that she has never smoked. She has never used smokeless tobacco. She reports that she does not drink alcohol or use drugs.   Family History: Family History  Problem Relation Age of Onset  . Diabetes Mother   . Hypertension Mother   . Deep vein thrombosis Mother   . Heart attack Father   . Breast cancer Neg Hx     Review of Systems: Review of Systems  Unable to perform ROS: Mental acuity    Physical Exam BP (!) 133/43 (BP Location: Left Arm)   Pulse 88   Temp 97.7 F (36.5 C) (Oral)   Resp 18   Ht 5\' 2"  (1.575 m)   Wt 96.2 kg   SpO2 95%   BMI 38.79 kg/m  CONSTITUTIONAL: No acute distress HEENT:  Normocephalic, atraumatic NECK:  Supple, no JVD present. CARDIAC:  Regular rhythm and rate,  no murmurs PULMONARY:  No respiratory distress, no wheezes. GI: The abdomen is obese, soft, nontender. There were no palpable masses.  SKIN:  Patient has a stage 3 large decubitus ulcer at the sacrum, with necrotic tissue and foul odor, but no purulent drainage.  At the left ischial tuberosity, there is a more superficial stage 2 with ulceration.  At the right ischial tuberosity, there is a deeper stage 3 wound with what looks like a clean wound bed and currently packed with gauze.    Laboratory Analysis: Results for orders placed or performed during the hospital encounter of 03/31/18 (from the past 24 hour(s))  Urinalysis, Complete w Microscopic     Status: Abnormal   Collection Time: 03/31/18  6:11 PM  Result Value Ref Range   Color, Urine YELLOW (A) YELLOW   APPearance TURBID (A) CLEAR   Specific Gravity, Urine 1.019 1.005 - 1.030   pH 5.0 5.0 - 8.0   Glucose, UA NEGATIVE NEGATIVE mg/dL   Hgb urine dipstick MODERATE (A) NEGATIVE   Bilirubin Urine NEGATIVE NEGATIVE   Ketones, ur NEGATIVE NEGATIVE mg/dL   Protein, ur 100 (A) NEGATIVE mg/dL   Nitrite NEGATIVE NEGATIVE   Leukocytes, UA MODERATE (A) NEGATIVE   RBC / HPF 11-20 0 - 5 RBC/hpf   WBC, UA >50 (H) 0 - 5 WBC/hpf   Bacteria, UA NONE SEEN NONE SEEN   Squamous Epithelial / LPF NONE SEEN 0 - 5   Amorphous Crystal PRESENT   Basic metabolic panel     Status: Abnormal   Collection Time: 03/31/18  6:24 PM  Result Value Ref Range   Sodium 137 135 - 145 mmol/L   Potassium 6.3 (HH) 3.5 - 5.1 mmol/L   Chloride 103 98 - 111 mmol/L   CO2 21 (L) 22 - 32 mmol/L   Glucose, Bld 253 (H) 70 - 99 mg/dL   BUN 120 (H) 8 - 23 mg/dL   Creatinine, Ser 3.76 (H) 0.44 - 1.00 mg/dL   Calcium 9.6 8.9 - 10.3 mg/dL   GFR calc non Af Wyvonnia Lora  10 (L) >60 mL/min   GFR calc Af Amer 12 (L) >60 mL/min   Anion gap 13 5 - 15  CBC     Status: Abnormal   Collection Time: 03/31/18  6:24 PM  Result Value Ref Range   WBC 18.8 (H) 3.6 - 11.0 K/uL   RBC 3.88 3.80 -  5.20 MIL/uL   Hemoglobin 12.5 12.0 - 16.0 g/dL   HCT 36.0 35.0 - 47.0 %   MCV 92.6 80.0 - 100.0 fL   MCH 32.2 26.0 - 34.0 pg   MCHC 34.7 32.0 - 36.0 g/dL   RDW 16.4 (H) 11.5 - 14.5 %   Platelets 322 150 - 440 K/uL  Hepatic function panel     Status: Abnormal   Collection Time: 03/31/18  6:24 PM  Result Value Ref Range   Total Protein 7.9 6.5 - 8.1 g/dL   Albumin 2.9 (L) 3.5 - 5.0 g/dL   AST 65 (H) 15 - 41 U/L   ALT 49 (H) 0 - 44 U/L   Alkaline Phosphatase 122 38 - 126 U/L   Total Bilirubin 0.6 0.3 - 1.2 mg/dL   Bilirubin, Direct 0.3 (H) 0.0 - 0.2 mg/dL   Indirect Bilirubin 0.3 0.3 - 0.9 mg/dL  Troponin I     Status: Abnormal   Collection Time: 03/31/18  6:24 PM  Result Value Ref Range   Troponin I 0.05 (HH) <0.03 ng/mL  Blood culture (routine x 2)     Status: None (Preliminary result)   Collection Time: 03/31/18  7:31 PM  Result Value Ref Range   Specimen Description BLOOD RIGHT UPPER ARM    Special Requests      BOTTLES DRAWN AEROBIC AND ANAEROBIC Blood Culture results may not be optimal due to an excessive volume of blood received in culture bottles   Culture      NO GROWTH < 12 HOURS Performed at [facility], [address] [place], [place] [address]    Report Status PENDING   Blood culture (routine x 2)     Status: None (Preliminary result)   Collection Time: 03/31/18  8:25 PM  Result Value Ref Range   Specimen Description BLOOD RIGHT HAND    Special Requests      BOTTLES DRAWN AEROBIC AND ANAEROBIC Blood Culture results may not be optimal due to an inadequate volume of blood received in culture bottles   Culture      NO GROWTH < 12 HOURS Performed at [facility], [address] [place], [place] [address]    Report Status PENDING   Troponin I     Status: Abnormal   Collection Time: 03/31/18  9:23 PM  Result Value Ref Range   Troponin I 0.05 (HH) <0.03 ng/mL  Glucose, capillary     Status: Abnormal   Collection Time: 03/31/18 11:21 PM   Result Value Ref Range   Glucose-Capillary 222 (H) 70 - 99 mg/dL   Comment 1 Notify RN    Comment 2 Document in Chart   MRSA PCR Screening     Status: Abnormal   Collection Time: 04/01/18 12:13 AM  Result Value Ref Range   MRSA by PCR POSITIVE (A) NEGATIVE  Basic metabolic panel     Status: Abnormal   Collection Time: 04/01/18 12:58 AM  Result Value Ref Range   Sodium 139 135 - 145 mmol/L   Potassium 5.8 (H) 3.5 - 5.1 mmol/L   Chloride 109 98 - 111 mmol/L   CO2 18 (  L) 22 - 32 mmol/L   Glucose, Bld 236 (H) 70 - 99 mg/dL   BUN 113 (H) 8 - 23 mg/dL   Creatinine, Ser 3.38 (H) 0.44 - 1.00 mg/dL   Calcium 9.0 8.9 - 10.3 mg/dL   GFR calc non Af Amer 11 (L) >60 mL/min   GFR calc Af Amer 13 (L) >60 mL/min   Anion gap 12 5 - 15  CBC     Status: Abnormal   Collection Time: 04/01/18 12:58 AM  Result Value Ref Range   WBC 14.4 (H) 3.6 - 11.0 K/uL   RBC 3.71 (L) 3.80 - 5.20 MIL/uL   Hemoglobin 11.4 (L) 12.0 - 16.0 g/dL   HCT 35.5 35.0 - 47.0 %   MCV 95.7 80.0 - 100.0 fL   MCH 30.8 26.0 - 34.0 pg   MCHC 32.2 32.0 - 36.0 g/dL   RDW 16.5 (H) 11.5 - 14.5 %   Platelets 264 150 - 440 K/uL  Glucose, capillary     Status: Abnormal   Collection Time: 04/01/18  7:57 AM  Result Value Ref Range   Glucose-Capillary 175 (H) 70 - 99 mg/dL  Glucose, capillary     Status: Abnormal   Collection Time: 04/01/18 12:12 PM  Result Value Ref Range   Glucose-Capillary 175 (H) 70 - 99 mg/dL    Imaging: No results found.  Assessment and Plan: This is a 84 y.o. female with worsening sacral and bilateral ischial tuberosity decubitus ulcers.  Discussed with the patient's husband that she does require debridement of sacral decubitus ulcer.  The other 2 did not need debridement at this time.  Using a scalpel and forceps, all the necrotic tissue at the sacral wound was debrided sharply down to bleeding tissue in the subcutaneous space.  The wound did not reach all the way to bone and thus is only stage III at  this time.  The wound was then dressed with wet-to-dry dressing using Dakin's solution that the wound nurse had recommended.  All wounds were dressed this way.  Recommend doing dressing changes with wet-to-dry gauze using Dakin's solution as recommended by the wound nurse.  This should be done twice a day for the next 5 days and then can be changed to wet to dry with saline instead of Dakin's solution, also twice daily afterwards in order to help as much as possible with cleaning of the wound.  She should follow up with the Wound Care center upon discharge.  Either patient's RN or wound RN can do the dressing changes.  Please contact us if any questions or concerns or if the wounds require further debridement.  Face-to-face time spent with the patient and care providers was 55 minutes, with more than 50% of the time spent counseling, educating, and coordinating care of the patient.     Melvyn Neth, MD [facility] Pg:  [phone]

## 2018-04-01 NOTE — Consult Note (Signed)
[facility] Nurse wound consult note Reason for Consult:Full thickness pressure injury to sacrum and bilateral ischial tuberosities. Present on admission.  Unstageable due to the presence of devitalized tissue to wound bed.  Patient with acute encephalopathy. Foley catheter in place to promote healing prior to admission.  Wound type:moisture and pressure, unstageable Pressure Injury POA: Yes Measurement: 4 cm x 12 cm with 100% slough to sacrum.  Left ischium:  4 cm x 3 cm 100% slough Right ischium:  2 cm x 2 cm 100% slough Wound bed: 100% devitalized tissue Drainage (amount, consistency, odor) moderate serosanguinous  Necrotic odor Periwound:erythema and friable tissue from prolonged exposure to moisture Dressing procedure/placement/frequency:Recommend surgical consult to evaluate if appropriate for debridement.  Cleanse sacral and ischial wounds with NS.  Apply Dakins moist gauze to wound bed.  Cover with ABD pads and tape.  Change daily.  Will order mattress with low air loss feature.  Will not follow at this time.  Please re-consult if needed.  Domenic Moras MSN, RN, FNP-BC CWON Wound, Ostomy, Continence Nurse Pager [phone]

## 2018-04-01 NOTE — Progress Notes (Signed)
Wound care and dressing change done with surgical MD. Will continue to monitor patient.

## 2018-04-01 NOTE — Progress Notes (Signed)
Please note patient is currently followed by outpatient Palliative at [facility]. CSW Annamaria Boots made aware.  Flo Shanks RN, BSN, [facility]

## 2018-04-01 NOTE — Consult Note (Addendum)
Consultation Note Date: 04/01/2018   Patient Name: [name]  DOB: Jan 07, 1929  MRN: [id number]  Age / Sex: 84 y.o., female  PCP: Adin Hector, MD Referring Physician: Vaughan Basta, *  Reason for Consultation: Establishing goals of care  HPI/Patient Profile: [name]  is a 84 y.o. female.  Patient is unable to contribute to her history due to acute encephalopathy from her UTI and history is taken from her husband who is at bedside.  He states that she has been at the nursing facility for the past 3 weeks, and has been steadily declining over that time.  He states that her appetite has been poor, and she has had significantly decreased p.o. intake.  She was brought tonight for evaluation due to lab work that was done, and she was found to have elevated potassium as well as creatinine.  Labs here in the ED confirmed the same.  Patient is clinically very dehydrated.  Clinical Assessment and Goals of Care: Patient is resting in bed with paid caregiver at bedside. She is confused. She denies pain.  Caregiver states she has been working with the family for several years and has noticed decline in her. She had spent most of her time in a recliner requiring assistance with ADL's, getting up to use a bedside toilet. She has been in a facility for the past 3 weeks and has continued to decline.   Spoke with husband via phone. He states his objective is to treat her wounds so that he can get her home. His goal is for her to eat and drink more. He realizes that she is dehydrated, and states a doctor told him that could be fixed in a couple of days.  He is not interested in further discussion of goals of care at this time.  He states he knows a representative with [facility], and will call her after he has explored other avenues.       SUMMARY OF RECOMMENDATIONS   Husband  is not interested in discussing goals of care at this time.   Code Status/Advance Care Planning:  Limited code    Symptom Management:   Per primary team.   Palliative Prophylaxis:   Eye Care and Oral Care  Prognosis:   Poor overall. Poor PO intake. Dehydration with elevated creatinine.   Discharge Planning: To Be Determined      Primary Diagnoses: Present on Admission: . Acid reflux . Deep vein thrombosis (DVT) of lower extremity ([place]) . Essential (primary) hypertension . UTI (urinary tract infection) . Hyperkalemia   I have reviewed the medical record, interviewed the patient and family, and examined the patient. The following aspects are pertinent.  Past Medical History:  Diagnosis Date  . Arthritis   . Basal cell carcinoma 2019   LLE  . Breast cancer ([place]) 2014   right- radiation. Dr Pat Patrick  . Diabetes mellitus without complication ([place])   . Gastric ulcer   . Glaucoma   . Hiatal hernia   .  Hypertension   . Osteoporosis   . Recurrent deep vein thrombosis (DVT) ([facility])    Social History   Socioeconomic History  . Marital status: Married    Spouse name: Not on file  . Number of children: Not on file  . Years of education: Not on file  . Highest education level: Not on file  Occupational History  . Not on file  Social Needs  . Financial resource strain: Not on file  . Food insecurity:    Worry: Not on file    Inability: Not on file  . Transportation needs:    Medical: Not on file    Non-medical: Not on file  Tobacco Use  . Smoking status: Never Smoker  . Smokeless tobacco: Never Used  Substance and Sexual Activity  . Alcohol use: No    Alcohol/week: 0.0 standard drinks  . Drug use: No  . Sexual activity: Not Currently    Birth control/protection: Surgical  Lifestyle  . Physical activity:    Days per week: Not on file    Minutes per session: Not on file  . Stress: Not on file  Relationships  . Social connections:    Talks on phone: Not on  file    Gets together: Not on file    Attends religious service: Not on file    Active member of club or organization: Not on file    Attends meetings of clubs or organizations: Not on file    Relationship status: Not on file  Other Topics Concern  . Not on file  Social History Narrative  . Not on file   Family History  Problem Relation Age of Onset  . Diabetes Mother   . Hypertension Mother   . Deep vein thrombosis Mother   . Heart attack Father   . Breast cancer Neg Hx    Scheduled Meds: . aspirin EC  81 mg Oral QPM  . Chlorhexidine Gluconate Cloth  6 each Topical Q0600  . clonazepam  0.125 mg Oral BID  . DULoxetine  90 mg Oral Daily  . insulin aspart  0-9 Units Subcutaneous TID AC & HS  . latanoprost  1 drop Both Eyes QHS  . multivitamin-lutein  1 capsule Oral Daily  . mupirocin ointment  1 application Nasal BID  . pantoprazole  40 mg Oral Daily  . protein supplement shake  11 oz Oral BID BM  . sodium hypochlorite   Irrigation Q1200  . vitamin C  250 mg Oral BID   Continuous Infusions: . sodium chloride 75 mL/hr at 04/01/18 1258  . cefTRIAXone (ROCEPHIN)  IV     PRN Meds:.acetaminophen **OR** acetaminophen, ondansetron **OR** ondansetron (ZOFRAN) IV, polyethylene glycol Medications Prior to Admission:  Prior to Admission medications   Medication Sig Start Date End Date Taking? Authorizing Provider  allopurinol (ZYLOPRIM) 100 MG tablet Take 200 mg by mouth daily.    Yes [provider]  ascorbic acid (VITAMIN C) 500 MG tablet Take 1,000 mg by mouth daily.    Yes [provider]  aspirin EC 81 MG tablet Take 81 mg by mouth every evening.   Yes [provider]  atenolol (TENORMIN) 25 MG tablet Take 25 mg by mouth every evening.    Yes [provider]  bisacodyl (BISACODYL) 5 MG EC tablet Take 5 mg by mouth daily as needed for moderate constipation.   Yes [provider]  Calcium Carbonate-Vitamin D (OYSTER SHELL CALCIUM 500 +  D) 500-125 MG-UNIT TABS Take  1 tablet by mouth daily.    Yes [provider]  cholecalciferol (VITAMIN D) 1000 UNITS tablet Take 1,000 Units by mouth daily.   Yes [provider]  clonazepam (KLONOPIN) 0.125 MG disintegrating tablet Take 0.125 mg by mouth 2 (two) times daily.   Yes [provider]  collagenase (SANTYL) ointment Apply 1 application topically 2 (two) times daily. Pressure ulcer of sacral region   Yes [provider]  DULoxetine (CYMBALTA) 30 MG capsule Take 90 mg by mouth daily.   Yes [provider]  folic acid (FOLVITE) 1 MG tablet Take 1 tablet by mouth every evening.    Yes [provider]  insulin aspart (NOVOLOG) 100 UNIT/ML injection Inject 0-9 Units into the skin 3 (three) times daily with meals. CBG < 70: implement hypoglycemia protocol CBG 70 - 120: 0 units CBG 121 - 150: 1 unit CBG 151 - 200: 2 units CBG 201 - 250: 3 units CBG 251 - 300: 5 units CBG 301 - 350: 7 units CBG 351 - 400: 9 units CBG > 400: call MD and obtain STAT lab verification 03/08/18  Yes Gouru, Aruna, MD  insulin aspart (NOVOLOG) 100 UNIT/ML injection Inject 0-5 Units into the skin at bedtime. CBG < 70: implement hypoglycemia protocol CBG 70 - 120: 0 units CBG 121 - 150: 0 units CBG 151 - 200: 0 units CBG 201 - 250: 2 units CBG 251 - 300: 3 units CBG 301 - 350: 4 units CBG 351 - 400: 5 units CBG > 400: call MD and obtain STAT lab verification 03/08/18  Yes Gouru, Aruna, MD  ketoconazole (NIZORAL) 2 % cream Apply 1 application topically 2 (two) times daily. 10/24/17  Yes Fritzi Mandes, MD  latanoprost (XALATAN) 0.005 % ophthalmic solution Place 1 drop into both eyes at bedtime.    Yes [provider]  lisinopril (PRINIVIL,ZESTRIL) 30 MG tablet Take 30 mg by mouth 2 (two) times daily.   Yes [provider]  Misc Natural Products (OSTEO BI-FLEX ADV DOUBLE ST PO) Take 1 tablet by mouth daily.    Yes [provider]    modafinil (PROVIGIL) 100 MG tablet Take 100 mg by mouth 2 (two) times daily.   Yes [provider]  Multiple Vitamin (MULTIVITAMIN WITH MINERALS) TABS tablet Take 1 tablet by mouth daily. 03/09/18  Yes Gouru, Illene Silver, MD  multivitamin-lutein (OCUVITE-LUTEIN) CAPS capsule Take 1 capsule by mouth at bedtime. 03/08/18  Yes Gouru, Illene Silver, MD  omeprazole (PRILOSEC) 20 MG capsule Take 20 mg by mouth daily as needed (GERD).    Yes [provider]  ondansetron (ZOFRAN) 4 MG tablet Take 1 tablet (4 mg total) by mouth every 6 (six) hours as needed for nausea. 03/08/18  Yes Gouru, Illene Silver, MD  polyethylene glycol (MIRALAX / GLYCOLAX) packet Take 17 g by mouth daily as needed for mild constipation. Patient taking differently: Take 17 g by mouth daily.  03/08/18  Yes Gouru, Illene Silver, MD  protein supplement shake (PREMIER PROTEIN) LIQD Take 325 mLs (11 oz total) by mouth 2 (two) times daily between meals. 03/08/18  Yes Gouru, Illene Silver, MD  rivaroxaban (XARELTO) 20 MG TABS tablet Take 20 mg by mouth every morning.    Yes [provider]  sitaGLIPtin (JANUVIA) 100 MG tablet Take 100 mg by mouth every evening.    Yes [provider]  acetaminophen (TYLENOL) 325 MG tablet Take 2 tablets (650 mg total) by mouth every 6 (six) hours as needed for mild pain (or  Fever >/= 101). 03/08/18   Nicholes Mango, MD  nystatin (MYCOSTATIN/NYSTOP) powder Apply topically 2 (two) times daily. Patient not taking: Reported on 03/31/2018 03/08/18   Nicholes Mango, MD  traMADol (ULTRAM) 50 MG tablet Take 1 tablet (50 mg total) by mouth every 6 (six) hours as needed for moderate pain. 03/08/18   Nicholes Mango, MD   Allergies  Allergen Reactions  . Metformin Diarrhea  . Oxycodone-Acetaminophen Nausea And Vomiting  . Oxycodone-Acetaminophen Nausea Only  . Amoxicillin Rash and Other (See Comments)    Has patient had a PCN reaction causing immediate rash, facial/tongue/throat swelling, SOB or lightheadedness with hypotension:  No Has patient had a PCN reaction causing severe rash involving mucus membranes or skin necrosis: No Has patient had a PCN reaction that required hospitalization No Has patient had a PCN reaction occurring within the last 10 years: No If all of the above answers are "NO", then may proceed with Cephalosporin use.   Review of Systems  Unable to perform ROS   Physical Exam  Constitutional: No distress.  Pulmonary/Chest: Effort normal.  Neurological: She is alert.  Skin: Skin is warm and dry.    Vital Signs: BP (!) 133/43 (BP Location: Left Arm)   Pulse 88   Temp 97.7 F (36.5 C) (Oral)   Resp 18   Ht 5\' 2"  (1.575 m)   Wt 96.2 kg   SpO2 95%   BMI 38.79 kg/m  Pain Scale: 0-10   Pain Score: 0-No pain   SpO2: SpO2: 95 % O2 Device:SpO2: 95 % O2 Flow Rate: .   IO: Intake/output summary:   Intake/Output Summary (Last 24 hours) at 04/01/2018 1321 Last data filed at 04/01/2018 1204 Gross per 24 hour  Intake 959.49 ml  Output 200 ml  Net 759.49 ml    LBM: Last BM Date: (uknown) Baseline Weight: Weight: 99.1 kg Most recent weight: Weight: 96.2 kg     Palliative Assessment/Data:     Time In: 12:50 Time Out: 1:30 Time Total: 40 min Greater than 50%  of this time was spent counseling and coordinating care related to the above assessment and plan.  Signed by: Asencion Gowda, NP   Please contact Palliative Medicine Team phone at [phone] for questions and concerns.  For individual provider: See Shea Evans

## 2018-04-01 NOTE — Progress Notes (Addendum)
Initial Nutrition Assessment  DOCUMENTATION CODES:   Obesity unspecified  INTERVENTION:  Premier Protein BID, each supplement provides 160 kcal and 30 grams of protein. Patient prefers vanilla flavor.   Ocuvite daily   Vitamin C 250 mg BID  Liberalize diet to carbohydrate modified   NUTRITION DIAGNOSIS:   Increased nutrient needs related to wound healing as evidenced by estimated needs.  GOAL:   Patient will meet greater than or equal to 90% of their needs  MONITOR:   PO intake, Supplement acceptance, Labs, Weight trends, Skin, I & O's  REASON FOR ASSESSMENT:   Malnutrition Screening Tool    ASSESSMENT:   84 yo female w/ PMHx of obesity, type 2 diabetes, breast cancer, recurrent DVT, osteoporosis, ventral hernia, basal cell carcinoma, gastric ulcer and hypertension is admitted with UTI, acute encephalopathy, hyperkalemia, and multiple pressure injuries.   Visited pt at bedside with caregiver present. Caregiver reports she had a good appetite this AM, consuming sherbet, meatloaf, and mashed potatoes. No apparent trouble chewing or swallowing. Per chart and caregiver, patient had poor appetite PTA while in SNF. Patient is familiar to nutrition dept and last seen by RD here 8/25. Patient continues to have multiple pressure wounds. Per chart, goal of family is to treat wounds as well as getting her to eat and drink more. Will continue regimen to provide extra protein through medical food supplements and vitamins to assist with wound healing.   Patient continues to be at risk for malnutrition but does not meet criteria at this time.   Medications reviewed and include: aspirin, Novolog sliding scale, Protonix Labs reviewed: Glucose 236, Potassium 5.8, Co2 18, BUN 113, Creatinine 3.38, GFR 11, WBC 14.4, Hbg 11.4, RBC 3,71  NUTRITION - FOCUSED PHYSICAL EXAM:    Most Recent Value  Orbital Region  No depletion  Upper Arm Region  No depletion  Thoracic and Lumbar Region  No  depletion  Buccal Region  No depletion  Temple Region  No depletion  Clavicle Bone Region  No depletion  Clavicle and Acromion Bone Region  No depletion  Scapular Bone Region  No depletion  Dorsal Hand  No depletion  Patellar Region  No depletion  Anterior Thigh Region  No depletion  Posterior Calf Region  Unable to assess  Hair  Reviewed [alopecia]  Eyes  Reviewed  Mouth  Reviewed  Skin  Reviewed  Nails  Reviewed       Diet Order:   Diet Order            Diet Carb Modified Fluid consistency: Thin; Room service appropriate? Yes  Diet effective now              EDUCATION NEEDS:   No education needs have been identified at this time  Skin:  Skin Assessment: Reviewed RN Assessment(Stage III pressure ulcer on buttocks; stage II pressure ulcer on back; non-pressure wounds to bilateral anterior thighs, left abdominal fold)  Last BM:   unknown, per chart  Height:   Ht Readings from Last 1 Encounters:  03/31/18 5\' 2"  (1.575 m)    Weight:   Wt Readings from Last 1 Encounters:  03/31/18 96.2 kg    Ideal Body Weight:  50 kg  BMI:  Body mass index is 38.79 kg/m.  Estimated Nutritional Needs:   Kcal:  1600-1900  Protein:  100-120  Fluid:  1.6-1.9 L    Izaan Kingbird, Dietetic Intern [phone]

## 2018-04-01 NOTE — Clinical Social Work Note (Signed)
Clinical Social Work Assessment  Patient Details  Name: [name] MRN: [id number] Date of Birth: [birth year]  Date of referral:  04/01/18               Reason for consult:  Facility Placement                Permission sought to share information with:  Case Manager, Customer service manager, Family Supports Permission granted to share information::  Yes, Verbal Permission Granted  Name::        Agency::     Relationship::     Contact Information:     Housing/Transportation Living arrangements for the past 2 months:  [place] of Information:  Spouse Patient Interpreter Needed:  None Criminal Activity/Legal Involvement Pertinent to Current Situation/Hospitalization:  No - Comment as needed Significant Relationships:  Adult Children, Spouse Lives with:  Spouse Do you feel safe going back to the place where you live?  Yes Need for family participation in patient care:  Yes (Comment)  Care giving concerns:  Patient admitted from [facility]    Social Worker assessment / plan:  CSW consulted for SNF placement. CSW attempted to meet with patient but she was asleep and per staff she is very confused. CSW contacted patient's husband [name] [phone]. Husband states that patient was at [facility] for short term rehab and he would prefer that she return to [facility] when ready for discharge. CSW spoke with Otila Kluver at [facility] and she states that they can accept patient back when ready for discharge.   Employment status:  Retired Forensic scientist:  Medicare PT Recommendations:  Not assessed at this time Information / Referral to community resources:  [place]  Patient/Family's Response to care:  Husband thanked CSW for assistance   Patient/Family's Understanding of and Emotional Response to Diagnosis, Current Treatment, and Prognosis:  Husband is in agreement with discharge plan   Emotional Assessment Appearance:  Appears stated  age Attitude/Demeanor/Rapport:  Lethargic Affect (typically observed):  Unable to Assess Orientation:  Oriented to Self Alcohol / Substance use:  Not Applicable Psych involvement (Current and /or in the community):  No (Comment)  Discharge Needs  Concerns to be addressed:  Discharge Planning Concerns Readmission within the last 30 days:  Yes Current discharge risk:  None Barriers to Discharge:  Continued Medical Work up   [employer], [place] 04/01/2018, 2:58 PM

## 2018-04-01 NOTE — NC FL2 (Signed)
[place] LEVEL OF CARE SCREENING TOOL     IDENTIFICATION  Patient Name: [name] Birthdate: [birth year] Sex: female Admission Date (Current Location): 03/31/2018  [facility] and [place] Number:  Engineering geologist and Address:  [facility], [address], [place], [place] [address]      Provider Number: [id number]  Attending Physician Name and Address:  Vaughan Basta, *  Relative Name and Phone Number:  Gwendolen Hewlett- Spouse [phone]    Current Level of Care: Hospital Recommended Level of Care: [place] Prior Approval Number:    Date Approved/Denied:   PASRR Number:    Discharge Plan: SNF    Current Diagnoses: Patient Active Problem List   Diagnosis Date Noted  . Pressure injury of skin 04/01/2018  . UTI (urinary tract infection) 03/31/2018  . Hyperkalemia 03/31/2018  . Sacral pressure ulcer 03/04/2018  . Coagulopathy ([place]) 10/23/2017  . Left knee pain 10/21/2017  . Decubitus ulcer of sacral region, stage 1 08/12/2017  . Recurrent ventral hernia 07/30/2017  . External hemorrhoids 05/19/2016  . Recurrent ventral hernia with incarceration 12/13/2015  . Abdominal hernia 12/13/2015  . Obesity (BMI 35.0-39.9 without comorbidity)   . Essential (primary) hypertension 12/04/2015  . Uncontrolled type 2 diabetes mellitus with hyperglycemia, without long-term current use of insulin ([place]) 12/04/2015  . Body mass index (BMI) of 40.0-44.9 in adult ([place]) 11/27/2015  . Type 2 diabetes mellitus ([place]) 06/18/2015  . Deep vein thrombosis (DVT) of lower extremity ([place]) 06/18/2015  . Acid reflux 06/18/2015  . Arthritis urica 06/18/2015  . Arthritis, degenerative 06/18/2015  . Osteoporosis, post-menopausal 06/18/2015  . Incarcerated ventral hernia 06/04/2015  . Umbilical hernia, incarcerated   . Adenocarcinoma of right breast ([place]) 12/14/2014  . Malignant neoplasm of female breast ([place]) 12/14/2014  . Carcinoma of  breast ([place]) 12/14/2014  . Idiopathic localized osteoarthropathy 09/25/2011    Orientation RESPIRATION BLADDER Height & Weight     Self  Normal External catheter Weight: 212 lb 1.6 oz (96.2 kg) Height:  5\' 2"  (157.5 cm)  BEHAVIORAL SYMPTOMS/MOOD NEUROLOGICAL BOWEL NUTRITION STATUS  (none) (none) Continent Diet(Carb Modified )  AMBULATORY STATUS COMMUNICATION OF NEEDS Skin   Extensive Assist Verbally Other (Comment)(multiple wounds)                       Personal Care Assistance Level of Assistance  Bathing, Feeding, Dressing Bathing Assistance: Limited assistance Feeding assistance: Independent Dressing Assistance: Limited assistance     Functional Limitations Info  Sight, Hearing, Speech Sight Info: Adequate Hearing Info: Adequate Speech Info: Adequate    SPECIAL CARE FACTORS FREQUENCY  PT (By licensed PT), OT (By licensed OT)                    Contractures Contractures Info: Not present    Additional Factors Info  Code Status, Allergies Code Status Info: Partial Code  Allergies Info: Metformin, Oxycodone-acetaminophen, Oxycodone-acetaminophen, Amoxicillin           Current Medications (04/01/2018):  This is the current hospital active medication list Current Facility-Administered Medications  Medication Dose Route Frequency Provider Last Rate Last Dose  . 0.9 %  sodium chloride infusion   Intravenous Continuous Vaughan Basta, MD 75 mL/hr at 04/01/18 1258    . acetaminophen (TYLENOL) tablet 650 mg  650 mg Oral Q6H PRN Lance Coon, MD       Or  . acetaminophen (TYLENOL) suppository 650 mg  650 mg Rectal Q6H  PRN Lance Coon, MD      . aspirin EC tablet 81 mg  81 mg Oral QPM Lance Coon, MD      . cefTRIAXone (ROCEPHIN) 1 g in sodium chloride 0.9 % 100 mL IVPB  1 g Intravenous Q24H Lance Coon, MD      . Chlorhexidine Gluconate Cloth 2 % PADS 6 each  6 each Topical Q0600 Lance Coon, MD   6 each at 04/01/18 [phone]  . clonazepam  (KLONOPIN) disintegrating tablet 0.125 mg  0.125 mg Oral BID Lance Coon, MD   0.125 mg at 04/01/18 1108  . DULoxetine (CYMBALTA) DR capsule 90 mg  90 mg Oral Daily Lance Coon, MD   90 mg at 04/01/18 0935  . heparin injection 5,000 Units  5,000 Units Subcutaneous Q8H Vaughan Basta, MD      . insulin aspart (novoLOG) injection 0-9 Units  0-9 Units Subcutaneous TID AC & HS Lance Coon, MD   2 Units at 04/01/18 1255  . latanoprost (XALATAN) 0.005 % ophthalmic solution 1 drop  1 drop Both Eyes QHS Lance Coon, MD   1 drop at 04/01/18 0218  . multivitamin-lutein (OCUVITE-LUTEIN) capsule 1 capsule  1 capsule Oral Daily Vaughan Basta, MD   1 capsule at 04/01/18 1254  . mupirocin ointment (BACTROBAN) 2 % 1 application  1 application Nasal BID Lance Coon, MD   1 application at 49/82/64 [phone]  . ondansetron (ZOFRAN) tablet 4 mg  4 mg Oral Q6H PRN Lance Coon, MD       Or  . ondansetron [facility]) injection 4 mg  4 mg Intravenous Q6H PRN Lance Coon, MD      . pantoprazole (PROTONIX) EC tablet 40 mg  40 mg Oral Daily Lance Coon, MD   40 mg at 04/01/18 0936  . polyethylene glycol (MIRALAX / GLYCOLAX) packet 17 g  17 g Oral Daily PRN Lance Coon, MD      . protein supplement (PREMIER PROTEIN) liquid - approved for s/p bariatric surgery  11 oz Oral BID BM Vaughan Basta, MD      . sodium hypochlorite (DAKIN'S 1/4 STRENGTH) topical solution   Irrigation Q1200 Vaughan Basta, MD      . vitamin C (ASCORBIC ACID) tablet 250 mg  250 mg Oral BID Vaughan Basta, MD   250 mg at 04/01/18 1255     Discharge Medications: Please see discharge summary for a list of discharge medications.  Relevant Imaging Results:  Relevant Lab Results:   Additional Information    Naija Troost  Louretta Shorten, LCSWA

## 2018-04-02 LAB — CBC
HEMATOCRIT: 31 % — AB (ref 35.0–47.0)
HEMOGLOBIN: 10.2 g/dL — AB (ref 12.0–16.0)
MCH: 31.2 pg (ref 26.0–34.0)
MCHC: 32.9 g/dL (ref 32.0–36.0)
MCV: 94.8 fL (ref 80.0–100.0)
Platelets: 252 10*3/uL (ref 150–440)
RBC: 3.27 MIL/uL — AB (ref 3.80–5.20)
RDW: 16.4 % — ABNORMAL HIGH (ref 11.5–14.5)
WBC: 9.9 10*3/uL (ref 3.6–11.0)

## 2018-04-02 LAB — BASIC METABOLIC PANEL
Anion gap: 6 (ref 5–15)
BUN: 89 mg/dL — ABNORMAL HIGH (ref 8–23)
CO2: 20 mmol/L — ABNORMAL LOW (ref 22–32)
Calcium: 9 mg/dL (ref 8.9–10.3)
Chloride: 119 mmol/L — ABNORMAL HIGH (ref 98–111)
Creatinine, Ser: 1.82 mg/dL — ABNORMAL HIGH (ref 0.44–1.00)
GFR calc Af Amer: 28 mL/min — ABNORMAL LOW (ref >60)
GFR calc non Af Amer: 24 mL/min — ABNORMAL LOW (ref >60)
Glucose, Bld: 169 mg/dL — ABNORMAL HIGH (ref 70–99)
Potassium: 4.8 mmol/L (ref 3.5–5.1)
Sodium: 145 mmol/L (ref 135–145)

## 2018-04-02 LAB — GLUCOSE, CAPILLARY
GLUCOSE-CAPILLARY: 181 mg/dL — AB (ref 70–99)
GLUCOSE-CAPILLARY: 177 mg/dL — AB (ref 70–99)
Glucose-Capillary: 145 mg/dL — ABNORMAL HIGH (ref 70–99)
Glucose-Capillary: 170 mg/dL — ABNORMAL HIGH (ref 70–99)

## 2018-04-02 NOTE — Progress Notes (Signed)
[facility] [place] NAME: [name]    MR#:  [id number]  DATE OF BIRTH:  [birth year]  SUBJECTIVE:  CHIEF COMPLAINT:   Chief Complaint  Patient presents with  . Weakness  . Hyperkalemia   Sent from the nursing home because of encephalopathy and possible UTI.  Patient was admitted to hospital with decubitus ulcer and infection last month.  Also noted to have a very bad decubitus ulcer this time.  Status post debridement by surgical team on her sacral wounds, much alert and oriented today.  No complaints  REVIEW OF SYSTEMS:     Review of Systems  Constitutional: Positive for malaise/fatigue. Negative for fever.  HENT: Negative for congestion, ear discharge, nosebleeds, sore throat and tinnitus.   Eyes: Negative for double vision, photophobia and discharge.  Respiratory: Negative for cough, sputum production and shortness of breath.   Cardiovascular: Negative for chest pain, palpitations and leg swelling.  Gastrointestinal: Negative for abdominal pain, blood in stool, heartburn, nausea and vomiting.  Genitourinary: Negative for frequency and urgency.  Musculoskeletal: Negative for back pain and neck pain.  Skin: Negative for itching and rash.  Neurological: Negative for tremors, sensory change, speech change, focal weakness and headaches.  Psychiatric/Behavioral: Negative for depression.    DRUG ALLERGIES:   Allergies  Allergen Reactions  . Metformin Diarrhea  . Oxycodone-Acetaminophen Nausea And Vomiting  . Oxycodone-Acetaminophen Nausea Only  . Amoxicillin Rash and Other (See Comments)    Has patient had a PCN reaction causing immediate rash, facial/tongue/throat swelling, SOB or lightheadedness with hypotension: No Has patient had a PCN reaction causing severe rash involving mucus membranes or skin necrosis: No Has patient had a PCN reaction that required hospitalization No Has patient had a PCN reaction occurring within the last  10 years: No If all of the above answers are "NO", then may proceed with Cephalosporin use.    VITALS:  Blood pressure (!) 138/56, pulse 92, temperature (!) 97.5 F (36.4 C), temperature source Oral, resp. rate 20, height 5\' 2"  (1.575 m), weight 96.2 kg, SpO2 97 %.  PHYSICAL EXAMINATION:  GENERAL:  84 y.o.-year-old patient lying in the bed with no acute distress.  EYES: Pupils equal, round, reactive to light . No scleral icterus. Extraocular muscles intact.  HEENT: Head atraumatic, normocephalic. Oropharynx and nasopharynx clear.  NECK:  Supple, no jugular venous distention. No thyroid enlargement, no tenderness.  LUNGS: Normal breath sounds bilaterally, no wheezing, rales,rhonchi or crepitation. No use of accessory muscles of respiration.  CARDIOVASCULAR: S1, S2 normal. No murmurs, rubs, or gallops.  ABDOMEN: Soft, nontender, nondistended. Bowel sounds present. No organomegaly or mass.  EXTREMITIES: Positive pedal edema, cyanosis, or clubbing.  NEUROLOGIC: Patient is alert and oriented today she moves upper limbs slowly does not move her lower limbs much.  Follows simple command and join in communication. PSYCHIATRIC: The patient is alert and oriented x3.  SKIN: Patient has decubitus ulcer on sacrum and ischial area.  Physical Exam LABORATORY PANEL:   CBC Recent Labs  Lab 04/02/18 0515  WBC 9.9  HGB 10.2*  HCT 31.0*  PLT 252   ------------------------------------------------------------------------------------------------------------------  Chemistries  Recent Labs  Lab 03/31/18 1824  04/02/18 0515  NA 137   < > 145  K 6.3*   < > 4.8  CL 103   < > 119*  CO2 21*   < > 20*  GLUCOSE 253*   < > 169*  BUN 120*   < > 89*  CREATININE 3.76*   < > 1.82*  CALCIUM 9.6   < > 9.0  AST 65*  --   --   ALT 49*  --   --   ALKPHOS 122  --   --   BILITOT 0.6  --   --    < > = values in this interval not displayed.    ------------------------------------------------------------------------------------------------------------------  Cardiac Enzymes Recent Labs  Lab 03/31/18 1824 03/31/18 2123  TROPONINI 0.05* 0.05*   ------------------------------------------------------------------------------------------------------------------  RADIOLOGY:  No results found.  ASSESSMENT AND PLAN:   Principal Problem:   UTI (urinary tract infection) Active Problems:   Deep vein thrombosis (DVT) of lower extremity (HCC)   Acid reflux   Essential (primary) hypertension   Hyperkalemia   Pressure injury of skin  * UTI (urinary tract infection) -IV antibiotics initiated, urine culture sent  *  Acute encephalopathy -due to her UTI, dehydration and renal failure, uremia-  treatment as above,   *Decubitus ulcer-as per wound care consult this has slough up to the bones and needs surgical debridement.  Surgical consult called in. They did localize debridement and applied dressing, ulcer was stage III as per them.  *  Hyperkalemia -patient was given IV calcium, insulin with D50, and Veltassa in the ED.   her potassium came down in safe range, still high.  Will follow with IV fluids now.  *Acute on chronic renal failure We will give IV fluid and monitor renal function. Much improved.  *  Essential (primary) hypertension -stable, continue home meds  *Diabetes Keep on sliding scale coverage for now.  *  Deep vein thrombosis (DVT) of lower extremity (HCC) -patient is on Xarelto, but with current renal function I will hold this medication  She may need surgical debridement on her decubitus ulcers so we will keep holding and keep on subcu heparin for DVT prophylaxis.  *   Acid reflux -Home dose PPI   All the records are reviewed and case discussed with Care Management/Social Workerr. Management plans discussed with the patient, family and they are in agreement.  CODE STATUS: partial.  TOTAL TIME TAKING  CARE OF THIS PATIENT: 35 minutes.   Spoke to surgical team.  Husband was in the room today.  POSSIBLE D/C IN 2-3 DAYS, DEPENDING ON CLINICAL CONDITION.   Vaughan Basta M.D on 04/02/2018   Between 7am to 6pm - Pager - [phone]  After 6pm go to [email or web address] - password [email or web address] [facility]  Office  [phone]  CC: Primary care physician; Adin Hector, MD  Note: This dictation was prepared with Dragon dictation along with smaller phrase technology. Any transcriptional errors that result from this process are unintentional.

## 2018-04-02 NOTE — Progress Notes (Signed)
CCMD called to report patient had a 2.09 sec pause. Patient resting in bed, no complaints. Dr. Anselm Jungling notified. Will continue to monitor patient.

## 2018-04-03 LAB — BASIC METABOLIC PANEL
ANION GAP: 5 (ref 5–15)
BUN: 49 mg/dL — ABNORMAL HIGH (ref 8–23)
CALCIUM: 8.9 mg/dL (ref 8.9–10.3)
CO2: 22 mmol/L (ref 22–32)
Chloride: 119 mmol/L — ABNORMAL HIGH (ref 98–111)
Creatinine, Ser: 1.01 mg/dL — ABNORMAL HIGH (ref 0.44–1.00)
GFR calc Af Amer: 57 mL/min — ABNORMAL LOW (ref >60)
GFR, EST NON AFRICAN AMERICAN: 49 mL/min — AB (ref >60)
Glucose, Bld: 154 mg/dL — ABNORMAL HIGH (ref 70–99)
Potassium: 4.6 mmol/L (ref 3.5–5.1)
SODIUM: 146 mmol/L — AB (ref 135–145)

## 2018-04-03 LAB — CBC
HCT: 30.2 % — ABNORMAL LOW (ref 35.0–47.0)
HEMOGLOBIN: 10.2 g/dL — AB (ref 12.0–16.0)
MCH: 31.6 pg (ref 26.0–34.0)
MCHC: 33.8 g/dL (ref 32.0–36.0)
MCV: 93.6 fL (ref 80.0–100.0)
PLATELETS: 241 10*3/uL (ref 150–440)
RBC: 3.22 MIL/uL — ABNORMAL LOW (ref 3.80–5.20)
RDW: 16.8 % — ABNORMAL HIGH (ref 11.5–14.5)
WBC: 7.2 10*3/uL (ref 3.6–11.0)

## 2018-04-03 LAB — GLUCOSE, CAPILLARY
GLUCOSE-CAPILLARY: 141 mg/dL — AB (ref 70–99)
GLUCOSE-CAPILLARY: 185 mg/dL — AB (ref 70–99)
Glucose-Capillary: 184 mg/dL — ABNORMAL HIGH (ref 70–99)
Glucose-Capillary: 207 mg/dL — ABNORMAL HIGH (ref 70–99)

## 2018-04-03 MED ORDER — METOPROLOL TARTRATE 25 MG PO TABS
25.0000 mg | ORAL_TABLET | Freq: Two times a day (BID) | ORAL | Status: DC
  Administered 2018-04-03 – 2018-04-04 (×3): 25 mg via ORAL
  Filled 2018-04-03 (×3): qty 1

## 2018-04-03 NOTE — Progress Notes (Signed)
Patient has been confused all throughout the shift. Patient was pulling off her leads and pulling at her foley catheter. Safety mitts were applied. Patient seems happy with the mitts saying, "They are keeping me warm". Patient continues to receive IV ATB and takes her meds crushed in applesauce. Patient's friend has been at bedside most of the day. Safety maintained. Will continue to monitor.

## 2018-04-03 NOTE — Progress Notes (Signed)
[facility] at [place] NAME: [name]    MR#:  [id number]  DATE OF BIRTH:  1928-08-15  SUBJECTIVE:  CHIEF COMPLAINT:   Chief Complaint  Patient presents with  . Weakness  . Hyperkalemia   Sent from the nursing home because of encephalopathy and possible UTI.  Patient was admitted to hospital with decubitus ulcer and infection last month.  Also noted to have a very bad decubitus ulcer this time.  Status post debridement by surgical team on her sacral wounds, much alert and oriented today.  No complaints. Eating good.  REVIEW OF SYSTEMS:     Review of Systems  Constitutional: Positive for malaise/fatigue. Negative for fever.  HENT: Negative for congestion, ear discharge, nosebleeds, sore throat and tinnitus.   Eyes: Negative for double vision, photophobia and discharge.  Respiratory: Negative for cough, sputum production and shortness of breath.   Cardiovascular: Negative for chest pain, palpitations and leg swelling.  Gastrointestinal: Negative for abdominal pain, blood in stool, heartburn, nausea and vomiting.  Genitourinary: Negative for frequency and urgency.  Musculoskeletal: Negative for back pain and neck pain.  Skin: Negative for itching and rash.  Neurological: Negative for tremors, sensory change, speech change, focal weakness and headaches.  Psychiatric/Behavioral: Negative for depression.    DRUG ALLERGIES:   Allergies  Allergen Reactions  . Metformin Diarrhea  . Oxycodone-Acetaminophen Nausea And Vomiting  . Oxycodone-Acetaminophen Nausea Only  . Amoxicillin Rash and Other (See Comments)    Has patient had a PCN reaction causing immediate rash, facial/tongue/throat swelling, SOB or lightheadedness with hypotension: No Has patient had a PCN reaction causing severe rash involving mucus membranes or skin necrosis: No Has patient had a PCN reaction that required hospitalization No Has patient had a PCN reaction occurring  within the last 10 years: No If all of the above answers are "NO", then may proceed with Cephalosporin use.    VITALS:  Blood pressure (!) 156/71, pulse (!) 108, temperature 97.6 F (36.4 C), temperature source Oral, resp. rate 20, height 5\' 2"  (1.575 m), weight 96.2 kg, SpO2 98 %.  PHYSICAL EXAMINATION:  GENERAL:  84 y.o.-year-old patient lying in the bed with no acute distress.  EYES: Pupils equal, round, reactive to light . No scleral icterus. Extraocular muscles intact.  HEENT: Head atraumatic, normocephalic. Oropharynx and nasopharynx clear.  NECK:  Supple, no jugular venous distention. No thyroid enlargement, no tenderness.  LUNGS: Normal breath sounds bilaterally, no wheezing, rales,rhonchi or crepitation. No use of accessory muscles of respiration.  CARDIOVASCULAR: S1, S2 normal. No murmurs, rubs, or gallops.  ABDOMEN: Soft, nontender, nondistended. Bowel sounds present. No organomegaly or mass.  EXTREMITIES: Positive pedal edema, cyanosis, or clubbing.  NEUROLOGIC: Patient is alert and oriented today she moves upper limbs slowly does not move her lower limbs much.  Follows simple command and join in communication. PSYCHIATRIC: The patient is alert and oriented x3.  SKIN: Patient has decubitus ulcer on sacrum and ischial area.  Physical Exam LABORATORY PANEL:   CBC Recent Labs  Lab 04/03/18 0523  WBC 7.2  HGB 10.2*  HCT 30.2*  PLT 241   ------------------------------------------------------------------------------------------------------------------  Chemistries  Recent Labs  Lab 03/31/18 1824  04/03/18 0523  NA 137   < > 146*  K 6.3*   < > 4.6  CL 103   < > 119*  CO2 21*   < > 22  GLUCOSE 253*   < > 154*  BUN 120*   < >  49*  CREATININE 3.76*   < > 1.01*  CALCIUM 9.6   < > 8.9  AST 65*  --   --   ALT 49*  --   --   ALKPHOS 122  --   --   BILITOT 0.6  --   --    < > = values in this interval not displayed.    ------------------------------------------------------------------------------------------------------------------  Cardiac Enzymes Recent Labs  Lab 03/31/18 1824 03/31/18 2123  TROPONINI 0.05* 0.05*   ------------------------------------------------------------------------------------------------------------------  RADIOLOGY:  No results found.  ASSESSMENT AND PLAN:   Principal Problem:   UTI (urinary tract infection) Active Problems:   Deep vein thrombosis (DVT) of lower extremity (HCC)   Acid reflux   Essential (primary) hypertension   Hyperkalemia   Pressure injury of skin  * UTI (urinary tract infection) -IV antibiotics initiated, urine culture still not sent, in spite of repeated orders. As now it is already treated for > 2 days we will treat empirically.  *  Acute encephalopathy -due to her UTI, dehydration and renal failure, uremia-  treatment as above, much improved, completely alert and oriented now.  *Decubitus ulcer-as per wound care consult this has slough up to the bones and needs surgical debridement.  Surgical consult called in. They did localize debridement and applied dressing, ulcer was stage III as per them.  *  Hyperkalemia -patient was given IV calcium, insulin with D50, and Veltassa in the ED.   her potassium came down in safe range, still high.  Will follow with IV fluids now.  *Acute on chronic renal failure We will give IV fluid and monitor renal function. Much improved.  *  Essential (primary) hypertension -stable, continue home meds  *Diabetes Keep on sliding scale coverage for now.  *  Deep vein thrombosis (DVT) of lower extremity (HCC) -patient is on Xarelto, but with current renal function I will hold this medication  She may need surgical debridement on her decubitus ulcers so we will keep holding and keep on subcu heparin for DVT prophylaxis.  *   Acid reflux -Home dose PPI   All the records are reviewed and case discussed with  Care Management/Social Workerr. Management plans discussed with the patient, family and they are in agreement.  CODE STATUS: partial.  TOTAL TIME TAKING CARE OF THIS PATIENT: 35 minutes.   Spoke to surgical team.  Husband was in the room today.  POSSIBLE D/C IN 1-2 DAYS, DEPENDING ON CLINICAL CONDITION.   Vaughan Basta M.D on 04/03/2018   Between 7am to 6pm - Pager - [phone]  After 6pm go to [email or web address] - password [email or web address] [facility]  Office  [phone]  CC: Primary care physician; Adin Hector, MD  Note: This dictation was prepared with Dragon dictation along with smaller phrase technology. Any transcriptional errors that result from this process are unintentional.

## 2018-04-04 LAB — GLUCOSE, CAPILLARY
Glucose-Capillary: 144 mg/dL — ABNORMAL HIGH (ref 70–99)
Glucose-Capillary: 213 mg/dL — ABNORMAL HIGH (ref 70–99)

## 2018-04-04 MED ORDER — DAKINS (1/4 STRENGTH) 0.125 % EX SOLN
Freq: Three times a day (TID) | CUTANEOUS | Status: DC
  Filled 2018-04-04: qty 473

## 2018-04-04 MED ORDER — METOPROLOL TARTRATE 50 MG PO TABS: 50.0000 mg | ORAL_TABLET | Freq: Two times a day (BID) | ORAL | 0 refills | Status: AC

## 2018-04-04 MED ORDER — METOPROLOL TARTRATE 50 MG PO TABS: 50.0000 mg | ORAL_TABLET | Freq: Two times a day (BID) | ORAL | Status: DC

## 2018-04-04 MED ORDER — CEPHALEXIN 500 MG PO CAPS: 500.0000 mg | ORAL_CAPSULE | Freq: Two times a day (BID) | ORAL | 0 refills | Status: AC

## 2018-04-04 NOTE — Care Management Important Message (Signed)
Important Message  Patient Details  Name: [name] MRN: [id number] Date of Birth: 1928-09-25   Medicare Important Message Given:  Yes    Elza Rafter, RN 04/04/2018, 1:35 PM

## 2018-04-04 NOTE — Care Management Note (Signed)
Case Management Note  Patient Details  Name: KEIONA JENISON MRN: [id number] Date of Birth: Sep 20, 1928  Subjective/Objective:     Patient to discharge to [facility] today.  No further needs .                 Action/Plan:   Expected Discharge Date:  04/04/18               Expected Discharge Plan:     In-House Referral:     Discharge planning Services     Post Acute Care Choice:    Choice offered to:     DME Arranged:    DME Agency:     HH Arranged:    HH Agency:     Status of Service:     If discussed at [employer] [facility] [employer], dates discussed:    Additional Comments:  Elza Rafter, RN 04/04/2018, 1:37 PM

## 2018-04-04 NOTE — Progress Notes (Signed)
Report called to [place] at [facility]. EMS transport called to transport patient. Husband in room and updated about plan. Husband to take belongings with him [facility]. Awaiting transport now.

## 2018-04-04 NOTE — Consult Note (Signed)
[facility] nurse follow up, patient has had surgical consultation with bedside debridement and I have updated the wound care orders based on Dr. Mont Dutton note.  Will need TID 1/4 strength Dakins for 3 more days and then will need orders updated to saline moist gauze wet to dry dressings. I have entered those to start on  04/07/18.  Follow up with wound care center of the patient's choice per Dr. Mont Dutton note.  Will need air mattress at home to assist with pressure redistribution and moisture management in this now bedbound patient.    Re consult if needed, will not follow at this time. Thanks  Molli Gethers [employer], RN,CWOCN, CNS, [place] [phone])

## 2018-04-04 NOTE — Clinical Social Work Note (Signed)
Patient is medically ready for discharge today. CSW notified patient's husband Dung Prien [phone]. CSW also notified Tina at [facility]. Patient will be transported by EMS. RN to call report and call for transport.   [place], [place]

## 2018-04-04 NOTE — Discharge Instructions (Signed)
Recommend doing dressing changes with wet-to-dry gauze using Dakin's solution as recommended by the wound nurse.  This should be done twice a day for the next 5 days and then can be changed to wet to dry with saline instead of Dakin's solution, also twice daily afterwards in order to help as much as possible with cleaning of the wound.  She should follow up with the Wound Care center upon discharge.

## 2018-04-04 NOTE — Discharge Summary (Signed)
[facility] NAME: [name]    MR#:  [id number]  DATE OF BIRTH:  07/18/1928  DATE OF ADMISSION:  03/31/2018 ADMITTING PHYSICIAN: Sela Hua, MD  DATE OF DISCHARGE: 04/04/2018   PRIMARY CARE PHYSICIAN: Adin Hector, MD    ADMISSION DIAGNOSIS:  Hyperkalemia [E87.5] Hyperglycemia [R73.9] Elevated troponin [R74.8] Acute renal failure, unspecified acute renal failure type ([place]) [N17.9]  DISCHARGE DIAGNOSIS:  Principal Problem:   UTI (urinary tract infection) Active Problems:   Deep vein thrombosis (DVT) of lower extremity (HCC)   Acid reflux   Essential (primary) hypertension   Hyperkalemia   Pressure injury of skin   SECONDARY DIAGNOSIS:   Past Medical History:  Diagnosis Date  . Arthritis   . Basal cell carcinoma 2019   LLE  . Breast cancer ([place]) 2014   right- radiation. Dr Pat Patrick  . Diabetes mellitus without complication ([place])   . Gastric ulcer   . Glaucoma   . Hiatal hernia   . Hypertension   . Osteoporosis   . Recurrent deep vein thrombosis (DVT) [facility])     HOSPITAL COURSE:   * UTI (urinary tract infection) -IV antibiotics initiated, urine culture still not sent, in spite of repeated orders. As now it is already treated for > 2 days we will treat empirically. will give keflex on discharge.  *Acute encephalopathy-due to her UTI, dehydration and renal failure, uremia-  treatment as above, much improved, completely alert and oriented now.  *Decubitus ulcer-as per wound care consult this has slough up to the bones and needs surgical debridement.  Surgical consult called in. They did localize debridement and applied dressing, ulcer was stage III as per them.  Advise about dressing change provided.  *Hyperkalemia -patient was given IV calcium, insulin with D50, and [place] in the ED.  her potassium came down in safe range, still high.  Will follow with IV fluids now.  *Acute on  chronic renal failure We will give IV fluid and monitor renal function. Much improved. Normal now.  Will stop lisinopril due to risk of renal impairment again.  *Essential (primary) hypertension -stable, continue home meds  Changed from lisinopril to metoprolol to help with Heart rate also.  *Diabetes Keep on sliding scale coverage for now.  * Deep vein thrombosis (DVT) of lower extremity (HCC) -patient is on Xarelto, but with current renal function I will hold this medication  She may need surgical debridement on her decubitus ulcers so we will keep holding and keep on subcu heparin for DVT prophylaxis.  * Acid reflux -Home dose PPI  DISCHARGE CONDITIONS:   Stable.  CONSULTS OBTAINED:    DRUG ALLERGIES:   Allergies  Allergen Reactions  . Metformin Diarrhea  . Oxycodone-Acetaminophen Nausea And Vomiting  . Oxycodone-Acetaminophen Nausea Only  . Amoxicillin Rash and Other (See Comments)    Has patient had a PCN reaction causing immediate rash, facial/tongue/throat swelling, SOB or lightheadedness with hypotension: No Has patient had a PCN reaction causing severe rash involving mucus membranes or skin necrosis: No Has patient had a PCN reaction that required hospitalization No Has patient had a PCN reaction occurring within the last 10 years: No If all of the above answers are "NO", then may proceed with Cephalosporin use.    DISCHARGE MEDICATIONS:   Allergies as of 04/04/2018      Reactions   Metformin Diarrhea   Oxycodone-acetaminophen Nausea And Vomiting   Oxycodone-acetaminophen Nausea Only  Amoxicillin Rash, Other (See Comments)   Has patient had a PCN reaction causing immediate rash, facial/tongue/throat swelling, SOB or lightheadedness with hypotension: No Has patient had a PCN reaction causing severe rash involving mucus membranes or skin necrosis: No Has patient had a PCN reaction that required hospitalization No Has patient had a PCN reaction  occurring within the last 10 years: No If all of the above answers are "NO", then may proceed with Cephalosporin use.      Medication List    STOP taking these medications   atenolol 25 MG tablet Commonly known as:  TENORMIN   lisinopril 30 MG tablet Commonly known as:  PRINIVIL,ZESTRIL     TAKE these medications   acetaminophen 325 MG tablet Commonly known as:  TYLENOL Take 2 tablets (650 mg total) by mouth every 6 (six) hours as needed for mild pain (or Fever >/= 101).   allopurinol 100 MG tablet Commonly known as:  ZYLOPRIM Take 200 mg by mouth daily.   ascorbic acid 500 MG tablet Commonly known as:  VITAMIN C Take 1,000 mg by mouth daily.   aspirin EC 81 MG tablet Take 81 mg by mouth every evening.   bisacodyl 5 MG EC tablet Generic drug:  bisacodyl Take 5 mg by mouth daily as needed for moderate constipation.   cephALEXin 500 MG capsule Commonly known as:  KEFLEX Take 1 capsule (500 mg total) by mouth 2 (two) times daily for 3 days.   cholecalciferol 1000 units tablet Commonly known as:  VITAMIN D Take 1,000 Units by mouth daily.   clonazepam 0.125 MG disintegrating tablet Commonly known as:  KLONOPIN Take 0.125 mg by mouth 2 (two) times daily.   collagenase ointment Commonly known as:  SANTYL Apply 1 application topically 2 (two) times daily. Pressure ulcer of sacral region   DULoxetine 30 MG capsule Commonly known as:  CYMBALTA Take 90 mg by mouth daily.   folic acid 1 MG tablet Commonly known as:  FOLVITE Take 1 tablet by mouth every evening.   insulin aspart 100 UNIT/ML injection Commonly known as:  novoLOG Inject 0-9 Units into the skin 3 (three) times daily with meals. CBG < 70: implement hypoglycemia protocol CBG 70 - 120: 0 units CBG 121 - 150: 1 unit CBG 151 - 200: 2 units CBG 201 - 250: 3 units CBG 251 - 300: 5 units CBG 301 - 350: 7 units CBG 351 - 400: 9 units CBG > 400: call MD and obtain STAT lab verification   insulin aspart  100 UNIT/ML injection Commonly known as:  novoLOG Inject 0-5 Units into the skin at bedtime. CBG < 70: implement hypoglycemia protocol CBG 70 - 120: 0 units CBG 121 - 150: 0 units CBG 151 - 200: 0 units CBG 201 - 250: 2 units CBG 251 - 300: 3 units CBG 301 - 350: 4 units CBG 351 - 400: 5 units CBG > 400: call MD and obtain STAT lab verification   ketoconazole 2 % cream Commonly known as:  NIZORAL Apply 1 application topically 2 (two) times daily.   latanoprost 0.005 % ophthalmic solution Commonly known as:  XALATAN Place 1 drop into both eyes at bedtime.   metoprolol tartrate 50 MG tablet Commonly known as:  LOPRESSOR Take 1 tablet (50 mg total) by mouth 2 (two) times daily.   modafinil 100 MG tablet Commonly known as:  PROVIGIL Take 100 mg by mouth 2 (two) times daily.   multivitamin with minerals Tabs tablet Take  1 tablet by mouth daily.   multivitamin-lutein Caps capsule Take 1 capsule by mouth at bedtime.   nystatin powder Commonly known as:  MYCOSTATIN/NYSTOP Apply topically 2 (two) times daily.   omeprazole 20 MG capsule Commonly known as:  PRILOSEC Take 20 mg by mouth daily as needed (GERD).   ondansetron 4 MG tablet Commonly known as:  ZOFRAN Take 1 tablet (4 mg total) by mouth every 6 (six) hours as needed for nausea.   OSTEO BI-FLEX ADV DOUBLE ST PO Take 1 tablet by mouth daily.   OYSTER SHELL CALCIUM 500 + D 500-125 MG-UNIT Tabs Generic drug:  Calcium Carbonate-Vitamin D Take 1 tablet by mouth daily.   polyethylene glycol packet Commonly known as:  MIRALAX / GLYCOLAX Take 17 g by mouth daily as needed for mild constipation. What changed:  when to take this   protein supplement shake Liqd Commonly known as:  PREMIER PROTEIN Take 325 mLs (11 oz total) by mouth 2 (two) times daily between meals.   rivaroxaban 20 MG Tabs tablet Commonly known as:  XARELTO Take 20 mg by mouth every morning.   sitaGLIPtin 100 MG tablet Commonly known as:   JANUVIA Take 100 mg by mouth every evening.   traMADol 50 MG tablet Commonly known as:  ULTRAM Take 1 tablet (50 mg total) by mouth every 6 (six) hours as needed for moderate pain.        DISCHARGE INSTRUCTIONS:    Recommend doing dressing changes with wet-to-dry gauze using Dakin's solution as recommended by the wound nurse.  This should be done twice a day for the next 5 days and then can be changed to wet to dry with saline instead of Dakin's solution, also twice daily afterwards in order to help as much as possible with cleaning of the wound.  She should follow up with the Wound Care center upon discharge.   If you experience worsening of your admission symptoms, develop shortness of breath, life threatening emergency, suicidal or homicidal thoughts you must seek medical attention immediately by calling 911 or calling your MD immediately  if symptoms less severe.  You Must read complete instructions/literature along with all the possible adverse reactions/side effects for all the Medicines you take and that have been prescribed to you. Take any new Medicines after you have completely understood and accept all the possible adverse reactions/side effects.   Please note  You were cared for by a hospitalist during your hospital stay. If you have any questions about your discharge medications or the care you received while you were in the hospital after you are discharged, you can call the unit and asked to speak with the hospitalist on call if the hospitalist that took care of you is not available. Once you are discharged, your primary care physician will handle any further medical issues. Please note that NO REFILLS for any discharge medications will be authorized once you are discharged, as it is imperative that you return to your primary care physician (or establish a relationship with a primary care physician if you do not have one) for your aftercare needs so that they can reassess your  need for medications and monitor your lab values.    Today   CHIEF COMPLAINT:   Chief Complaint  Patient presents with  . Weakness  . Hyperkalemia    HISTORY OF PRESENT ILLNESS:  [name]  is a 84 y.o. female presents with chief complaint as above.  Patient is unable to contribute to her  history due to acute encephalopathy from her UTI.  History is taken from her husband who is at bedside.  He states that she has been at the nursing facility for the past 3 weeks, and has been steadily declining over that time.  He states that her appetite has been poor, and she has had significantly decreased p.o. intake.  She was brought tonight for evaluation due to lab work that was done, and she was found to have elevated potassium as well as creatinine.  Labs here in the ED confirmed the same.  Patient is clinically very dehydrated.  Hospitalist were called for admission  VITAL SIGNS:  Blood pressure (!) 149/80, pulse 99, temperature 97.7 F (36.5 C), temperature source Oral, resp. rate 18, height 5\' 2"  (1.575 m), weight 96.2 kg, SpO2 98 %.  I/O:    Intake/Output Summary (Last 24 hours) at 04/04/2018 1237 Last data filed at 04/04/2018 0900 Gross per 24 hour  Intake 240 ml  Output 700 ml  Net -460 ml    PHYSICAL EXAMINATION:   GENERAL:  84 y.o.-year-old patient lying in the bed with no acute distress.  EYES: Pupils equal, round, reactive to light . No scleral icterus. Extraocular muscles intact.  HEENT: Head atraumatic, normocephalic. Oropharynx and nasopharynx clear.  NECK:  Supple, no jugular venous distention. No thyroid enlargement, no tenderness.  LUNGS: Normal breath sounds bilaterally, no wheezing, rales,rhonchi or crepitation. No use of accessory muscles of respiration.  CARDIOVASCULAR: S1, S2 normal. No murmurs, rubs, or gallops.  ABDOMEN: Soft, nontender, nondistended. Bowel sounds present. No organomegaly or mass.  EXTREMITIES: Positive pedal edema, cyanosis, or clubbing.   NEUROLOGIC: Patient is alert and oriented today she moves upper limbs slowly does not move her lower limbs much.  Follows simple command and join in communication. PSYCHIATRIC: The patient is alert and oriented x3.  SKIN: Patient has decubitus ulcer on sacrum and ischial area  DATA REVIEW:   CBC Recent Labs  Lab 04/03/18 0523  WBC 7.2  HGB 10.2*  HCT 30.2*  PLT 241    Chemistries  Recent Labs  Lab 03/31/18 1824  04/03/18 0523  NA 137   < > 146*  K 6.3*   < > 4.6  CL 103   < > 119*  CO2 21*   < > 22  GLUCOSE 253*   < > 154*  BUN 120*   < > 49*  CREATININE 3.76*   < > 1.01*  CALCIUM 9.6   < > 8.9  AST 65*  --   --   ALT 49*  --   --   ALKPHOS 122  --   --   BILITOT 0.6  --   --    < > = values in this interval not displayed.    Cardiac Enzymes Recent Labs  Lab 03/31/18 2123  TROPONINI 0.05*    Microbiology Results  Results for orders placed or performed during the hospital encounter of 03/31/18  Blood culture (routine x 2)     Status: None (Preliminary result)   Collection Time: 03/31/18  7:31 PM  Result Value Ref Range Status   Specimen Description BLOOD RIGHT UPPER ARM  Final   Special Requests   Final    BOTTLES DRAWN AEROBIC AND ANAEROBIC Blood Culture results may not be optimal due to an excessive volume of blood received in culture bottles   Culture   Final    NO GROWTH 4 DAYS Performed at [facility], [address] [place], [place] [address]    Report Status PENDING  Incomplete  Blood culture (routine x 2)     Status: None (Preliminary result)   Collection Time: 03/31/18  8:25 PM  Result Value Ref Range Status   Specimen Description BLOOD RIGHT HAND  Final   Special Requests   Final    BOTTLES DRAWN AEROBIC AND ANAEROBIC Blood Culture results may not be optimal due to an inadequate volume of blood received in culture bottles   Culture   Final    NO GROWTH 4 DAYS Performed at [facility], [address], [place],  [place] [address]    Report Status PENDING  Incomplete  MRSA PCR Screening     Status: Abnormal   Collection Time: 04/01/18 12:13 AM  Result Value Ref Range Status   MRSA by PCR POSITIVE (A) NEGATIVE Final    Comment:        The GeneXpert MRSA Assay (FDA approved for NASAL specimens only), is one component of a comprehensive MRSA colonization surveillance program. It is not intended to diagnose MRSA infection nor to guide or monitor treatment for MRSA infections. RESULT CALLED TO, READ BACK BY AND VERIFIED WITH: C/ALISIA SCOTT @0130  04/01/18 [facility] Performed at [facility], [address] [place], [place] [address]     RADIOLOGY:  No results found.  EKG:   Orders placed or performed during the hospital encounter of 03/31/18  . ED EKG  . ED EKG  . EKG 12-Lead  . EKG 12-Lead  . EKG 12-Lead  . EKG 12-Lead      Management plans discussed with the patient, family and they are in agreement.  CODE STATUS:     Code Status Orders  (From admission, onward)         Start     Ordered   04/01/18 0515  Limited resuscitation (code)  Continuous    Question Answer Comment  In the event of cardiac or respiratory ARREST: Initiate Code Blue, Call Rapid Response Yes   In the event of cardiac or respiratory ARREST: Perform CPR Yes   In the event of cardiac or respiratory ARREST: Perform Intubation/Mechanical Ventilation No   In the event of cardiac or respiratory ARREST: Use NIPPV/BiPAp only if indicated Yes   In the event of cardiac or respiratory ARREST: Administer ACLS medications if indicated Yes   In the event of cardiac or respiratory ARREST: Perform Defibrillation or Cardioversion if indicated Yes      04/01/18 0514        Code Status History    Date Active Date Inactive Code Status Order ID Comments User Context   03/31/2018 2319 04/01/2018 0514 Full Code [id number]  Lance Coon, MD Inpatient   03/04/2018 1857 03/08/2018 1849 Full Code [id number]  Sela Hua, MD  Inpatient   10/21/2017 2134 10/24/2017 2051 Full Code [id number]  Henreitta Leber, MD Inpatient   12/13/2015 1638 12/20/2015 1710 Full Code [id number]  Florene Glen, MD ED   06/04/2015 0215 06/06/2015 1632 Full Code [id number]  Marlyce Huge, MD Inpatient    Advance Directive Documentation     Most Recent Value  Type of Advance Directive  Healthcare Power of Attorney  Pre-existing out of facility DNR order (yellow form or pink MOST form)  Pink MOST form placed in chart (order not valid for inpatient use)  "MOST" Form in Place?  -      TOTAL TIME TAKING CARE OF THIS PATIENT: 35 minutes.  Vaughan Basta M.D on 04/04/2018 at 12:37 PM  Between 7am to 6pm - Pager - [phone]  After 6pm go to [email or web address] - password [email or web address] [facility]  Office  [phone]  CC: Primary care physician; Adin Hector, MD   Note: This dictation was prepared with Dragon dictation along with smaller phrase technology. Any transcriptional errors that result from this process are unintentional.

## 2018-04-05 LAB — CULTURE, BLOOD (ROUTINE X 2)
Culture: NO GROWTH
Culture: NO GROWTH

## 2018-04-07 NOTE — Progress Notes (Signed)
Norma, [name] ([id number]) Visit Report for 02/02/2018 HPI Details Patient Name: [name], [name] Date of Service: 02/02/2018 1:15 PM Medical Record Number: [id number] Patient Account Number: [id number] Date of Birth/Sex: January 20, 1929 (84 y.o. F) Treating RN: Cornell Barman Primary Care Provider: Ramonita Lab Other Clinician: Referring Provider: Ramonita Lab Treating Provider/Extender: Tito Dine in Treatment: 7 History of Present Illness HPI Description: ADMISSION 12/15/17; this is an 84 year old woman who lives at home with her husband. She is referred here from her dermatologist Dr. Sarina Ser for review of pressure areas on her bilateral buttock's gluteal folds left ischial tuberosity and labia. She also has absolutely horrible tinea/intertrigo involving her lower abdomen, thighs, peri-area. This is also present in her right inframammary area. The patient is immobilized and her husband's description this is actually been getting worse. 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 She is mostly confined to wheelchair at home and sleeps in a recliner at night and has done this for many years. Not able to get a history of urinary incontinence. Currently they are using cortisone and a and D cream to the wounds and he apparently has ketoconazole to the large areas of intertrigo. She was hospitalized in April with increased knee pain as well as scrotal irritation from tinea/[place] infection. 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 I'm not sure exactly when this was started over this is a predictable interaction which almost precludes joint use of these treatments. She has 6 pressure ulcers on the right and left gluteal folds, upper right and left buttock. Left labia. A stage III wound over the left ischial tuberosity. She also has an area on the  right interior upper thigh just low the gluteal fold. And an area on the right anterior thigh. She has absolutely terrible intertrigo which could be candidal or could be tineal. She also probably has candidal dermatitis under the right greater than left breast. She is in a lot of discomfort with movement. Her husband tells me he is exhausted with the care needs of his wife currently. 12/22/17; since the patient was last here she is only had 2 of the daily doses of Diflucan 100 mg. I did spoken to her primary physician Dr. Caryl Comes he was not comfortable with stopping the Coumadin and therefore substituted Xarelto. This will allow daily dose of Diflucan for 2 weeks. She may require more than this. He is also using topical ketoconazole which I'll prescribe today The patient also has pressure ulcerations on her bilateral buttocks, right ischial tuberosity, left gluteal fold. There is also an ulceration on the posterior part of her labia which is concerning. 01/05/18; the patient is still using Diflucan 100 mg a day and ketoconazole daily. The widespread tinea rash is a lot better but still not resolved. With regards to wound; oThe area on the left upper buttock is resolved right upper buttock/mirror image wound is a lot better oRight ischial tuberosity unchanged in terms of dimensions although the wound surface looks better oPosterior part of the right labia unchanged. I find this area concerning, not a usual pressure area but certainly possible oLeft gluteal fold posteriorly a lot better using silver alginate oRight anterior thigh think this was part of the extensive fungal rash/dermatitis. Wound looks stable though I don't think this is changed a lot. We managed to get this patient on hospital bed but she will  get intermittent insisting on sleeping in a recliner. Her husband is up most night trying to get her to stand up area and I emphasized to her the need to get into bed at night and get used  to sleeping in a bed. I don't see an obvious physical reason for her to be sitting in a recliner. 02/02/18; the patient has not been here in a month. Her husband who is the caregiver is exhausted. Diflucan finished 2 weeks ago and the ketoconazole has finished as well. She is still apparently sleeping in her recliner even though we managed to get this patient in the hospital bed. oLeft upper buttock remains closed oRight ischial tuberosity about the same Zepeda, Makailah L. ([id number]) oThe labia posteriorly is worse I find this concerning. oLeft gluteal fold area looks better. oRight anterior thigh looks stable. Electronic Signature(s) Signed: 02/09/2018 8:05:08 AM By: Linton Ham MD Entered By: Linton Ham on 02/08/2018 20:35:40 Norton, [name] ([id number]) -------------------------------------------------------------------------------- Physical Exam Details Patient Name: [name], [name] Date of Service: 02/02/2018 1:15 PM Medical Record Number: [id number] Patient Account Number: [id number] Date of Birth/Sex: 1928/08/03 (84 y.o. F) Treating RN: Cornell Barman Primary Care Provider: Ramonita Lab Other Clinician: Referring Provider: Ramonita Lab Treating Provider/Extender: Tito Dine in Treatment: 7 Constitutional Patient is hypertensive.. Pulse regular and within target range for patient.[name] Respirations regular, non-labored and within target range.. Temperature is normal and within the target range for the patient.[name] appears in no distress. Notes wound exam oThe superficial stage II on the left upper gluteus is closed oRight is smaller right ischial tuberosity is about the same right thigh is about the same oThere is been improvement in the wide tinea oThe posterior labia is worse. Given surrounding pressure in infection and moisture in this area is possible that this is a pressure area. Nevertheless I have some degree of concern Electronic Signature(s) Signed: 02/09/2018  8:05:08 AM By: Linton Ham MD Entered By: Linton Ham on 02/08/2018 20:38:00 Mathias, [name] ([id number]) -------------------------------------------------------------------------------- Physician Orders Details Patient Name: Sigala, Blenda L. Date of Service: 02/02/2018 1:15 PM Medical Record Number: [id number] Patient Account Number: [id number] Date of Birth/Sex: September 25, 1928 (84 y.o. F) Treating RN: Montey Hora Primary Care Provider: Ramonita Lab Other Clinician: Referring Provider: Ramonita Lab Treating Provider/Extender: Tito Dine in Treatment: 7 Verbal / Phone Orders: No Diagnosis Coding Wound Cleansing Wound #1 Right Gluteus o Clean wound with Normal Saline. Wound #3 Left Gluteal fold o Clean wound with Normal Saline. Wound #5 Left Labia o Clean wound with Normal Saline. Wound #6 Right Gluteal fold o Clean wound with Normal Saline. Wound #7 Right Upper Leg o Clean wound with Normal Saline. Anesthetic (add to Medication List) Wound #1 Right Gluteus o Topical Lidocaine 4% cream applied to wound bed prior to debridement (In Clinic Only). Wound #3 Left Gluteal fold o Topical Lidocaine 4% cream applied to wound bed prior to debridement (In Clinic Only). Wound #5 Left Labia o Topical Lidocaine 4% cream applied to wound bed prior to debridement (In Clinic Only). Wound #6 Right Gluteal fold o Topical Lidocaine 4% cream applied to wound bed prior to debridement (In Clinic Only). Wound #7 Right Upper Leg o Topical Lidocaine 4% cream applied to wound bed prior to debridement (In Clinic Only). Primary Wound Dressing Wound #1 Right Gluteus o Silver Alginate Wound #3 Left Gluteal fold o Silver Alginate Wound #5 Left Labia o Silver Alginate Wound #6 Right Gluteal fold o Silver Alginate Algeo, Sabeen L. ([id number]) Wound #  7 Right Upper Leg o Silver Alginate Secondary Dressing Wound #1 Right Gluteus o Boardered Foam Dressing Wound  #3 Left Gluteal fold o Boardered Foam Dressing Wound #5 Left Labia o Boardered Foam Dressing Wound #6 Right Gluteal fold o Boardered Foam Dressing Wound #7 Right Upper Leg o Boardered Foam Dressing Dressing Change Frequency Wound #1 Right Gluteus o Change Dressing Monday, Wednesday, Friday - [facility]HR[facility] please leave dressing supplies in case a change needs to be made in between [facility] visits. Wound #3 Left Gluteal fold o Change Dressing Monday, Wednesday, Friday [facility] please leave dressing supplies in case a change needs to be made in between [facility] visits. Wound #5 Left Labia o Change Dressing Monday, Wednesday, Friday [facility] please leave dressing supplies in case a change needs to be made in between [facility] visits. Wound #6 Right Gluteal fold o Change Dressing Monday, Wednesday, Friday [facility] please leave dressing supplies in case a change needs to be made in between [facility] visits. Wound #7 Right Upper Leg o Change Dressing Monday, Wednesday, Friday [facility] please leave dressing supplies in case a change needs to be made in between [facility] visits. Follow-up Appointments Wound #1 Right Gluteus o Return Appointment in 2 weeks. Wound #3 Left Gluteal fold o Return Appointment in 2 weeks. Wound #5 Left Labia o Return Appointment in 2 weeks. Wound #6 Right Gluteal fold o Return Appointment in 2 weeks. Wound #7 Right Upper Leg o Return Appointment in 2 weeks. [place], Maheen L. ([id number]) Off-Loading Wound #1 Right Gluteus o Roho cushion for wheelchair o Turn and reposition every 2 hours Wound #3 Left Gluteal fold o Roho cushion for wheelchair o Turn and reposition every 2 hours Wound #5 Left Labia o Roho cushion for wheelchair o Turn and reposition every 2 hours Wound #6 Right Gluteal fold o Roho cushion for wheelchair o Turn and reposition every 2 hours Wound #7 Right Upper Leg o Roho cushion for wheelchair o Turn and reposition every 2  hours Additional Orders / Instructions Wound #1 Right Gluteus o Increase protein intake. Wound #3 Left Gluteal fold o Increase protein intake. Wound #5 Left Labia o Increase protein intake. Wound #6 Right Gluteal fold o Increase protein intake. Wound #7 Right Upper Leg o Increase protein intake. Home Health Wound #1 Right [place] Visits - [facility] - Please send MSW to evaluate patient's home situation and assist with SNF placement o Home Health Nurse may visit PRN to address patientos wound care needs. o FACE TO FACE ENCOUNTER: MEDICARE and MEDICAID PATIENTS: I certify that this patient is under my care and that I had a face-to-face encounter that meets the physician face-to-face encounter requirements with this patient on this date. The encounter with the patient was in whole or in part for the following MEDICAL CONDITION: (primary reason for [place]) MEDICAL NECESSITY: I certify, that based on my findings, NURSING services are a medically necessary home health service. HOME BOUND STATUS: I certify that my clinical findings support that this patient is homebound (i.e., Due to illness or injury, pt requires aid of supportive devices such as crutches, cane, wheelchairs, walkers, the use of special transportation or the assistance of another person to leave their place of residence. There is a normal inability to leave the home and doing so requires considerable and taxing effort. Other absences are for medical reasons / religious services and are infrequent or of short duration when for other reasons). o If current dressing causes regression  in wound condition, may D/C ordered dressing product/s and apply Normal Saline Moist Dressing daily until next [place] / Other MD appointment. [place] of regression in wound condition at [phone]. o Please direct any NON-WOUND related issues/requests for orders to  patient's Primary Care Physician LYNNIX, SCHONEMAN ([id number]) Wound #3 Left Gluteal fold o [place] Visits - [facility] - Please send MSW to evaluate patient's home situation and assist with SNF placement o Home Health Nurse may visit PRN to address patientos wound care needs. o FACE TO FACE ENCOUNTER: MEDICARE and MEDICAID PATIENTS: I certify that this patient is under my care and that I had a face-to-face encounter that meets the physician face-to-face encounter requirements with this patient on this date. The encounter with the patient was in whole or in part for the following MEDICAL CONDITION: (primary reason for [place]) MEDICAL NECESSITY: I certify, that based on my findings, NURSING services are a medically necessary home health service. HOME BOUND STATUS: I certify that my clinical findings support that this patient is homebound (i.e., Due to illness or injury, pt requires aid of supportive devices such as crutches, cane, wheelchairs, walkers, the use of special transportation or the assistance of another person to leave their place of residence. There is a normal inability to leave the home and doing so requires considerable and taxing effort. Other absences are for medical reasons / religious services and are infrequent or of short duration when for other reasons). o If current dressing causes regression in wound condition, may D/C ordered dressing product/s and apply Normal Saline Moist Dressing daily until next [place] / Other MD appointment. [place] of regression in wound condition at [phone]. o Please direct any NON-WOUND related issues/requests for orders to patient's Primary Care Physician Wound #5 Left [place] Visits - [facility] - Please send MSW to evaluate patient's home situation and assist with SNF placement o Home Health Nurse may visit PRN to address patientos wound care needs. o  FACE TO FACE ENCOUNTER: MEDICARE and MEDICAID PATIENTS: I certify that this patient is under my care and that I had a face-to-face encounter that meets the physician face-to-face encounter requirements with this patient on this date. The encounter with the patient was in whole or in part for the following MEDICAL CONDITION: (primary reason for [place]) MEDICAL NECESSITY: I certify, that based on my findings, NURSING services are a medically necessary home health service. HOME BOUND STATUS: I certify that my clinical findings support that this patient is homebound (i.e., Due to illness or injury, pt requires aid of supportive devices such as crutches, cane, wheelchairs, walkers, the use of special transportation or the assistance of another person to leave their place of residence. There is a normal inability to leave the home and doing so requires considerable and taxing effort. Other absences are for medical reasons / religious services and are infrequent or of short duration when for other reasons). o If current dressing causes regression in wound condition, may D/C ordered dressing product/s and apply Normal Saline Moist Dressing daily until next [place] / Other MD appointment. [place] of regression in wound condition at [phone]. o Please direct any NON-WOUND related issues/requests for orders to patient's Primary Care Physician Wound #6 Right Gluteal fold o [place] Visits - [facility] - Please send MSW to evaluate patient's home situation and assist with SNF placement o Home Health Nurse  may visit PRN to address patientos wound care needs. o FACE TO FACE ENCOUNTER: MEDICARE and MEDICAID PATIENTS: I certify that this patient is under my care and that I had a face-to-face encounter that meets the physician face-to-face encounter requirements with this patient on this date. The encounter with the patient was in whole or in part  for the following MEDICAL CONDITION: (primary reason for [place]) MEDICAL NECESSITY: I certify, that based on my findings, NURSING services are a medically necessary home health service. HOME BOUND STATUS: I certify that my clinical findings support that this patient is homebound (i.e., Due to illness or injury, pt requires aid of supportive devices such as crutches, cane, wheelchairs, walkers, the use of special transportation or the assistance of another person to leave their place of residence. There is a normal inability to leave the home and doing so requires considerable and taxing effort. Other absences are for medical reasons / religious services and are infrequent or of short duration when for other reasons). o If current dressing causes regression in wound condition, may D/C ordered dressing product/s and apply Normal Saline Moist Dressing daily until next [place] / Other MD appointment. [place] of regression in wound condition at [phone]. o Please direct any NON-WOUND related issues/requests for orders to patient's Primary Care Physician Wound #7 Right Upper Leg Flavell, Lakena L. ([id number]) o [place] Visits - [facility] - Please send MSW to evaluate patient's home situation and assist with SNF placement o Home Health Nurse may visit PRN to address patientos wound care needs. o FACE TO FACE ENCOUNTER: MEDICARE and MEDICAID PATIENTS: I certify that this patient is under my care and that I had a face-to-face encounter that meets the physician face-to-face encounter requirements with this patient on this date. The encounter with the patient was in whole or in part for the following MEDICAL CONDITION: (primary reason for [place]) MEDICAL NECESSITY: I certify, that based on my findings, NURSING services are a medically necessary home health service. HOME BOUND STATUS: I certify that my clinical findings support that  this patient is homebound (i.e., Due to illness or injury, pt requires aid of supportive devices such as crutches, cane, wheelchairs, walkers, the use of special transportation or the assistance of another person to leave their place of residence. There is a normal inability to leave the home and doing so requires considerable and taxing effort. Other absences are for medical reasons / religious services and are infrequent or of short duration when for other reasons). o If current dressing causes regression in wound condition, may D/C ordered dressing product/s and apply Normal Saline Moist Dressing daily until next [place] / Other MD appointment. [place] of regression in wound condition at [phone]. o Please direct any NON-WOUND related issues/requests for orders to patient's Primary Care Physician Medications-please add to medication list. Wound #1 Right Gluteus o Other: - Ketoconazole cream to reddened areas o Other: - Continue Diflucan for rash Wound #3 Left Gluteal fold o Other: - Ketoconazole cream to reddened areas o Other: - Continue Diflucan for rash Wound #5 Left Labia o Other: - Ketoconazole cream to reddened areas o Other: - Continue Diflucan for rash Wound #6 Right Gluteal fold o Other: - Ketoconazole cream to reddened areas o Other: - Continue Diflucan for rash Wound #7 Right Upper Leg o Other: - Ketoconazole cream to reddened areas o Other: - Continue Diflucan for rash Electronic Signature(s) Signed: 02/08/2018 3:43:22  PM By: Montey Hora Signed: 02/09/2018 8:05:08 AM By: Linton Ham MD Entered By: Montey Hora on 02/08/2018 15:43:16 [place], Mandisa Carlean Jews ([id number]) -------------------------------------------------------------------------------- Problem List Details Patient Name: Abdulaziz, Devan L. Date of Service: 02/02/2018 1:15 PM Medical Record Number: [id number] Patient Account Number: [id number] Date of  Birth/Sex: 1929-04-18 (84 y.o. F) Treating RN: Cornell Barman Primary Care Provider: Ramonita Lab Other Clinician: Referring Provider: Ramonita Lab Treating Provider/Extender: Tito Dine in Treatment: 7 Active Problems ICD-10 Evaluated Encounter Code Description Active Date Today Diagnosis L89.312 Pressure ulcer of right buttock, stage 2 12/15/2017 Yes Yes Status Complications Interventions Improving N/a continue silver Medical alginate and best Decision attempt at Making : aggressive offloading L89.322 Pressure ulcer of left buttock, stage 2 12/15/2017 Yes Yes Status Complications Interventions Medical Improving N/a resolved Decision Making : L97.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 ([id number]) B35.4 Tinea corporis 12/15/2017 Yes Yes Status Complications Interventions Medical Improving dramatically improved. Widespread tinea infection in the oI have renewed the Decision wound areas could impede wound healing Diflucan Making : S31.502D Unspecified open wound of unspecified external genital 12/15/2017 Yes Yes organs, female, subsequent encounter Status Complications Interventions Worsening as more substantial given the situation is possible this is a pressure ulcer on Medical the posterior aspect Decision of her right labia. Making : Close monitoring for progression. This would dictate a biopsy Inactive Problems Resolved Problems Electronic  Signature(s) Signed: 02/09/2018 8:05:08 AM By: Linton Ham MD Entered By: Linton Ham on 02/08/2018 20:33:02 [place], Nastasha L. ([id number]) -------------------------------------------------------------------------------- Progress Note Details Patient Name: Thom, Galit L. Date of Service: 02/02/2018 1:15 PM Medical Record Number: [id number] Patient Account Number: [id number] Date of Birth/Sex: 10-29-1928 (84 y.o. F) Treating RN: Cornell Barman Primary Care Provider: Ramonita Lab Other Clinician: Referring Provider: Ramonita Lab Treating Provider/Extender: Tito Dine in Treatment: 7 Subjective History of Present Illness (HPI) ADMISSION 12/15/17; this is an 84 year old woman who lives at home with her husband. She is referred here from her dermatologist Dr. Sarina Ser for review of pressure areas on her bilateral buttock's gluteal folds left ischial tuberosity and labia. She also has absolutely horrible tinea/intertrigo involving her lower abdomen, thighs, peri-area. This is also present in her right inframammary area. The patient is immobilized and her husband's description this is actually been getting worse. 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 She is mostly confined to wheelchair at home and sleeps in a recliner at night and has done this for many years. Not able to get a history of urinary incontinence. Currently they are using cortisone and a and D cream to the wounds and he apparently has ketoconazole to the large areas of intertrigo. She was hospitalized in April with increased knee pain as well as scrotal irritation from tinea/[place] infection. 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 I'm not sure exactly when this was started over this is a predictable interaction which almost precludes joint use of these  treatments. She has 6  pressure ulcers on the right and left gluteal folds, upper right and left buttock. Left labia. A stage III wound over the left ischial tuberosity. She also has an area on the right interior upper thigh just low the gluteal fold. And an area on the right anterior thigh. She has absolutely terrible intertrigo which could be candidal or could be tineal. She also probably has candidal dermatitis under the right greater than left breast. She is in a lot of discomfort with movement. Her husband tells me he is exhausted with the care needs of his wife currently. 12/22/17; since the patient was last here she is only had 2 of the daily doses of Diflucan 100 mg. I did spoken to her primary physician Dr. Caryl Comes he was not comfortable with stopping the Coumadin and therefore substituted Xarelto. This will allow daily dose of Diflucan for 2 weeks. She may require more than this. He is also using topical ketoconazole which I'll prescribe today The patient also has pressure ulcerations on her bilateral buttocks, right ischial tuberosity, left gluteal fold. There is also an ulceration on the posterior part of her labia which is concerning. 01/05/18; the patient is still using Diflucan 100 mg a day and ketoconazole daily. The widespread tinea rash is a lot better but still not resolved. With regards to wound; The area on the left upper buttock is resolved right upper buttock/mirror image wound is a lot better Right ischial tuberosity unchanged in terms of dimensions although the wound surface looks better Posterior part of the right labia unchanged. I find this area concerning, not a usual pressure area but certainly possible Left gluteal fold posteriorly a lot better using silver alginate Right anterior thigh think this was part of the extensive fungal rash/dermatitis. Wound looks stable though I don't think this is changed a lot. We managed to get this patient on hospital bed but she will get  intermittent insisting on sleeping in a recliner. Her husband is up most night trying to get her to stand up area and I emphasized to her the need to get into bed at night and get used to sleeping in a bed. I don't see an obvious physical reason for her to be sitting in a recliner. 02/02/18; the patient has not been here in a month. Her husband who is the caregiver is exhausted. Diflucan finished 2 weeks ago and the ketoconazole has finished as well. She is still apparently sleeping in her recliner even though we managed to get this patient in the hospital bed. Left upper buttock remains closed Right ischial tuberosity about the same The labia posteriorly is worse I find this concerning. Left gluteal fold area looks better. [name], [name] ([id number]) Right anterior thigh looks stable. Objective Constitutional Patient is hypertensive.. Pulse regular and within target range for patient.[name] Respirations regular, non-labored and within target range.. Temperature is normal and within the target range for the patient.[name] appears in no distress. Vitals Time Taken: 1:16 PM, Height: 63 in, Weight: 246 lbs, BMI: 43.6, Temperature: 97.6 F, Pulse: 81 bpm, Respiratory Rate: 18 breaths/min, Blood Pressure: 162/56 mmHg. General Notes: wound exam The superficial stage II on the left upper gluteus is closed Right is smaller right ischial tuberosity is about the same right thigh is about the same There is been improvement in the wide tinea The posterior labia is worse. Given surrounding pressure in infection and moisture in this area is possible that this is a pressure area. Nevertheless I have some degree of concern  Integumentary (Hair, Skin) Wound #1 status is Open. Original cause of wound was Pressure Injury. The wound is located on the Right Gluteus. The wound measures 1.3cm length x 0.5cm width x 0.1cm depth; 0.511cm^2 area and 0.051cm^3 volume. Stage II. There is no tunneling or undermining noted. There is  a large amount of serous drainage noted. The wound margin is distinct with the outline attached to the wound base. There is large (67-100%) red granulation within the wound bed. There is a small (1-33%) amount of necrotic tissue within the wound bed including Adherent Slough. The periwound skin appearance exhibited: Ecchymosis. The periwound skin appearance did not exhibit: Erythema. Periwound temperature was noted as No Abnormality. The periwound has tenderness on palpation. Wound #2 status is Healed - Epithelialized. Original cause of wound was Pressure Injury. The wound is located on the Left Gluteus. The wound measures 0cm length x 0cm width x 0cm depth; 0cm^2 area and 0cm^3 volume. Wound #3 status is Open. Original cause of wound was Pressure Injury. The wound is located on the Left Gluteal fold. The wound measures 2.5cm length x 0.5cm width x 0.1cm depth; 0.982cm^2 area and 0.098cm^3 volume. Stage III. There is Fat Layer (Subcutaneous Tissue) Exposed exposed. There is no tunneling or undermining noted. There is a large amount of serous drainage noted. The wound margin is distinct with the outline attached to the wound base. There is medium (34-66%) red granulation within the wound bed. There is a medium (34-66%) amount of necrotic tissue within the wound bed including Adherent Slough. The periwound skin appearance exhibited: Ecchymosis. The periwound skin appearance did not exhibit: Callus, Crepitus, Excoriation, Induration, Rash, Scarring, Dry/Scaly, Maceration, Atrophie Blanche, Cyanosis, Hemosiderin Staining, Mottled, Pallor, Rubor, Erythema. Periwound temperature was noted as No Abnormality. The periwound has tenderness on palpation. Wound #5 status is Open. Original cause of wound was Pressure Injury. The wound is located on the Left Labia. The wound measures 4.2cm length x 0.9cm width x 0.1cm depth; 2.969cm^2 area and 0.297cm^3 volume. There is no tunneling or undermining noted. There  is a large amount of serous drainage noted. There is medium (34-66%) pink granulation within the wound bed. There is a medium (34-66%) amount of necrotic tissue within the wound bed including Adherent Slough. The periwound skin appearance exhibited: Maceration, Ecchymosis. The periwound skin appearance did not exhibit: Callus, Crepitus, Excoriation, Induration, Rash, Scarring, Dry/Scaly, Atrophie Blanche, Cyanosis, Hemosiderin Staining, Mottled, Pallor, Rubor, Erythema. The periwound has tenderness on palpation. Wound #6 status is Open. Original cause of wound was Pressure Injury. The wound is located on the Right Gluteal fold. The wound measures 3cm length x 1.5cm width x 0.1cm depth; 3.534cm^2 area and 0.353cm^3 volume. Stage II. There is Fat Layer (Subcutaneous Tissue) Exposed exposed. There is no tunneling or undermining noted. There is a large amount of serous drainage noted. The wound margin is flat and intact. There is medium (34-66%) pink granulation within the wound [name], [name] ([id number]) bed. There is a medium (34-66%) amount of necrotic tissue within the wound bed including Adherent Slough. The periwound skin appearance did not exhibit: Callus, Crepitus, Excoriation, Induration, Rash, Scarring, Dry/Scaly, Maceration, Atrophie Blanche, Cyanosis, Ecchymosis, Hemosiderin Staining, Mottled, Pallor, Rubor, Erythema. Wound #7 status is Open. Original cause of wound was Gradually Appeared. The wound is located on the Right Upper Leg. The wound measures 3cm length x 3.5cm width x 0.1cm depth; 8.247cm^2 area and 0.825cm^3 volume. There is Fat Layer (Subcutaneous Tissue) Exposed exposed. There is no tunneling or undermining noted. There  is a large amount of serous drainage noted. There is medium (34-66%) pink granulation within the wound bed. There is a medium (34-66%) amount of necrotic tissue within the wound bed including Adherent Slough. The periwound skin appearance did not exhibit:  Callus, Crepitus, Excoriation, Induration, Rash, Scarring, Dry/Scaly, Maceration, Atrophie Blanche, Cyanosis, Ecchymosis, Hemosiderin Staining, Mottled, Pallor, Rubor, Erythema. 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 Wound #3 Left Gluteal fold: Clean wound with Normal Saline. Wound #5 Left Labia: Clean wound with Normal Saline. Wound #6 Right Gluteal fold: Clean wound with Normal Saline. Wound #7 Right Upper Leg: Clean wound with Normal Saline. Anesthetic (add to Medication List): Wound #1 Right Gluteus: Topical Lidocaine 4% cream applied to wound bed prior to debridement (In Clinic Only). Wound #3 Left Gluteal fold: Topical Lidocaine 4% cream applied to wound bed prior to debridement (In Clinic Only). Wound #5 Left Labia: Topical Lidocaine 4% cream applied to wound bed prior to debridement (In Clinic Only). Wound #6 Right Gluteal fold: Topical Lidocaine 4% cream applied to wound bed prior to debridement (In Clinic Only). Wound #7 Right Upper Leg: Topical Lidocaine 4% cream applied to wound bed prior to debridement (In Clinic Only). Primary Wound Dressing: Lefebre, Storie L. ([id number]) Wound #1 Right Gluteus: Silver Alginate Wound #3 Left Gluteal fold: Silver Alginate Wound #5 Left Labia: Silver Alginate Wound #6 Right Gluteal fold: Silver Alginate Wound #7 Right Upper Leg: Silver Alginate Secondary Dressing: Wound #1 Right Gluteus: Boardered Foam Dressing Wound #3 Left Gluteal fold: Boardered Foam Dressing Wound #5 Left  Labia: Boardered Foam Dressing Wound #6 Right Gluteal fold: Boardered Foam Dressing Wound #7 Right Upper Leg: Boardered Foam Dressing Dressing Change Frequency: Wound #1 Right Gluteus: Change Dressing Monday, Wednesday, Friday - HHRN please leave dressing supplies in case a change needs to be made in between [facility] visits. Wound #3 Left Gluteal fold: Change Dressing Monday, Wednesday, Friday [facility] please leave dressing supplies in case a change needs to be made in between [facility] visits. Wound #5 Left Labia: Change Dressing Monday, Wednesday, Friday [facility] please leave dressing supplies in case a change needs to be made in between [facility] visits. Wound #6 Right Gluteal fold: Change Dressing Monday, Wednesday, Friday [facility] please leave dressing supplies in case a change needs to be made in between [facility] visits. Wound #7 Right Upper Leg: Change Dressing Monday, Wednesday, Friday [facility] please leave dressing supplies in case a change needs to be made in between [facility] visits. Follow-up Appointments: Wound #1 Right Gluteus: Return Appointment in 2 weeks. Wound #3 Left Gluteal fold: Return Appointment in 2 weeks. Wound #5 Left Labia: Return Appointment in 2 weeks. Wound #6 Right Gluteal fold: Return Appointment in 2 weeks. Wound #7 Right Upper Leg: Return Appointment in 2 weeks. Off-Loading: Wound #1 Right Gluteus: Roho cushion for wheelchair Turn and reposition every 2 hours Wound #3 Left Gluteal fold: Roho cushion for wheelchair Turn and reposition every 2 hours Wound #5 Left Labia: Roho cushion for wheelchair Turn and reposition every 2 hours Wound #6 Right Gluteal fold: Roho cushion for wheelchair Storlie, [name] ([id number]) Turn and  reposition every 2 hours Wound #7 Right Upper Leg: Roho cushion for wheelchair Turn and reposition every 2 hours Additional Orders / Instructions: Wound #1 Right Gluteus: Increase protein intake. Wound #3 Left Gluteal fold: Increase protein  intake. Wound #5 Left Labia: Increase protein intake. Wound #6 Right Gluteal fold: Increase protein intake. Wound #7 Right Upper Leg: Increase protein intake. Home Health: Wound #1 Right Gluteus: Continue Home Health Visits - [facility] - Please send MSW to evaluate patient's home situation and assist with SNF placement Home Health Nurse may visit PRN to address patient s wound care needs. FACE TO FACE ENCOUNTER: MEDICARE and MEDICAID PATIENTS: I certify that this patient is under my care and that I had a face-to-face encounter that meets the physician face-to-face encounter requirements with this patient on this date. The encounter with the patient was in whole or in part for the following MEDICAL CONDITION: (primary reason for [place]) MEDICAL NECESSITY: I certify, that based on my findings, NURSING services are a medically necessary home health service. HOME BOUND STATUS: I certify that my clinical findings support that this patient is homebound (i.e., Due to illness or injury, pt requires aid of supportive devices such as crutches, cane, wheelchairs, walkers, the use of special transportation or the assistance of another person to leave their place of residence. There is a normal inability to leave the home and doing so requires considerable and taxing effort. Other absences are for medical reasons / religious services and are infrequent or of short duration when for other reasons). If current dressing causes regression in wound condition, may D/C ordered dressing product/s and apply Normal Saline Moist Dressing daily until next [place] / Other MD appointment. [place] of regression in wound condition at [phone]. Please direct any NON-WOUND related issues/requests for orders to patient's Primary Care Physician Wound #3 Left Gluteal fold: [place] Visits - [facility] - Please send MSW to evaluate patient's home situation and assist  with SNF placement Home Health Nurse may visit PRN to address patient s wound care needs. FACE TO FACE ENCOUNTER: MEDICARE and MEDICAID PATIENTS: I certify that this patient is under my care and that I had a face-to-face encounter that meets the physician face-to-face encounter requirements with this patient on this date. The encounter with the patient was in whole or in part for the following MEDICAL CONDITION: (primary reason for [place]) MEDICAL NECESSITY: I certify, that based on my findings, NURSING services are a medically necessary home health service. HOME BOUND STATUS: I certify that my clinical findings support that this patient is homebound (i.e., Due to illness or injury, pt requires aid of supportive devices such as crutches, cane, wheelchairs, walkers, the use of special transportation or the assistance of another person to leave their place of residence. There is a normal inability to leave the home and doing so requires considerable and taxing effort. Other absences are for medical reasons / religious services and are infrequent or of short duration when for other reasons). If current dressing causes regression in wound condition, may D/C ordered dressing product/s and apply Normal Saline Moist Dressing daily until next [place] / Other MD appointment. [place] of regression in wound condition at [phone]. Please direct any NON-WOUND related issues/requests for orders to patient's Primary Care Physician Wound #5 Left Labia: [place] Visits - [facility] - Please send MSW to evaluate patient's home situation and assist with SNF placement Home Health Nurse may  visit PRN to address patient s wound care needs. FACE TO FACE ENCOUNTER: MEDICARE and MEDICAID PATIENTS: I certify that this patient is under my care and that I had a face-to-face encounter that meets the physician face-to-face encounter requirements with this patient on  this date. The encounter with the patient was in whole or in part for the following MEDICAL CONDITION: (primary reason for [place]) MEDICAL NECESSITY: I certify, that based on my findings, NURSING services are a medically necessary home health service. HOME BOUND STATUS: I certify that my clinical findings support that this patient is homebound (i.e., Due to illness or injury, pt requires aid of supportive devices such as crutches, cane, wheelchairs, walkers, the use of special [place], Latona L. ([id number]) transportation or the assistance of another person to leave their place of residence. There is a normal inability to leave the home and doing so requires considerable and taxing effort. Other absences are for medical reasons / religious services and are infrequent or of short duration when for other reasons). If current dressing causes regression in wound condition, may D/C ordered dressing product/s and apply Normal Saline Moist Dressing daily until next [place] / Other MD appointment. [place] of regression in wound condition at [phone]. Please direct any NON-WOUND related issues/requests for orders to patient's Primary Care Physician Wound #6 Right Gluteal fold: [place] Visits - [facility] - Please send MSW to evaluate patient's home situation and assist with SNF placement Home Health Nurse may visit PRN to address patient s wound care needs. FACE TO FACE ENCOUNTER: MEDICARE and MEDICAID PATIENTS: I certify that this patient is under my care and that I had a face-to-face encounter that meets the physician face-to-face encounter requirements with this patient on this date. The encounter with the patient was in whole or in part for the following MEDICAL CONDITION: (primary reason for [place]) MEDICAL NECESSITY: I certify, that based on my findings, NURSING services are a medically necessary home health service. HOME BOUND STATUS: I  certify that my clinical findings support that this patient is homebound (i.e., Due to illness or injury, pt requires aid of supportive devices such as crutches, cane, wheelchairs, walkers, the use of special transportation or the assistance of another person to leave their place of residence. There is a normal inability to leave the home and doing so requires considerable and taxing effort. Other absences are for medical reasons / religious services and are infrequent or of short duration when for other reasons). If current dressing causes regression in wound condition, may D/C ordered dressing product/s and apply Normal Saline Moist Dressing daily until next [place] / Other MD appointment. [place] of regression in wound condition at [phone]. Please direct any NON-WOUND related issues/requests for orders to patient's Primary Care Physician Wound #7 Right Upper Leg: [place] Visits - [facility] - Please send MSW to evaluate patient's home situation and assist with SNF placement Home Health Nurse may visit PRN to address patient s wound care needs. FACE TO FACE ENCOUNTER: MEDICARE and MEDICAID PATIENTS: I certify that this patient is under my care and that I had a face-to-face encounter that meets the physician face-to-face encounter requirements with this patient on this date. The encounter with the patient was in whole or in part for the following MEDICAL CONDITION: (primary reason for [place]) MEDICAL NECESSITY: I certify, that based on my findings, NURSING services are a medically necessary home health service. HOME  BOUND STATUS: I certify that my clinical findings support that this patient is homebound (i.e., Due to illness or injury, pt requires aid of supportive devices such as crutches, cane, wheelchairs, walkers, the use of special transportation or the assistance of another person to leave their place of residence. There is a normal  inability to leave the home and doing so requires considerable and taxing effort. Other absences are for medical reasons / religious services and are infrequent or of short duration when for other reasons). If current dressing causes regression in wound condition, may D/C ordered dressing product/s and apply Normal Saline Moist Dressing daily until next [place] / Other MD appointment. [place] of regression in wound condition at [phone]. Please direct any NON-WOUND related issues/requests for orders to patient's Primary Care Physician Medications-please add to medication list.: Wound #1 Right Gluteus: Other: - Ketoconazole cream to reddened areas Other: - Continue Diflucan for rash Wound #3 Left Gluteal fold: Other: - Ketoconazole cream to reddened areas Other: - Continue Diflucan for rash Wound #5 Left Labia: Other: - Ketoconazole cream to reddened areas Other: - Continue Diflucan for rash Wound #6 Right Gluteal fold: Other: - Ketoconazole cream to reddened areas Other: - Continue Diflucan for rash Wound #7 Right Upper Leg: Other: - Ketoconazole cream to reddened areas Other: - Continue Diflucan for rash Medical Decision Making Pressure ulcer of right buttock, stage 2 12/15/2017 Status: Improving [place], Renata L. ([id number]) Complications: N/a Interventions: continue silver alginate and best attempt at aggressive offloading Pressure ulcer of left buttock, stage 2 12/15/2017 Status: Improving Complications: N/a Interventions: resolved Pressure ulcer of left buttock, stage 3 12/15/2017 Status: Improving Complications: N/a Interventions: continuing with silver alginate based dressings. 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 Widespread tinea infection in the wound areas could impede wound healing Interventions: I have renewed the Diflucan Unspecified open wound of unspecified external genital organs, female, subsequent encounter 12/15/2017 Status: Worsening Complications: as more substantial Interventions: given the situation is possible this is a pressure ulcer on the posterior aspect of her right labia. Close monitoring for progression. This would dictate a biopsy #1 I have renewed the Diflucan #2 silver alginate all open wounds #3careful follow-up of the labia, biopsy may be necessary. #4 the patient's husband and caregiver is exhausted. We will ask a Education officer, museum to go out and look at placement options here. Unfortunately usually the only route here is to spend a qualifying three-day hospital stay Electronic Signature(s) Signed: 03/07/2018 1:53:48 PM By: Gretta Cool, BSN, RN, CWS, Kim RN, BSN Signed: 04/06/2018 7:41:48 AM By: Linton Ham MD Previous Signature: 02/09/2018 8:05:08 AM Version By: Linton Ham MD Entered By: Gretta Cool, BSN, RN, CWS, Kim on 03/07/2018 13:53:48 [place], [name] ([id number]) -------------------------------------------------------------------------------- [place] Details Patient Name: [name], [name] Date of Service: 02/02/2018 Medical Record Number: [id number] Patient Account Number: [id number] Date of Birth/Sex: 02/04/1929 (84 y.o. 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 L89.323 Pressure ulcer of left buttock, stage 3 L97.111 Non-pressure chronic ulcer of right thigh  limited to breakdown of skin B35.4 Tinea corporis S31.502D Unspecified open wound of unspecified external genital organs, female, subsequent encounter Facility Procedures CPT4 Code: [id number] Description: [phone] - WOUND CARE VISIT-LEV 5 EST PT Modifier: Quantity: 1 Physician Procedures CPT4 Code: [id number] Description: [phone] - WC PHYS LEVEL 2 - EST PT ICD-10 Diagnosis Description L89.312 Pressure ulcer of right buttock, stage 2 L89.322 Pressure ulcer of left buttock, stage 2 L97.111 Non-pressure chronic ulcer of right thigh limited to breakd B35.4 Tinea  corporis Modifier: own of skin Quantity: 1 Electronic Signature(s) Signed: 02/09/2018 8:05:08 AM By: Linton Ham MD Previous Signature: 02/08/2018 3:44:50 PM Version By: Montey Hora Entered By: Linton Ham on 02/08/2018 20:40:04

## 2018-05-13 DEATH — deceased

## 2021-10-16 ENCOUNTER — Other Ambulatory Visit: Payer: Self-pay | Admitting: Hematology and Oncology
